# Patient Record
Sex: Female | Born: 1935 | Race: White | Hispanic: No | Marital: Married | Smoking: Former smoker
Health system: Southern US, Community
[De-identification: ages and names within clinical notes are randomized; demographics above are authoritative.]

## PROBLEM LIST (undated history)

## (undated) ENCOUNTER — Emergency Department (HOSPITAL_COMMUNITY): Admission: EM | Payer: Medicare Other | Source: Home / Self Care

## (undated) DIAGNOSIS — K219 Gastro-esophageal reflux disease without esophagitis: Secondary | ICD-10-CM

## (undated) DIAGNOSIS — I219 Acute myocardial infarction, unspecified: Secondary | ICD-10-CM

## (undated) DIAGNOSIS — N183 Chronic kidney disease, stage 3 unspecified: Secondary | ICD-10-CM

## (undated) DIAGNOSIS — D649 Anemia, unspecified: Secondary | ICD-10-CM

## (undated) DIAGNOSIS — I779 Disorder of arteries and arterioles, unspecified: Secondary | ICD-10-CM

## (undated) DIAGNOSIS — N186 End stage renal disease: Secondary | ICD-10-CM

## (undated) DIAGNOSIS — I150 Renovascular hypertension: Secondary | ICD-10-CM

## (undated) DIAGNOSIS — I2510 Atherosclerotic heart disease of native coronary artery without angina pectoris: Secondary | ICD-10-CM

## (undated) DIAGNOSIS — I739 Peripheral vascular disease, unspecified: Secondary | ICD-10-CM

## (undated) DIAGNOSIS — E785 Hyperlipidemia, unspecified: Secondary | ICD-10-CM

## (undated) DIAGNOSIS — Z951 Presence of aortocoronary bypass graft: Secondary | ICD-10-CM

## (undated) DIAGNOSIS — R942 Abnormal results of pulmonary function studies: Secondary | ICD-10-CM

## (undated) DIAGNOSIS — I420 Dilated cardiomyopathy: Secondary | ICD-10-CM

## (undated) DIAGNOSIS — J189 Pneumonia, unspecified organism: Secondary | ICD-10-CM

## (undated) DIAGNOSIS — I10 Essential (primary) hypertension: Secondary | ICD-10-CM

## (undated) DIAGNOSIS — I509 Heart failure, unspecified: Secondary | ICD-10-CM

## (undated) DIAGNOSIS — I4891 Unspecified atrial fibrillation: Secondary | ICD-10-CM

## (undated) HISTORY — DX: Atherosclerotic heart disease of native coronary artery without angina pectoris: I25.10

## (undated) HISTORY — PX: TONSILLECTOMY: SUR1361

## (undated) HISTORY — PX: ABDOMINAL HYSTERECTOMY: SHX81

## (undated) HISTORY — DX: Hyperlipidemia, unspecified: E78.5

## (undated) HISTORY — DX: Peripheral vascular disease, unspecified: I73.9

## (undated) HISTORY — PX: BACK SURGERY: SHX140

## (undated) HISTORY — DX: Dilated cardiomyopathy: I42.0

## (undated) HISTORY — DX: Presence of aortocoronary bypass graft: Z95.1

## (undated) HISTORY — DX: Essential (primary) hypertension: I10

## (undated) HISTORY — DX: End stage renal disease: N18.6

## (undated) HISTORY — PX: COLONOSCOPY: SHX174

## (undated) HISTORY — DX: Abnormal results of pulmonary function studies: R94.2

## (undated) HISTORY — PX: APPENDECTOMY: SHX54

## (undated) HISTORY — PX: OTHER SURGICAL HISTORY: SHX169

## (undated) HISTORY — DX: Renovascular hypertension: I15.0

---

## 1979-05-24 HISTORY — PX: OTHER SURGICAL HISTORY: SHX169

## 1995-05-24 DIAGNOSIS — I739 Peripheral vascular disease, unspecified: Secondary | ICD-10-CM

## 1995-05-24 HISTORY — PX: OTHER SURGICAL HISTORY: SHX169

## 1995-05-24 HISTORY — DX: Peripheral vascular disease, unspecified: I73.9

## 2002-06-04 ENCOUNTER — Other Ambulatory Visit: Admission: RE | Admit: 2002-06-04 | Discharge: 2002-06-04 | Payer: Self-pay | Admitting: Obstetrics and Gynecology

## 2003-07-30 ENCOUNTER — Other Ambulatory Visit: Admission: RE | Admit: 2003-07-30 | Discharge: 2003-07-30 | Payer: Self-pay | Admitting: Obstetrics and Gynecology

## 2006-02-27 ENCOUNTER — Encounter (INDEPENDENT_AMBULATORY_CARE_PROVIDER_SITE_OTHER): Payer: Self-pay | Admitting: Specialist

## 2006-02-27 ENCOUNTER — Ambulatory Visit (HOSPITAL_COMMUNITY): Admission: RE | Admit: 2006-02-27 | Discharge: 2006-02-27 | Payer: Self-pay | Admitting: Gastroenterology

## 2006-04-22 HISTORY — PX: CARDIAC CATHETERIZATION: SHX172

## 2006-04-22 HISTORY — PX: CORONARY ARTERY BYPASS GRAFT: SHX141

## 2006-04-27 ENCOUNTER — Inpatient Hospital Stay (HOSPITAL_COMMUNITY): Admission: RE | Admit: 2006-04-27 | Discharge: 2006-05-01 | Payer: Self-pay | Admitting: Surgery

## 2006-05-30 ENCOUNTER — Encounter: Admission: RE | Admit: 2006-05-30 | Discharge: 2006-05-30 | Payer: Self-pay | Admitting: Surgery

## 2006-06-01 ENCOUNTER — Encounter (HOSPITAL_COMMUNITY): Admission: RE | Admit: 2006-06-01 | Discharge: 2006-08-30 | Payer: Self-pay | Admitting: Cardiology

## 2006-08-31 ENCOUNTER — Encounter (HOSPITAL_COMMUNITY): Admission: RE | Admit: 2006-08-31 | Discharge: 2006-09-01 | Payer: Self-pay | Admitting: Cardiology

## 2007-05-15 ENCOUNTER — Ambulatory Visit: Payer: Self-pay | Admitting: Vascular Surgery

## 2008-05-09 ENCOUNTER — Ambulatory Visit: Payer: Self-pay | Admitting: Vascular Surgery

## 2009-07-09 ENCOUNTER — Ambulatory Visit: Payer: Self-pay | Admitting: Vascular Surgery

## 2009-09-11 ENCOUNTER — Emergency Department (HOSPITAL_COMMUNITY): Admission: EM | Admit: 2009-09-11 | Discharge: 2009-09-11 | Payer: Self-pay | Admitting: Emergency Medicine

## 2010-06-13 ENCOUNTER — Encounter: Payer: Self-pay | Admitting: Surgery

## 2010-07-02 ENCOUNTER — Ambulatory Visit: Payer: Self-pay

## 2010-07-02 ENCOUNTER — Other Ambulatory Visit: Payer: Self-pay

## 2010-07-08 ENCOUNTER — Other Ambulatory Visit: Payer: Self-pay

## 2010-07-08 ENCOUNTER — Ambulatory Visit: Payer: Self-pay

## 2010-08-19 ENCOUNTER — Encounter (INDEPENDENT_AMBULATORY_CARE_PROVIDER_SITE_OTHER): Payer: Medicare Other

## 2010-08-19 ENCOUNTER — Other Ambulatory Visit (INDEPENDENT_AMBULATORY_CARE_PROVIDER_SITE_OTHER): Payer: Medicare Other

## 2010-08-19 ENCOUNTER — Ambulatory Visit (INDEPENDENT_AMBULATORY_CARE_PROVIDER_SITE_OTHER): Payer: Medicare Other

## 2010-08-19 DIAGNOSIS — I6529 Occlusion and stenosis of unspecified carotid artery: Secondary | ICD-10-CM

## 2010-08-19 DIAGNOSIS — I739 Peripheral vascular disease, unspecified: Secondary | ICD-10-CM

## 2010-08-19 DIAGNOSIS — I70219 Atherosclerosis of native arteries of extremities with intermittent claudication, unspecified extremity: Secondary | ICD-10-CM

## 2010-08-19 DIAGNOSIS — Z48812 Encounter for surgical aftercare following surgery on the circulatory system: Secondary | ICD-10-CM

## 2010-08-19 NOTE — Assessment & Plan Note (Signed)
OFFICE VISIT  Patricia, Hebert DOB:  26-Sep-1935                                       08/19/2010 [id number]#:[id number]  Patient is here today for follow-up of her right fem-pop bypass, which was done in December 1997 by Dr. Hart Rochester.  She had bypass done with nonreversed translocated greater saphenous vein.  She denies any symptoms of limiting claudication.  She states she cannot walk for long distances without getting a little bit of calf pain.  Otherwise she is doing quite well with her normal everyday activities.  She denies any rest pain, night pain.  She has been having issues with cholesterol- lowering medications.  Some, she is having a more severe reaction than others.  She is still on Zetia, and her cholesterol seems to be well- controlled.  The patient also states that some of her limitations at long distances are because of her coronary artery disease and shortness of breath with exertion.  Otherwise she is very active and doing well. We also follow her right carotid stenosis.  She denies any signs or symptoms of stroke, TIA, or amaurosis.  VASCULAR LAB:  Her carotid ultrasound was stable with a 40% to 59% stenosis in the right carotid with an end-diastolic velocity of 40 and no significant hemodynamic changes on the left.  Her ABI's, the right is 1, and the left is 0.87.  PHYSICAL EXAMINATION:  This is a well-developed, well-nourished woman in no acute distress.  She is alert and oriented x3.  She ambulates with a normal gait.  Vital signs:  Her heart rate is 76.  Her sats are 100, and respiratory rate is 10.  Bilateral lower extremities are warm and pink. She has a palpable dorsalis pedis pulse on the right and a monophasic Doppler signal in the DP and PT on the left.  She has no skin ulcers or skin breakdown in either lower extremity.  In listening to her neck, she has no carotid bruits.  Neurologically, she is alert and oriented x3. She has  good and equal strength in bilateral upper and lower extremities but no facial droop.  No tongue deviation.  ASSESSMENT/PLAN:  Widely patent right femoropopliteal bypass done in 1997 with ABIs of 1 and no history of claudication.  She does have some occasionally claudication on the left with very long distances.  Her carotid stenosis on the right is stable at 40% to 59%, and she is asymptomatic.  Patricia Goo, PA-C  Patricia E. Fields, MD Electronically Signed  RR/MEDQ  D:  08/19/2010  T:  08/19/2010  Job:  [id number]

## 2010-08-19 NOTE — Assessment & Plan Note (Signed)
OFFICE VISIT  [name], [name] DOB:  1935-12-16                                       08/19/2010 [id number]#:[id number]  ADDENDUM:  ASSESSMENT:  Stable right femoropopliteal bypass and stable carotid disease on the right at 40% to 59% which is asymptomatic.  PLAN:  Have the patient come back in 1 year for ABIs and also carotid studies.  Della Goo, PA-C  Charles E. Fields, MD Electronically Signed  RR/MEDQ  D:  08/19/2010  T:  08/19/2010  Job:  [id number]

## 2010-08-26 NOTE — Procedures (Unsigned)
CAROTID DUPLEX EXAM  INDICATION:  Follow up carotid artery disease.  HISTORY: Diabetes:  No. Cardiac:  CAD, MI. Hypertension:  Yes. Smoking:  Previous. Previous Surgery:  No. CV History:  Currently asymptomatic. Amaurosis Fugax No, Paresthesias No, Hemiparesis No.                                      RIGHT             LEFT Brachial systolic pressure:         157               153 Brachial Doppler waveforms:         WNL               WNL Vertebral direction of flow:        Antegrade         Antegrade DUPLEX VELOCITIES (cm/sec) CCA peak systolic                   80                75 ECA peak systolic                   126               137 ICA peak systolic                   130               85 ICA end diastolic                   40                22 PLAQUE MORPHOLOGY:                  Heterogenous      Heterogenous PLAQUE AMOUNT:                      Moderate          Moderate PLAQUE LOCATION:                    CCA/ICA/ECA       CCA/ICA/ECA  IMPRESSION: 1. 40% to 59% stenosis, right internal carotid artery. 2. Left internal carotid artery shows no evidence of hemodynamically     significant stenosis. 3. Bilateral intimal thickening in the common carotid arteries. 4. Study is stable compared to previous.  ___________________________________________ Quita Skye Hart Rochester, M.D.  OD/MEDQ  D:  08/20/2010  T:  08/20/2010  Job:  [id number]

## 2010-10-05 NOTE — Procedures (Signed)
CAROTID DUPLEX EXAM   INDICATION:  Follow-up carotid artery disease.   HISTORY:  Diabetes:  No.  Cardiac:  CAD, MI.  Hypertension:  Yes.  Smoking:  Quit.  Previous Surgery:  Right femoral to popliteal artery bypass on  04/29/1996 by Dr. Hart Rochester.  CV History:  No.  Amaurosis Fugax No, Paresthesias No, Hemiparesis No.                                       RIGHT             LEFT  Brachial systolic pressure:         178               186  Brachial Doppler waveforms:         Within normal limits                Within normal limits  Vertebral direction of flow:        Antegrade         Antegrade  DUPLEX VELOCITIES (cm/sec)  CCA peak systolic                   109               121  ECA peak systolic                   146               187  ICA peak systolic                   172               121  ICA end diastolic                   35                32  PLAQUE MORPHOLOGY:                  Heterogeneous     Heterogeneous  PLAQUE AMOUNT:                      Moderate          Moderate  PLAQUE LOCATION:                    CCA, BIF, ECA and ICA               CCA, BIF, ECA and ICA   IMPRESSION:  1. Duplex shows evidence of 40-59% stenosis of the right internal      carotid artery.  2. Duplex shows evidence of 40-59% stenosis of the left internal      carotid artery.   ___________________________________________  Quita Skye Hart Rochester, M.D.   AC/MEDQ  D:  05/09/2008  T:  05/09/2008  Job:  [id number]

## 2010-10-05 NOTE — Procedures (Signed)
CAROTID DUPLEX EXAM   INDICATION:  Carotid disease.   HISTORY:  Diabetes:  No.  Cardiac:  CAD, MI.  Hypertension:  Yes.  Smoking:  Previous.  Previous Surgery:  No carotid surgeries.  CV History:  Currently asymptomatic.  Amaurosis Fugax No, Paresthesias No, Hemiparesis No                                       RIGHT             LEFT  Brachial systolic pressure:         147               148  Brachial Doppler waveforms:         Normal            Normal  Vertebral direction of flow:        Antegrade         Antegrade  DUPLEX VELOCITIES (cm/sec)  CCA peak systolic                   95                101  ECA peak systolic                   143               155  ICA peak systolic                   152               96  ICA end diastolic                   30                23  PLAQUE MORPHOLOGY:                  Mixed             Mixed  PLAQUE AMOUNT:                      Moderate          Mild  PLAQUE LOCATION:                    ICA / ECA / CCA   ICA / ECA / CCA   IMPRESSION:  1. 40%-59% stenosis of the right internal carotid artery.  2. 1%-39% stenosis of the left internal carotid artery.  3. Stable bilateral carotid artery Doppler velocities noted when      compared to the previous exam on 05/09/2008 and with other prior      studies.   ___________________________________________  Quita Skye Hart Rochester, M.D.   CH/MEDQ  D:  07/09/2009  T:  07/09/2009  Job:  [id number]

## 2010-10-05 NOTE — Procedures (Signed)
BYPASS GRAFT EVALUATION   INDICATION:  Follow-up right lower extremity bypass graft.   HISTORY:  Diabetes:  No.  Cardiac:  CAD, MI.  Hypertension:  Yes.  Smoking:  No.  Previous Surgery:  Right femoral-to-popliteal artery bypass graft on  04/29/1996 by Dr. Hart Rochester.   SINGLE LEVEL ARTERIAL EXAM                               RIGHT              LEFT  Brachial:                    178                186  Anterior tibial:             175                157  Posterior tibial:            173                154  Peroneal:  Ankle/brachial index:        0.94               0.84   PREVIOUS ABI:  Date: 05/15/2007  RIGHT:  1.0  LEFT:  0.77   LOWER EXTREMITY BYPASS GRAFT DUPLEX EXAM:   DUPLEX:  1. Patent right femoral-to-popliteal artery bypass with no evidence of      stenosis.  2. Triphasic duplex waveform noted throughout the graft in outflow      artery.   IMPRESSION:   ___________________________________________  Quita Skye. Hart Rochester, M.D.   AC/MEDQ  D:  05/09/2008  T:  05/09/2008  Job:  [id number]

## 2010-10-05 NOTE — Procedures (Signed)
BYPASS GRAFT EVALUATION   INDICATION:  Followup, right lower extremity bypass graft.   HISTORY:  Diabetes:  No.  Cardiac:  CAD, MI.  Hypertension:  Yes.  Smoking:  No.  Previous Surgery:  Right femoral-popliteal artery bypass graft on  04/29/1996 by Dr. Hart Rochester.   SINGLE LEVEL ARTERIAL EXAM                               RIGHT              LEFT  Brachial:                    159                166  Anterior tibial:             159                149  Posterior tibial:            157                148  Peroneal:                    166                152  Ankle/brachial index:        1.0                0.92   PREVIOUS ABI:  Date: 04/25/2006  RIGHT:  0.89  LEFT:  0.77   LOWER EXTREMITY BYPASS GRAFT DUPLEX EXAM:   DUPLEX:  Doppler arterial waveforms appear triphasic proximal to,  within, and distal to bypass graft.   IMPRESSION:  1. Bilateral ankle brachial indices appear stable, slightly improved      from previous study.  2. Right femoral-popliteal artery bypass graft appears patent.   ___________________________________________  Quita Skye. Hart Rochester, M.D.   AS/MEDQ  D:  05/15/2007  T:  05/16/2007  Job:  [id number]

## 2010-10-05 NOTE — Procedures (Signed)
CAROTID DUPLEX EXAM   INDICATION:  Followup, carotid artery disease.   HISTORY:  Diabetes:  No.  Cardiac:  CAD, MI.  Hypertension:  Yes.  Smoking:  Quit.  Previous Surgery:  CV History:  No.  Amaurosis Fugax No, Paresthesias No, Hemiparesis No                                       RIGHT             LEFT  Brachial systolic pressure:         159               166  Brachial Doppler waveforms:         Triphasic         Triphasic  Vertebral direction of flow:        Antegrade         Antegrade  DUPLEX VELOCITIES (cm/sec)  CCA peak systolic                   90                87  ECA peak systolic                   131               177  ICA peak systolic                   143               108  ICA end diastolic                   39                31  PLAQUE MORPHOLOGY:                  Calcified         Calcified  PLAQUE AMOUNT:                      Mild/moderate     Mild  PLAQUE LOCATION:                    ICA/ECA           ICA/ECA/CCA   IMPRESSION:  1. The right internal carotid artery shows evidence of 40-59% stenosis      (low end of range).  2. The left internal carotid artery shows evidence of 20-39% stenosis      (high end of range).  3. Left external carotid artery stenosis.  4. No significant changes from previous study on April 25, 2006.   ___________________________________________  Quita Skye. Hart Rochester, M.D.   AS/MEDQ  D:  05/15/2007  T:  05/16/2007  Job:  [id number]

## 2010-10-08 NOTE — Discharge Summary (Signed)
[name], [name]           ACCOUNT NO.:  [id number]   MEDICAL RECORD NO.:  [id number]          PATIENT TYPE:  INP   LOCATION:  2002                         FACILITY:  [facility]   PHYSICIAN:  Evelene Croon, M.D.     DATE OF BIRTH:  26-Sep-1935   DATE OF ADMISSION:  04/27/2006  DATE OF DISCHARGE:  04/30/2006                               DISCHARGE SUMMARY   PRIMARY ADMITTING DIAGNOSIS:  Severe vessel three-vessel coronary artery  disease.   ADDITIONAL/DISCHARGE DIAGNOSES:  1. Severe three-vessel coronary artery disease.  2. Hypertension.  3. Hyperlipidemia.  4. Peripheral vascular occlusive disease.  5. Remote history of tobacco abuse.  6. History of pericarditis in the past.   PROCEDURES:  1. Coronary artery bypass grafting x4 (left internal mammary artery to      the LAD, saphenous vein graft to the distal right coronary artery,      saphenous vein graft to the intermediate, saphenous vein graft to      diagonal).  2. Endoscopic vein harvest, left leg.   HISTORY:  The patient is a 76 year old white female who recently has  undergone several episodes of substernal chest pain and shortness of  breath occurring with exertion.  She underwent a Cardiolite scan which  showed inferolateral scar with mild peri-infarct ischemia.  She  subsequently underwent cardiac catheterization by Dr. Aleen Campi on  April 06, 2006, which showed severe three-vessel coronary artery  disease with a preserved left ventricular ejection fraction of 60%.  She  also was noted to have a 60-70% bilateral renal artery stenosis by  abdominal aortic arteriogram.  Because of these findings and her  symptoms, she was referred to Dr. Evelene Croon for consideration of  surgical revascularization.  Dr. Laneta Simmers exam the patient and reviewed  her films and felt that her best course of action would be to proceed  with surgical revascularization.  He explained the risks, benefits and  alternatives of the procedure to  the patient and she agreed to proceed  with surgery.   HOSPITAL COURSE:  [name] was admitted to [facility] on  April 27, 2006 and was taken to the operating room, where she  underwent CABG x4 as described in detail above, performed by Dr. Laneta Simmers.  She tolerated the procedure well and was transferred to the SICU in  stable condition.  Postoperatively, she has done well.  She was  extubated shortly after surgery.  Post-op day #1, her chest tubes and  hemodynamic monitoring devices were removed, and she was able to be  transferred to the floor.  She did have mild postoperative blood loss  anemia, which was treated conservatively and improved.  She has made  slow and steady progress, during her postoperative course.  She has been  started on a beta blocker, and the dose has been titrated upward.  She  also has been somewhat volume overloaded and has been aggressively  diuresed.  Her incisions are all healing well.  She has remained  afebrile, and all vital signs have been stable.  She has been ambulating  in the halls without difficulty and is tolerating  a regular diet.   LABORATORY:  Her most recent labs show a hemoglobin of 10, hematocrit  28.9, platelets 153, white count 13.8, sodium 133, potassium 3.9 which  has been supplemented, BUN 18, creatinine 1.2.  Her chest x-ray showed  bibasilar atelectasis and effusions.  A decision was made to hold off on  starting any ACE inhibitor  angiotensin receptor blocker, secondary to  her renal artery stenoses.  This will be followed up as an outpatient.  It is felt that if she continues to remain stable over the next 24  hours, she will hopefully be ready for discharge home by May 01, 2006.   DISCHARGE MEDICATIONS:  Are as follows:  1. Aspirin 325 mg daily.  2. Metoprolol 25 mg b.i.d.  3. Crestor 10 mg daily.  4. Lasix 40 mg daily x1 week.  5. Potassium 20 mEq daily x1 week.  6. She is to continue her home doses of  Prilosec, multivitamin, I-Vite      and Exforge.  7. Tylox 1 to 2, q.4 h. p.r.n. for pain.   DISCHARGE INSTRUCTIONS:  She is asked to refrain from driving, heavy  lifting or strenuous activity.  She may continue ambulating daily and  using her incentive spirometer.  She may shower daily and clean her  incisions with soap and water.  She will continue her same pre-operative  diet.   DISCHARGE FOLLOWUP:  She will need to make an appointment see Dr.  Aleen Campi in 2 weeks.  She will then follow up in the [facility] office in 3  weeks, and our office will contact her with an appointment.  In the  interim, if she experiences any problems or has questions, she is asked  to contact our office immediately.      Coral Ceo, P.A.      Evelene Croon, M.D.  Electronically Signed    GC/MEDQ  D:  04/30/2006  T:  04/30/2006  Job:  [id number]   cc:   Antionette Char, MD  Brooke Bonito, M.D.  [facility] Office

## 2010-10-08 NOTE — Op Note (Signed)
NAME:  [name], BRAU           ACCOUNT NO.:  [id number]   MEDICAL RECORD NO.:  [id number]          PATIENT TYPE:  AMB   LOCATION:  ENDO                         FACILITY:  [facility]   PHYSICIAN:  Anselmo Rod, M.D.  DATE OF BIRTH:  Sep 04, 1935   DATE OF PROCEDURE:  02/27/2006  DATE OF DISCHARGE:                                 OPERATIVE REPORT   PROCEDURE:  Colonoscopy with snare polypectomy x2 and cold biopsies x14.   ENDOSCOPIST:  Anselmo Rod, M.D.   INSTRUMENT USED:  Olympus video colonoscope.   INDICATIONS FOR PROCEDURE:  A 75 year old white female underwent a screening  colonoscopy to rule out colonic polyps, masses, etc.   PREPROCEDURE PREPARATION:  Informed consent was procured from the patient.  The patient fasted for 4 hours prior to the procedure and prepped with 20  OsmoPrep pills the night of and 12 OsmoPrep pills the morning of the  procedure. The risks and benefits of the procedure including a 10% missed  rate of cancer and polyp were discussed with the patient as well.   PREPROCEDURE PHYSICAL:  The patient had stable vital signs. Neck supple.  Chest clear to auscultation. S1, S2 regular. Abdomen soft with normal bowel  sounds.   DESCRIPTION OF PROCEDURE:  The patient was placed in the left lateral  decubitus position, sedated with 100 mcg of fentanyl and 7.5 mg of Versed in  slow incremental doses given intravenously. Once the patient was adequately  sedated and maintained on low flow oxygen and continuous cardiac monitoring,  the Olympus video colonoscope was advanced from the rectum to the cecum.  Multiple washings were done, a nodular lesion was noted in the cecal base  close to the appendiceal orifice that was removed by a snare polypectomy x2  and cold biopsies x4. Two polyps were biopsied from the rectum (cold  biopsies x4). A small sessile polyp was biopsied over the IC valve x4. There  were a few early scattered diverticula noted throughout the  colon,  retroflexion in the rectum revealed no abnormalities. The patient tolerated  the procedure well without complications.   IMPRESSION:  1. Rectal polyps biopsied.  2. Scattered diverticulosis.  3. Small polyp biopsied over the ileocecal valve.  4. Nodular lesion snared and biopsied from the cecal base.   RECOMMENDATIONS:  1. Await pathology results.  2. Avoid all nonsteroidals including aspirin for the next 2 days.  3. Brochures on diverticulosis have been given to the patient for      education.  4. Repeat colonoscopy depending on pathology results.  5. Outpatient followup as need arises in the future.      Anselmo Rod, M.D.  Electronically Signed     JNM/MEDQ  D:  02/27/2006  T:  02/28/2006  Job:  [id number]   cc:   Brooke Bonito, M.D.  Cynthia P. Romine, M.D.

## 2010-10-08 NOTE — H&P (Signed)
[name], [name]           ACCOUNT NO.:  [id number]   MEDICAL RECORD NO.:  [id number]          PATIENT TYPE:  INP   LOCATION:  NA                           FACILITY:  [facility]   PHYSICIAN:  [name], M.D.     DATE OF BIRTH:  18-Oct-1935   DATE OF ADMISSION:  04/26/2006  DATE OF DISCHARGE:                              HISTORY & PHYSICAL   REASON FOR ADMISSION:  Severe three-vessel coronary artery disease.   CLINICAL HISTORY:  This patient is a 75 year old white female referred  by Dr. Charolette Hebert for consideration of coronary bypass graft surgery.  She has a history of hypertension and hyperlipidemia as well as  peripheral vascular disease and presented with several episodes of  substernal chest pain and shortness of breath occurring with exertion.  She underwent a Cardiolite scan on 03/30/06 which showed inferolateral  scar with mild peri-infarct ischemia.  Review of her electrocardiogram  since 1997 showed loss of inferior forces from 12/2005 to 03/2006.  She  underwent cardiac catheterization by Dr. Aleen Hebert on 04/06/06 at the  [facility].  This showed severe three-vessel  disease.  The LAD has 90% focal mid-vessel stenosis.  The first diagonal  branch has a large vessel at 90% ostial stenosis.  The left circumflex  gave off a large intermediate or first marginal that had 90% proximal  stenosis and an 80% mid stenosis.  The remainder of the left circumflex  had no significant disease.  The right coronary artery had a 50% ostial  lesion and then diffuse 90% proximal stenosis.  It was occluded in the  mid segment just beyond the right ventricular branch with bridging  collaterals from the mid right coronary artery and from the left filling  the distal vessel faintly.  The left internal mammary artery appeared  normal.  Abdominal aortic __________ showed 60-70% bilateral renal  artery stenosis at the origins.  Left ventricular ejection fraction was  60% with no mitral regurgitation and no gradient across the aortic  valve.   HER REVIEW OF SYSTEMS IS AS FOLLOWS:  GENERAL:  She denies any fever or  chills.  She has had some recent weight gain due to inactivity.  She  denies fatigue.  EYES:  She has had cataract surgery.  ENT:  Negative.  ENDOCRINE:  She denies diabetes and hypothyroidism.  CARDIOVASCULAR:  She does report intermittent substernal chest pressure  and pain as well as shortness of breath with exertion.  She has had no  PND or orthopnea.  She denies palpitations.  She denies peripheral  edema.  RESPIRATORY:  She has had some productive cough several weeks ago and  was treated with 14 days of amoxicillin with resolution of that.  She  subsequently developed a cold which has resolved.  GI:  She has reflux symptoms.  She denies nausea or vomiting.  She has  had no dysphagia.  She denies melena and bright red blood per rectum.  GU:  She denies dysuria and hematuria.  VASCULAR:  She denies claudication and phlebitis.  She did have a right  femoral popliteal bypass in  the past by Dr. Hart Hebert.  NEUROLOGICAL:  She denies any focal weakness or numbness.  She has had  some dizziness.  She has never had a TIA or a stroke.  MUSCULOSKELETAL:  She does have arthralgias and some muscle pain.  PSYCHIATRIC:  Negative.  HEMATOLOGICAL:  Negative.   ALLERGIES:  SHE DOES HAVE POSSIBLE ALLERGY TO LATEX HAVING HAD RASH  DEVELOP ON HER HANDS AFTER USING LATEX GLOVES.  SHE HAS NEVER HAD  ANAPHYLAXIS WITH LATEX.   PAST MEDICAL HISTORY:  Significant for:  1. Cataract surgery in the past.  2. She is status post surgery for a rectovaginal fistula.  3. She is status post microdiskectomy on her lower back.  4. She is status post right SFA to below-knee popliteal bypass using      saphenous vein in December 1997 by Dr. Hart Hebert.  5. She is status post total hysterectomy in 1981.  6. She is status post tubal ligation.  7. She is status post  appendectomy.  8. Status post tonsillectomy.  9. She has a history of pericarditis in the past.  10.She also has a history of hypercholesterolemia and hypertension as      mentioned above.   SOCIAL HISTORY:  She is retired.  She lives with her husband.  She quit  smoking about 14 years ago.  She drinks about 2 glasses of red wine per  day.   FAMILY HISTORY:  Negative for coronary artery disease.  One brother was  born with a patent duct that was closed.   MEDICATIONS:  1. Exforge 5 mg daily.  2. HCTZ 25 mg daily.  3. Lopressor ER 25 mg b.i.d.  4. Crestor 10 mg daily.  5. Aspirin 325 mg daily.  6. Omega-3 fish oil, 1000 mg daily.  7. Centrum Silver daily.  8. Prilosec daily.  9. Eyerite protect, 2 daily.  10.Sublingual nitroglycerine p.r.n.  11.Betamethasone/DP 0.05% cream p.r.n.   ON PHYSICAL EXAMINATION:  VITAL SIGNS:  Blood pressure 162/70.  Pulse 88  and regular.  Respiratory rate 18 and unlabored.  GENERAL:  She is a well-developed white female in no distress.  HEENT EXAM:  Head is normocephalic and atraumatic.  Pupils are equal and  reactive to light and accommodation.  Extraocular muscles are intact.  Her throat is clear.  NECK EXAM:  Shows normal carotid pulses bilaterally.  There are no  bruits.  There is no adenopathy or thyromegaly.  CARDIAC EXAM:  Shows regular rate and rhythm with normal S1 and S2.  There is no murmur or gallop.  LUNGS:  Clear.  ABDOMINAL EXAM:  Shows active bowel sounds.  Abdomen is soft, mildly  obese, and nontender.  There are no palpable masses or organomegaly.  EXTREMITY EXAM:  Shows no peripheral edema.  Right leg has several scars  from her peripheral bypass surgery.  Pedal pulses are palpable  bilaterally.  SKIN:  Warm and dry.  NEUROLOGIC EXAM:  Shows her to be alert and oriented x3.  Motor and  sensory exams grossly normal. PERIPHERAL VASCULAR EXAM:  Shows an API of 0.97 on the right and 0.79 on  the left.  Her carotid Doppler exam  shows a 40-59% right internal  carotid artery stenosis and 1-39% left internal carotid artery stenosis.  Upper extremity arterial flow is normal.   IMPRESSION:  [name] has severe three-vessel coronary disease with  a large __________ myocardium at risk.  She has had recurrent anginal  symptoms and a positive Cardiolite scan.  I agree that coronary artery  bypass graft surgery is the best treatment to prevent further ischemia  and infarction.  She has had right lower extremity bypass with vein, so  we would only be able to use her left leg vein.  I discussed the  procedure with her and her husband including alternatives, benefits, and  risks including but not limited to bleeding, blood transfusion,  infection, stroke, myocardial infarction, graft failure, and death.  I  also told her that if her left leg vein was not suitable, we may need to  use her left radial artery and possibly bilateral internal mammary  grafts to provide adequate vascularization.  She understands all of this  and agrees to proceed.  We will schedule this for April 27, 2006.      [name], M.D.  Electronically Signed     BB/MEDQ  D:  04/26/2006  T:  04/26/2006  Job:  [id number]   cc:   Antionette Char, MD

## 2010-10-08 NOTE — Op Note (Signed)
NAMENEILA, TEEM           ACCOUNT NO.:  [id number]   MEDICAL RECORD NO.:  [id number]          PATIENT TYPE:  INP   LOCATION:  2002                         FACILITY:  [facility]   PHYSICIAN:  Evelene Croon, M.D.     DATE OF BIRTH:  Aug 18, 1935   DATE OF PROCEDURE:  04/27/2006  DATE OF DISCHARGE:                               OPERATIVE REPORT   PREOPERATIVE DIAGNOSIS:  Severe three-vessel coronary artery disease.   POSTOPERATIVE DIAGNOSIS:  Severe three-vessel coronary artery disease.   OPERATIVE PROCEDURES:  Median sternotomy, extracorporeal circulation,  coronary artery bypass graft surgery times four using a left internal  mammary artery graft to the left anterior descending coronary artery  with a saphenous vein graft to the diagonal branch of the left anterior  descending, a saphenous vein graft to the intermediate coronary artery  and a saphenous vein graft to the right coronary artery; endoscopic vein  harvesting from the left leg.   ATTENDING SURGEON:  Evelene Croon, M.D.   ASSISTANT:  Stephanie Acre Dominick, PA-C.   ANESTHESIA:  General endotracheal.   CLINICAL HISTORY:  This patient is a 75 year old woman, who has been  followed by Dr. Charolette Child with a history of hypertension and  hyperlipidemia, as well as peripheral vascular disease.  She presented  with several episodes of substernal chest pain and shortness of breath  with exertion.  She had a Cardiolite scan on 03/30/2006 that showed  inferolateral scar with mild peri-infarct ischemia.  She underwent  cardiac catheterization on 03/27/2006, which showed severe 3-vessel  disease.  The LAD had 90% focal midvessel stenosis.  A large first  diagonal branch had a 90% ostial stenosis.  The left circumflex gave off  a large intermediate or first marginal that had a 90% proximal stenosis  and an 80% midstenosis.  The remainder of the left circumflex had no  significant disease.  The right coronary artery had 50% ostial  stenosis,  and then diffuse 90% proximal stenosis, and was occluded in the  midsegment just beyond the right ventricular branch, with bridging  collaterals showing the distal vessel on the right, we are anticipating  some collaterals on the left.  The left internal mammary artery appeared  to be normal.  Abdominal aortogram showed 60 the 70% bilateral renal  artery stenosis at the origins.  Ejection fraction was 60% with no  mitral regurgitation and no gradient across the aortic valve.  After  review of the angiogram and examination of the patient, it was felt that  coronary artery bypass grafting was the best treatment to prevent  further ischemia and infarction.  I discussed the operative procedure  with the patient and her husband, including alternatives, benefits and  risks, including bleeding, blood transfusion, infection, stroke,  myocardial infarction, graft failure and death.  They understood and  agreed to proceed.   DESCRIPTION OF OPERATIVE PROCEDURES:  The patient was taken to the  operating room and placed on the table in supine position.  After the  induction of general endotracheal anesthesia, a Foley catheter was  placed in the bladder using sterile technique.  Then, the chest,  abdomen  and both the lower extremities were prepped and draped in the usual  sterile manner.  The patient did give a history of a Latex allergy with  a rash on her hands after wearing Latex gloves; and therefore, a Latex-  free protocol was used.   Then, the chest was opened through a median sternotomy incision.  The  pericardium was opened in the midline.  Examination of the heart showed  good ventricular contractility.  The ascending aorta had no palpable  plaques in it.   Then, the left internal mammary artery was harvested from the chest wall  as a pedicle graft.  This was a medium-caliber vessel with excellent  blood flow through it.  At the same time, a segment of greater saphenous  vein  was harvested from the left leg using endoscopic vein harvest  technique.  This vein was of medium caliber and good quality.   Then, the patient was heparinized, and when an adequate activated  clotting time was achieved, the distal ascending aorta was cannulated  using a 20-French aortic cannula for arterial inflow.  Venous outflow  was achieved using a 2-stage venous cannula through the right atrial  appendage.  An antegrade cardioplegia and vent cannula was inserted in  the aortic root.   The patient was then placed on cardiopulmonary bypass and the distal  coronaries identified.  The LAD was a large graftable vessel.  The  diagonal was a large graftable vessel.  The intermediate was a large  vessel that was intramyocardial along its entire extent.  It was visible  running on the surface of the muscle.  It had no distal disease in it.  The right coronary artery was heavily diseased proximally, but distally,  beyond the acute margin, the vessel was fairly soft, without significant  disease.   Then, the aorta was crossclamped and 1000 cc of cold-blood antegrade  cardioplegia was administered into the aortic root with quick arrest of  the heart.  Systemic hypothermia to 28 degrees Celsius and topical  hypothermia with ice saline were used.  A temperature probe was placed  in the septum and an insulating pad on the pericardium.   Then, the first distal anastomosis was performed at the intermediate  coronary artery.  The internal diameter was about 2.5 mm.  The conduit  used was a segment of greater saphenous vein.  The anastomosis was  performed in end-to-side using continuous 7-0 Prolene suture.  Flow was  measured through the graft and was excellent.   The second distal anastomosis was performed to the distal right coronary  artery.  The internal diameter was greater than 2.5 mm.  The probe passed distally without any problem.  The conduit used was a second  segment of greater  saphenous vein, and the anastomosis performed in an  end-to-side manner using continuous 7-0 Prolene suture.  Flow was  measured through the graft and was excellent.  Then, another dose of  cardioplegia was given down the vein grafts and the aortic root.   A third distal anastomosis was performed to the diagonal branch.  The  internal diameter of this vessel was about 1.75 mm.  The conduit used  was a third segment of greater saphenous vein, and the anastomosis  performed in an end-to-side manner using continuous 7-0 Prolene suture.  Flow was measured through the graft and was excellent.   The fourth distal anastomosis was performed to the midportion of the  left anterior descending coronary  artery.  The internal diameter of this  vessel was about 2.5 mm.  The conduit used was a left internal mammary  graft, and this was brought out through an opening in the left  pericardium anterior to the phrenic nerve.  It was anastomosed to the  LAD in an end-to-side manner using continuous 8-0 Prolene suture.  The  pedicle was tacked to the epicardium with 6-0 Prolene sutures.  The  patient was rewarmed to 37 degrees Centigrade.  With the crossclamps in  place, the 3 proximal vein graft anastomoses were performed to the  aortic root in end-to-side manner using continuous 6-0 Prolene suture.  Then, the clamp was removed from the mammary pedicle.  There was rapid  warming of the ventricular septum and return of spontaneous ventricular  fibrillation.  The crossclamp was removed with a time of 64 minutes, and  the patient defibrillated and in a sinus rhythm.  The proximal and  distal anastomoses appeared hemostatic and the lie of the grafts  satisfactory.  Graft markers were placed around the proximal  anastomoses.  Two temporary right ventricular and right atrial pacing  wires were placed and brought out through the skin.   The patient was rewarmed to 37 degrees Centigrade.  She was weaned from   cardiopulmonary bypass on low-dose dopamine.  The total bypass time was  84 minutes.  Cardiac function appeared excellent with a cardiac output  of 5 L per minute.  Protamine was given, and the venous and aortic  cannulae were removed without difficulty.  Hemostasis was achieved.  The  patient was given 2 units of blood on pump.  Then, 3 chest tubes were  placed, a tube in the posterior pericardium and 1 in the anterior  mediastinum and 1 in the left pleural space.  The pericardium was  loosely reapproximated over the heart.  The sternum was closed with #6  stainless steel wires.  The fascia was closed with continuous #1 Vicryl  suture.  The subcutaneous tissue was closed with continuous 2-0 Vicryl,  and the skin with a 3-0 Vicryl subcuticular closure.  The lower  extremity vein harvest site was closed in layers in a similar manner.  The sponge, needle and instrument counts were correct according to the scrub nurse.  Dry sterile dressings were then applied over the incisions  and around the chest tubes, which were hooked to Pleur-evac suction.  The patient remained hemodynamically stable and was transferred to the  SICU in guarded, but stable condition.      Evelene Croon, M.D.  Electronically Signed     BB/MEDQ  D:  04/27/2006  T:  04/28/2006  Job:  [id number]   cc:   Antionette Char, MD  Cardiac Cath Lab

## 2011-06-13 HISTORY — PX: OTHER SURGICAL HISTORY: SHX169

## 2011-08-18 ENCOUNTER — Other Ambulatory Visit: Payer: No Typology Code available for payment source

## 2012-05-07 ENCOUNTER — Encounter: Payer: Self-pay | Admitting: Vascular Surgery

## 2012-06-27 ENCOUNTER — Other Ambulatory Visit (HOSPITAL_COMMUNITY): Payer: Self-pay | Admitting: Cardiology

## 2012-06-27 DIAGNOSIS — I2510 Atherosclerotic heart disease of native coronary artery without angina pectoris: Secondary | ICD-10-CM

## 2012-06-27 DIAGNOSIS — Z951 Presence of aortocoronary bypass graft: Secondary | ICD-10-CM

## 2012-07-05 ENCOUNTER — Encounter (HOSPITAL_COMMUNITY): Payer: No Typology Code available for payment source

## 2012-07-24 ENCOUNTER — Encounter (HOSPITAL_COMMUNITY): Payer: No Typology Code available for payment source

## 2012-08-14 ENCOUNTER — Encounter (HOSPITAL_COMMUNITY): Payer: No Typology Code available for payment source

## 2012-08-21 HISTORY — PX: NM MYOVIEW LTD: HXRAD82

## 2012-08-28 ENCOUNTER — Ambulatory Visit (HOSPITAL_COMMUNITY)
Admission: RE | Admit: 2012-08-28 | Discharge: 2012-08-28 | Disposition: A | Discharge status: Home / Self Care | Payer: Medicare Other | Source: Ambulatory Visit | Attending: Cardiology | Admitting: Cardiology

## 2012-08-28 DIAGNOSIS — R0989 Other specified symptoms and signs involving the circulatory and respiratory systems: Secondary | ICD-10-CM | POA: Insufficient documentation

## 2012-08-28 DIAGNOSIS — J449 Chronic obstructive pulmonary disease, unspecified: Secondary | ICD-10-CM | POA: Insufficient documentation

## 2012-08-28 DIAGNOSIS — I10 Essential (primary) hypertension: Secondary | ICD-10-CM | POA: Insufficient documentation

## 2012-08-28 DIAGNOSIS — E669 Obesity, unspecified: Secondary | ICD-10-CM | POA: Insufficient documentation

## 2012-08-28 DIAGNOSIS — I739 Peripheral vascular disease, unspecified: Secondary | ICD-10-CM | POA: Insufficient documentation

## 2012-08-28 DIAGNOSIS — I2510 Atherosclerotic heart disease of native coronary artery without angina pectoris: Secondary | ICD-10-CM | POA: Insufficient documentation

## 2012-08-28 DIAGNOSIS — Z87891 Personal history of nicotine dependence: Secondary | ICD-10-CM | POA: Insufficient documentation

## 2012-08-28 DIAGNOSIS — Z8249 Family history of ischemic heart disease and other diseases of the circulatory system: Secondary | ICD-10-CM | POA: Insufficient documentation

## 2012-08-28 DIAGNOSIS — Z951 Presence of aortocoronary bypass graft: Secondary | ICD-10-CM

## 2012-08-28 DIAGNOSIS — J4489 Other specified chronic obstructive pulmonary disease: Secondary | ICD-10-CM | POA: Insufficient documentation

## 2012-08-28 DIAGNOSIS — R0609 Other forms of dyspnea: Secondary | ICD-10-CM | POA: Insufficient documentation

## 2012-08-28 DIAGNOSIS — I252 Old myocardial infarction: Secondary | ICD-10-CM | POA: Insufficient documentation

## 2012-08-28 HISTORY — PX: CARDIOVASCULAR STRESS TEST: SHX262

## 2012-08-28 MED ORDER — TECHNETIUM TC 99M SESTAMIBI GENERIC - CARDIOLITE
30.9000 | Freq: Once | INTRAVENOUS | Status: AC | PRN
  Administered 2012-08-28: 30.9 via INTRAVENOUS

## 2012-08-28 MED ORDER — REGADENOSON 0.4 MG/5ML IV SOLN
0.4000 mg | Freq: Once | INTRAVENOUS | Status: AC
  Administered 2012-08-28: 0.4 mg via INTRAVENOUS

## 2012-08-28 MED ORDER — TECHNETIUM TC 99M SESTAMIBI GENERIC - CARDIOLITE
10.4000 | Freq: Once | INTRAVENOUS | Status: AC | PRN
  Administered 2012-08-28: 10 via INTRAVENOUS

## 2012-08-28 NOTE — Procedures (Addendum)
[place] [facility] [address] [place] [address] [phone]  Cardiology Nuclear Med Study  [name] is a 77 y.o. female     MRN : [id number]     DOB: [birth year]  Procedure Date: 08/28/2012  Nuclear Med Background Indication for Stress Test:  Graft Patency History:  COPD and CAD; MI/CABG-04/2006 Cardiac Risk Factors: Family History - CAD, History of Smoking, Hypertension, Lipids, Obesity and PVD  Symptoms:  DOE   Nuclear Pre-Procedure Caffeine/Decaff Intake:  10:00pm NPO After: 8:00am   IV Site: R Antecubital  IV 0.9% NS with Angio Cath:  22g  Chest Size (in):  N/A IV Started by: Emmit Pomfret, RN  Height: 5\' 3"  (1.6 m)  Cup Size: DD  BMI:  Body mass index is 33.49 kg/(m^2). Weight:  189 lb (85.73 kg)   Tech Comments:  N/A    Nuclear Med Study 1 or 2 day study: 1 day  Stress Test Type:  Lexiscan  Order Authorizing Provider:  Bryan Lemma, MD   Resting Radionuclide: Technetium 78m Sestamibi  Resting Radionuclide Dose: 10.4 mCi   Stress Radionuclide:  Technetium 77m Sestamibi  Stress Radionuclide Dose: 30.9 mCi           Stress Protocol Rest HR: 74 Stress HR: 93  Rest BP: 192/86 Stress BP: 165/80  Exercise Time (min): n/a METS: n/a   Predicted Max HR: 146 bpm % Max HR: 63.7 bpm Rate Pressure Product: 25366  Dose of Adenosine (mg):  n/a Dose of Lexiscan: 0.4 mg  Dose of Atropine (mg): n/a Dose of Dobutamine: n/a mcg/kg/min (at max HR)  Stress Test Technologist: Esperanza Sheets, CCT Nuclear Technologist: Gonzella Lex, CNMT   Rest Procedure:  Myocardial perfusion imaging was performed at rest 45 minutes following the intravenous administration of Technetium 31m Sestamibi. Stress Procedure:  The patient received IV Lexiscan 0.4 mg over 15-seconds.  Technetium 77m Sestamibi injected at 30-seconds.  There were no significant changes with Lexiscan.  Quantitative spect images were obtained after a 45 minute  delay.  Transient Ischemic Dilatation (Normal <1.22):  0.99 Lung/Heart Ratio (Normal <0.45):  0.30 QGS EDV:  72 ml QGS ESV:  27 ml LV Ejection Fraction: 63%  Signed by Gonzella Lex, CNMT  PHYSICIAN INTERPRETATION:  Rest ECG: NSR - Normal EKG and low voltage  Stress ECG: No significant change from baseline ECG  QPS Raw Data Images:  Mild breast attenuation.  Normal left ventricular size. Stress Images:  There is decreased uptake in the anteroapex. Rest Images:  There is decreased uptake in the anteroapex. Subtraction (SDS):  There is a fixed anteriour defect that is most consistent with breast attenuation.  There is normal wall motion in this region, suggesting artifact over a fixed defect from prior infarction.There is no evidence to suggest ischemia.    Impression Exercise Capacity:  Lexiscan with no exercise. BP Response:  Normal blood pressure response. Clinical Symptoms:  There is dyspnea. ECG Impression:  No significant ECG changes with Lexiscan.  LV Wall Motion:  NL LV Function; NL Wall Motion Comparison with Prior Nuclear Study: No significant change from previous study  Overall Impression:  Normal stress nuclear study with likely breast attenuation.;  Low risk stress nuclear study.    Marykay Lex, MD  08/28/2012 1:32 PM

## 2012-12-21 ENCOUNTER — Telehealth: Payer: Self-pay | Admitting: Vascular Surgery
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
## 2013-05-31 ENCOUNTER — Encounter: Payer: Self-pay | Admitting: Cardiology

## 2013-05-31 ENCOUNTER — Ambulatory Visit (INDEPENDENT_AMBULATORY_CARE_PROVIDER_SITE_OTHER): Payer: Medicare Other | Admitting: Cardiology

## 2013-05-31 VITALS — BP 158/76 | HR 71 | Ht 63.0 in | Wt 178.2 lb

## 2013-05-31 DIAGNOSIS — I10 Essential (primary) hypertension: Secondary | ICD-10-CM

## 2013-05-31 DIAGNOSIS — E785 Hyperlipidemia, unspecified: Secondary | ICD-10-CM

## 2013-05-31 DIAGNOSIS — I739 Peripheral vascular disease, unspecified: Secondary | ICD-10-CM

## 2013-05-31 DIAGNOSIS — I2510 Atherosclerotic heart disease of native coronary artery without angina pectoris: Secondary | ICD-10-CM

## 2013-05-31 NOTE — Patient Instructions (Signed)
INCREASE METOPROLOL 50 MG 1 TABLET BY MOUTH TWICE A DAY  DISCUSS WITH NEPHROLOGIST ABOUT CHANGING TO ANOTHER ARB VS TRYING AN ACE INHIBITOR   Stable from a cardiac standpoint   Your physician wants you to follow-up in 12 months Dr Herbie Baltimore.  You will receive a reminder letter in the mail two months in advance. If you don't receive a letter, please call our office to schedule the follow-up appointment.

## 2013-06-02 ENCOUNTER — Encounter: Payer: Self-pay | Admitting: Cardiology

## 2013-06-02 DIAGNOSIS — E785 Hyperlipidemia, unspecified: Secondary | ICD-10-CM | POA: Insufficient documentation

## 2013-06-02 DIAGNOSIS — I10 Essential (primary) hypertension: Secondary | ICD-10-CM | POA: Insufficient documentation

## 2013-06-02 DIAGNOSIS — I739 Peripheral vascular disease, unspecified: Secondary | ICD-10-CM | POA: Insufficient documentation

## 2013-06-02 MED ORDER — METOPROLOL TARTRATE 50 MG PO TABS: 50.0000 mg | ORAL_TABLET | Freq: Two times a day (BID) | ORAL | Status: DC

## 2013-06-02 NOTE — Progress Notes (Signed)
PATIENT: [name] MRN: [id number]  DOB: December 21, 1935   DOV:06/02/2013 PCP: Patricia Sites, MD  Clinic Note: Chief Complaint  Patient presents with  . Annual Exam    C/o rt leg pain-possibly gout?. Started taking Irbesartan ~3 weeks ago, she now has an itchy, red rash on both arms, around both ankles and legs will start itching.   HPI: [name] is a 78 y.o.  female with a PMH below who presents today for annual followup of her coronary disease. I last saw her in December 2013 nature doing relatively well. She is a former patient of Dr. Aleen Hebert. She a followup Myoview done in April of 2014 no negative for ischemia or infarction. Preserved EF. She has not had an echocardiogram done but has no abnormal exam findings to suggest that she would be one currently.  The major thing happened since her last visit was that she is not most of November in bed with what amounted to be an inner ear infection. She has several rounds of antibiotics, and is finally starting to feel back to normal.  As part of her being so sick, she lost almost 14 pounds. She has gained some of it back, but intends to keep most of it off.  Interval History: She presents today again doing fine overall for cardiac standpoint. She's had some difficulties however she change her antihypertensive regimen around because her insurance covers it covering the Diovan she was on. Continue his Diovan ACT has been covered as opposed to just simple Diovan. Regardless she was initially put on losartan, but stopped it due to rash, she's now been switched irbesartan and again 3 weeks after starting it has noting recurrence of her rash. From a cardiac standpoint she is doing fine without major complaints. No chest tightness or pressure with exertion.  No resting or exertional dyspnea.  The remainder of Cardiovascular ROS: negative for - edema, irregular heartbeat, loss of consciousness, murmur, orthopnea, palpitations, paroxysmal  nocturnal dyspnea, rapid heart rate or shortness of breath, lightheadedness, dizziness, wooziness, syncope/near syncope.  Negative for TIA/amaurosis fugax symptoms. Negative for melena, hematochezia or hematuria. No significant nosebleeds or claudication. Edema is well-controlled on low-dose Lasix. Additional cardiac review of systems:  Past Medical History  Diagnosis Date  . CAD in native artery  2007    Referred for CABG; ALT Myoview May 2008: No ischemia or infarction. Resolution of inferolateral scar. Normal EF.  . S/P CABG x 4 2007    LIMA-LAD, SVG-Distal RCA, SVG-Ramus, SVG-Diagonal; no echocardiogram done  . PAD (peripheral artery disease) 1997    Mild-moderate carotid disease; status post right SFA occlusion with Fem-Below Knee Pop bypass ([name])  . Renal artery stenosis, non-flow-limiting     Documented by Abdominal Aortic Angiogram  . Hypertension, essential   . Hyperlipidemia LDL goal < 70   . Chronic renal insufficiency, stage III (moderate)     Prior Cardiac Evaluation and Past Surgical History: Past Surgical History  Procedure Laterality Date  . Cardiac catheterization  December 2007    LAD-90% mid, D19 percent ostial. Circumflex-OM1 90% ostial, 80% mid. RCA 9% proximal, 100% mid  . Femoropopliteal bypass Right 1997    [name]: SFA-below knee Pop  . Coronary artery bypass graft  December 2007    LIMA-LAD, SVG-distal RCA, SVG-D1, SVG-OM1/Ramus  . Nm myoview ltd  April 2014    EF 63%, mild fixed anteroapical defect thought to be breast attenuation  . Appendectomy    . Tonsillectomy    .  Cataract surgery    . Rectovaginal fistula repair    . [place] bhl  1981    Allergies  Allergen Reactions  . Latex Swelling  . Statins Other (See Comments)    Diffuse cramping; has tried Lipitor, Crestor, Pravachol and simvastatin  . Benicar [Olmesartan] Itching and Rash    Current Outpatient Prescriptions  Medication Sig Dispense Refill  . allopurinol (ZYLOPRIM) 300 MG  tablet Take 300 mg by mouth daily.      Marland Kitchen. aspirin 325 MG tablet Take 325 mg by mouth daily.      . betamethasone dipropionate (DIPROLENE) 0.05 % cream Apply topically as needed.      . Choline Fenofibrate (FENOFIBRIC ACID) 45 MG CPDR Take 45 mg by mouth daily.      . Coenzyme Q10 (CO Q 10 PO) Take 400 mg by mouth every other day.      . colchicine 0.6 MG tablet Take 0.6 mg by mouth daily as needed.      . ezetimibe (ZETIA) 10 MG tablet Take 5 mg by mouth daily.      . furosemide (LASIX) 40 MG tablet Take 40 mg by mouth 2 (two) times daily.      . irbesartan (AVAPRO) 300 MG tablet Take 300 mg by mouth daily.      Marland Kitchen. LORazepam (ATIVAN) 0.5 MG tablet Take 0.5 mg by mouth 4 (four) times daily as needed for anxiety.      Marland Kitchen. losartan (COZAAR) 50 MG tablet Take 50 mg by mouth daily.      . Multiple Vitamin (MULTIVITAMIN) tablet Take 1 tablet by mouth daily.      . Multiple Vitamins-Minerals (ICAPS) CAPS Take 1 capsule by mouth 2 (two) times daily.      . Omega-3 Fatty Acids (OMEGA-3 FISH OIL PO) Take 1,500 mg by mouth daily.      Marland Kitchen. omeprazole (PRILOSEC) 20 MG capsule Take 20 mg by mouth daily.      . vitamin E 400 UNIT capsule Take 400 Units by mouth every other day.      . metoprolol (LOPRESSOR) 50 MG tablet Take 1 tablet (50 mg total) by mouth 2 (two) times daily.  180 tablet  3   No current facility-administered medications for this visit.   History   Social History Narrative   She is a married mother of 4 with one child that died at a young age. She has 11 grandchildren and 11 great grandchildren. She is not excessively active. She does get around the house. No routine exercise   She quit smoking in 1993, and has a social alcohol beverage on occasion.   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    ROS: A comprehensive Review of Systems - Negative except Some swelling and warmth of the right ankle, that she thinks is due to gout. Musculoskeletal ROS: positive  for - joint pain, joint swelling, pain in ankle - right and swelling in ankle - right  PHYSICAL EXAM BP 158/76  Pulse 71  Ht 5\' 3"  (1.6 m)  Wt 178 lb 3.2 oz (80.831 kg)  BMI 31.57 kg/m2 General appearance: alert and oriented x3, cooperative, appears younger than stated age and no distress; well-nourished, well-groomed. HEENT: Nelson/AT, EOMI, MMM, anicteric sclera Neck: no adenopathy, no carotid bruit, no JVD and supple, symmetrical, trachea midline Lungs: clear to auscultation bilaterally, normal percussion bilaterally and nonlabored, good air movement Heart: regular rate and rhythm, S1, S2 normal, no murmur,  click, rub or gallop and normal apical impulse Abdomen: soft, non-tender; bowel sounds normal; no masses,  no organomegaly Extremities: extremities normal, atraumatic, no cyanosis or edema and varicose veins noted; there is some mild tenderness and swelling right ankle. Pulses: 2+ and symmetric Neurologic: Grossly normal  [id number] today: Yes Rate: 71 , Rhythm: NSR with first-degree AV block;  PAC  with aberrancy PVC;   Recent Labs: None available  ASSESSMENT / PLAN: CAD in native artery - CABG x4 (LIMA-LAD, SVG-distal RCA, SVG-diagonal, SVG-Ramus Relatively asymptomatic from cardiac standpoint. She had a Myoview done last April that was relatively normal EF. She is not having any angina or heart failure symptoms. Besides this time that she is unable to do to intolerance, she is on aspirin, beta blocker and attempting to be on an ARB.  Hypertension, essential Her blood pressures that high today, thinks maybe a little out of sorts, probably she took her ARB today. She is quite concerned about this rash, to she also has almost olmesartan listed as an allergy.  Unfortunately she is doing very well on Diovan, that is not covered by insurance. Her primary physician wanted her to check with Dr. Briant Cedar from nephrology to determine if it was okay to restart an ARB. My question would be  to be either get her back on Diovan HCTZ, or simply switch her over to an ACE inhibitor?   As I will only be seen on an annual basis, would defer this to her primary physician and nephrologist. She can call her nephrologist's office today or Monday to ask about recommendations.    As I'm not sure what plans will be with ARB, I will at least increase her metoprolol to 50 mg twice a day from 25 twice a day.  PAD (peripheral artery disease) No complaints of claudication. My understanding is that she does follow up with [name].  Hyperlipidemia LDL goal < 70 She is on fenofibrate, at 45 mg per day. This is being followed by her primary physician. She still does have room to increase that to 135 mg if need be.    Orders Placed This Encounter  Procedures  . EKG 12-Lead    Followup: One year  DAVID W. Herbie Baltimore, M.D., M.S. [facility] [address] [place], [place]  [address]  [phone] Pager # [phone]

## 2013-06-02 NOTE — Assessment & Plan Note (Signed)
Relatively asymptomatic from cardiac standpoint. She had a Myoview done last April that was relatively normal EF. She is not having any angina or heart failure symptoms. Besides this time that she is unable to do to intolerance, she is on aspirin, beta blocker and attempting to be on an ARB.

## 2013-06-02 NOTE — Assessment & Plan Note (Signed)
She is on fenofibrate, at 45 mg per day. This is being followed by her primary physician. She still does have room to increase that to 135 mg if need be.

## 2013-06-02 NOTE — Assessment & Plan Note (Signed)
No complaints of claudication. My understanding is that she does follow up with Dr. Hart Rochester.

## 2013-06-02 NOTE — Assessment & Plan Note (Signed)
Her blood pressures that high today, thinks maybe a little out of sorts, probably she took her ARB today. She is quite concerned about this rash, to she also has almost olmesartan listed as an allergy.  Unfortunately she is doing very well on Diovan, that is not covered by insurance. Her primary physician wanted her to check with Dr. Briant Cedar from nephrology to determine if it was okay to restart an ARB. My question would be to be either get her back on Diovan HCTZ, or simply switch her over to an ACE inhibitor?   As I will only be seen on an annual basis, would defer this to her primary physician and nephrologist. She can call her nephrologist's office today or Monday to ask about recommendations.    As I'm not sure what plans will be with ARB, I will at least increase her metoprolol to 50 mg twice a day from 25 twice a day.

## 2013-06-11 ENCOUNTER — Telehealth: Payer: Self-pay | Admitting: Cardiology

## 2013-06-11 NOTE — Telephone Encounter (Signed)
Patient called stating Dr. Briant Cedar had not received her office note from Dr. Herbie Baltimore from 05-31-13.  Also her prescription for metoprolol was called in to a pharmacy in [place] and she says this is the wrong pharmacy and had to wait 24 hours for the prescription.  I have faxed Dr. Elissa Hefty office note and also ekg to Dr. Briant Cedar today 06-11-13. ST

## 2013-06-14 ENCOUNTER — Telehealth: Payer: Self-pay | Admitting: *Deleted

## 2013-06-14 NOTE — Telephone Encounter (Signed)
Left message to call back  Needed to ask a question - what is pharmacy

## 2013-06-14 NOTE — Telephone Encounter (Signed)
Patient called back.  She states that the pharmacy she uses --[phone]  Change pharmacy in  [facility]-chart

## 2013-10-21 ENCOUNTER — Other Ambulatory Visit (HOSPITAL_COMMUNITY): Payer: Self-pay | Admitting: Cardiology

## 2013-10-21 DIAGNOSIS — I771 Stricture of artery: Secondary | ICD-10-CM

## 2013-10-21 DIAGNOSIS — I701 Atherosclerosis of renal artery: Secondary | ICD-10-CM

## 2013-10-28 ENCOUNTER — Other Ambulatory Visit (HOSPITAL_COMMUNITY): Payer: Self-pay | Admitting: Cardiology

## 2013-10-28 DIAGNOSIS — N189 Chronic kidney disease, unspecified: Secondary | ICD-10-CM

## 2013-10-29 ENCOUNTER — Ambulatory Visit (HOSPITAL_COMMUNITY): Payer: Medicare Other | Attending: Cardiovascular Disease | Admitting: Cardiology

## 2013-10-29 DIAGNOSIS — N183 Chronic kidney disease, stage 3 unspecified: Secondary | ICD-10-CM

## 2013-10-29 DIAGNOSIS — I129 Hypertensive chronic kidney disease with stage 1 through stage 4 chronic kidney disease, or unspecified chronic kidney disease: Secondary | ICD-10-CM | POA: Insufficient documentation

## 2013-10-29 DIAGNOSIS — I2510 Atherosclerotic heart disease of native coronary artery without angina pectoris: Secondary | ICD-10-CM | POA: Insufficient documentation

## 2013-10-29 DIAGNOSIS — Z87891 Personal history of nicotine dependence: Secondary | ICD-10-CM | POA: Insufficient documentation

## 2013-10-29 DIAGNOSIS — I701 Atherosclerosis of renal artery: Secondary | ICD-10-CM | POA: Insufficient documentation

## 2013-10-29 DIAGNOSIS — N189 Chronic kidney disease, unspecified: Secondary | ICD-10-CM

## 2013-10-29 DIAGNOSIS — E785 Hyperlipidemia, unspecified: Secondary | ICD-10-CM | POA: Insufficient documentation

## 2013-10-29 NOTE — Progress Notes (Signed)
Renal artery duplex completed 

## 2013-11-05 ENCOUNTER — Telehealth: Payer: Self-pay | Admitting: Cardiology

## 2013-11-05 NOTE — Telephone Encounter (Signed)
Dr. Herbie Baltimore has spoken to Dr. Briant Cedar

## 2013-11-05 NOTE — Telephone Encounter (Signed)
Dr.Mattingly wants to spek to Dr.Harding about [name]

## 2013-11-08 ENCOUNTER — Telehealth: Payer: Self-pay | Admitting: Cardiovascular Disease

## 2013-11-08 NOTE — Telephone Encounter (Signed)
Closed encounter °

## 2013-11-20 ENCOUNTER — Ambulatory Visit (INDEPENDENT_AMBULATORY_CARE_PROVIDER_SITE_OTHER): Payer: Medicare Other | Admitting: Cardiovascular Disease

## 2013-11-20 ENCOUNTER — Encounter: Payer: Self-pay | Admitting: Cardiovascular Disease

## 2013-11-20 VITALS — BP 169/71 | HR 66 | Ht 63.0 in | Wt 175.5 lb

## 2013-11-20 DIAGNOSIS — I701 Atherosclerosis of renal artery: Secondary | ICD-10-CM | POA: Insufficient documentation

## 2013-11-20 DIAGNOSIS — I10 Essential (primary) hypertension: Secondary | ICD-10-CM

## 2013-11-20 NOTE — Patient Instructions (Signed)
Your physician recommends that you schedule a follow-up appointment in: ONE year with [facility]  Your physician has requested that you have a renal artery duplex. During this test, an ultrasound is used to evaluate blood flow to the kidneys. Allow one hour for this exam. Do not eat after midnight the day before and avoid carbonated beverages. Take your medications as you usually do. July 2016  Follow up with Dr.Harding as directed

## 2013-11-20 NOTE — Progress Notes (Signed)
11/20/2013 Patricia HaverVirginia W Hebert   1936/04/26  [id number]  Primary Physician Michiel SitesKOHUT,WALTER DENNIS, MD Primary Cardiologist: Runell GessJonathan J. Berry MD Roseanne RenoFACP,FACC,FAHA, FSCAI   HPI:  [name] is a delightful 78 year old moderately overweight married Caucasian female mother of 3 living children, grandmother to [phone] grandchildren and great grandmother of [phone] great-grandchildren. She was referred by Dr. Primitivo GauzeMichael Mattingly, her nephrologist, for evaluation of potential renal vascular hypertension. Her primary care physician is Dr. Juleen ChinaKohut  and her cardiologist Dr. Herbie BaltimoreHarding.she has a history of remote coronary artery bypass grafting as well as right femoropopliteal bypass grafting. Protons include hypertension, and hyperlipidemia. She's had a stress test last year which was nonischemic. She denies chest pain, shortness of breath or claudication. Her serum creatinines run in the mid 2 range. Recent renal Doppler study suggested moderate bilateral renal artery stenosis. At this point, I do not think that her renal arteries are necessarily contributing to her renal insufficiency her hypertension.   Current Outpatient Prescriptions  Medication Sig Dispense Refill  . aspirin 325 MG tablet Take 325 mg by mouth daily.      . betamethasone dipropionate (DIPROLENE) 0.05 % cream Apply topically as needed.      . Choline Fenofibrate (FENOFIBRIC ACID) 45 MG CPDR Take 45 mg by mouth daily.      . Coenzyme Q10 (CO Q 10 PO) Take 400 mg by mouth every other day.      . colchicine 0.6 MG tablet Take 0.6 mg by mouth daily as needed.      . ezetimibe (ZETIA) 10 MG tablet Take 5 mg by mouth daily.      . furosemide (LASIX) 40 MG tablet Take 40 mg by mouth 2 (two) times daily.      . hydrALAZINE (APRESOLINE) 25 MG tablet Take 25 mg by mouth 2 (two) times daily.      Marland Kitchen. LORazepam (ATIVAN) 0.5 MG tablet Take 0.5 mg by mouth 4 (four) times daily as needed for anxiety.      . metoprolol (LOPRESSOR) 50 MG tablet Take 1 tablet (50 mg  total) by mouth 2 (two) times daily.  180 tablet  3  . Multiple Vitamin (MULTIVITAMIN) tablet Take 1 tablet by mouth daily.      . Multiple Vitamins-Minerals (ICAPS) CAPS Take 1 capsule by mouth 2 (two) times daily.      . Omega-3 Fatty Acids (OMEGA-3 FISH OIL PO) Take 1,500 mg by mouth daily.      Marland Kitchen. omeprazole (PRILOSEC) 20 MG capsule Take 20 mg by mouth daily.      . vitamin E 400 UNIT capsule Take 400 Units by mouth every other day.       No current facility-administered medications for this visit.    Allergies  Allergen Reactions  . Latex Swelling  . Statins Other (See Comments)    Diffuse cramping; has tried Lipitor, Crestor, Pravachol and simvastatin  . Benicar [Olmesartan] Itching and Rash  . Other Rash    ARB's    History   Social History  . Marital Status: Married    Spouse Name: N/A    Number of Children: N/A  . Years of Education: N/A   Occupational History  . Not on file.   Social History Main Topics  . Smoking status: Former Smoker    Types: Cigarettes    Quit date: 05/31/1993  . Smokeless tobacco: Never Used  . Alcohol Use: 4.2 oz/week    7 Glasses of wine per week  . Drug  Use: No  . Sexual Activity: Not on file   Other Topics Concern  . Not on file   Social History Narrative   She is a married mother of 4 with one child that died at a young age. She has 11 grandchildren and 11 great grandchildren. She is not excessively active. She does get around the house. No routine exercise   She quit smoking in 1993, and has a social alcohol beverage on occasion.   She is a former Hotel manager working as a Lawyer.     Review of Systems: General: negative for chills, fever, night sweats or weight changes.  Cardiovascular: negative for chest pain, dyspnea on exertion, edema, orthopnea, palpitations, paroxysmal nocturnal dyspnea or shortness of breath Dermatological: negative for rash Respiratory: negative for cough or  wheezing Urologic: negative for hematuria Abdominal: negative for nausea, vomiting, diarrhea, bright red blood per rectum, melena, or hematemesis Neurologic: negative for visual changes, syncope, or dizziness All other systems reviewed and are otherwise negative except as noted above.    Blood pressure 169/71, pulse 66, height 5\' 3"  (1.6 m), weight 175 lb 8 oz (79.606 kg).  General appearance: alert and no distress Neck: no adenopathy, no carotid bruit, no JVD, supple, symmetrical, trachea midline and thyroid not enlarged, symmetric, no tenderness/mass/nodules Lungs: clear to auscultation bilaterally Heart: regular rate and rhythm, S1, S2 normal, no murmur, click, rub or gallop Extremities: 1-2+ right pedal pulse, absent left pedal pulse  EKG not performed today  ASSESSMENT AND PLAN:   Renal artery stenosis, native, bilateral Patient was referred to me by Dr. Primitivo Gauze from [facility] for evaluation of renal artery stenosis. She is a 78 year old female history of coronary artery disease status post bypass grafting, peripheral vascular disease status post remote femoropopliteal bypass grafting, hypertension and chronic renal insufficiency. Her creatinines were in the mid-2 range. She has been on ARB for the past. Recent renal Doppler studies performed 10/29/13 revealed normal renal dimensions bilaterally with renal aortic wishes in the low to mid-4 range. I do not think that her renal artery stenosis severity is tight enough to be causing her high blood pressure renal insufficiency. She does check her blood pressures at home which run in the 150/70 range. If she had angiographic imaging she would need CO2 abdominal aortography and limited dye for intervention. At this point I prefer conservative treatment with serial Dopplers her annual basis.      Runell Gess MD FACP,FACC,FAHA, [facility] 11/20/2013 2:12 PM   After discussion with Dr. Briant Cedar, referring nephrologist, he  desires a CO2 of aortogram/renal angiogram to further define her renal anatomy. Contact her and make her aware of this and arrange the procedure.

## 2013-11-20 NOTE — Assessment & Plan Note (Signed)
Patient was referred to me by Dr. Primitivo Gauze from [facility] for evaluation of renal artery stenosis. She is a 78 year old female history of coronary artery disease status post bypass grafting, peripheral vascular disease status post remote femoropopliteal bypass grafting, hypertension and chronic renal insufficiency. Her creatinines were in the mid-2 range. She has been on ARB for the past. Recent renal Doppler studies performed 10/29/13 revealed normal renal dimensions bilaterally with renal aortic wishes in the low to mid-4 range. I do not think that her renal artery stenosis severity is tight enough to be causing her high blood pressure renal insufficiency. She does check her blood pressures at home which run in the 150/70 range. If she had angiographic imaging she would need CO2 abdominal aortography and limited dye for intervention. At this point I prefer conservative treatment with serial Dopplers her annual basis.

## 2013-12-02 ENCOUNTER — Encounter: Payer: Self-pay | Admitting: Cardiovascular Disease

## 2013-12-02 ENCOUNTER — Other Ambulatory Visit: Payer: Self-pay | Admitting: *Deleted

## 2013-12-02 ENCOUNTER — Telehealth: Payer: Self-pay | Admitting: *Deleted

## 2013-12-02 DIAGNOSIS — I739 Peripheral vascular disease, unspecified: Secondary | ICD-10-CM

## 2013-12-02 NOTE — Telephone Encounter (Signed)
Dr Allyson Sabal spoke with [name] and advised that Dr Briant Cedar wanted patient to proceed with renal angiogram.  Patient was agreeable with proceeding. She will be admitted a day early for hydration.

## 2013-12-10 ENCOUNTER — Encounter (HOSPITAL_COMMUNITY): Payer: Self-pay | Admitting: Pharmacy Technician

## 2013-12-15 ENCOUNTER — Ambulatory Visit (HOSPITAL_COMMUNITY)
Admission: RE | Admit: 2013-12-15 | Discharge: 2013-12-17 | Disposition: A | Discharge status: Home / Self Care | Payer: Medicare Other | Source: Ambulatory Visit | Attending: Cardiovascular Disease | Admitting: Cardiovascular Disease

## 2013-12-15 ENCOUNTER — Encounter (HOSPITAL_COMMUNITY): Payer: Self-pay | Admitting: *Deleted

## 2013-12-15 DIAGNOSIS — Z959 Presence of cardiac and vascular implant and graft, unspecified: Secondary | ICD-10-CM

## 2013-12-15 DIAGNOSIS — I129 Hypertensive chronic kidney disease with stage 1 through stage 4 chronic kidney disease, or unspecified chronic kidney disease: Secondary | ICD-10-CM | POA: Insufficient documentation

## 2013-12-15 DIAGNOSIS — I701 Atherosclerosis of renal artery: Principal | ICD-10-CM | POA: Insufficient documentation

## 2013-12-15 DIAGNOSIS — I150 Renovascular hypertension: Secondary | ICD-10-CM | POA: Diagnosis present

## 2013-12-15 DIAGNOSIS — Z6831 Body mass index (BMI) 31.0-31.9, adult: Secondary | ICD-10-CM | POA: Diagnosis not present

## 2013-12-15 DIAGNOSIS — D649 Anemia, unspecified: Secondary | ICD-10-CM | POA: Diagnosis not present

## 2013-12-15 DIAGNOSIS — E663 Overweight: Secondary | ICD-10-CM | POA: Diagnosis not present

## 2013-12-15 DIAGNOSIS — E785 Hyperlipidemia, unspecified: Secondary | ICD-10-CM | POA: Diagnosis not present

## 2013-12-15 DIAGNOSIS — Z951 Presence of aortocoronary bypass graft: Secondary | ICD-10-CM | POA: Insufficient documentation

## 2013-12-15 DIAGNOSIS — N183 Chronic kidney disease, stage 3 unspecified: Secondary | ICD-10-CM | POA: Diagnosis not present

## 2013-12-15 DIAGNOSIS — N2889 Other specified disorders of kidney and ureter: Secondary | ICD-10-CM | POA: Diagnosis present

## 2013-12-15 DIAGNOSIS — N185 Chronic kidney disease, stage 5: Secondary | ICD-10-CM | POA: Diagnosis present

## 2013-12-15 DIAGNOSIS — I10 Essential (primary) hypertension: Secondary | ICD-10-CM | POA: Diagnosis present

## 2013-12-15 LAB — CBC
HEMATOCRIT: 31 % — AB (ref 36.0–46.0)
HEMOGLOBIN: 9.9 g/dL — AB (ref 12.0–15.0)
MCH: 30.6 pg (ref 26.0–34.0)
MCHC: 31.9 g/dL (ref 30.0–36.0)
MCV: 95.7 fL (ref 78.0–100.0)
Platelets: 291 10*3/uL (ref 150–400)
RBC: 3.24 MIL/uL — ABNORMAL LOW (ref 3.87–5.11)
RDW: 12.3 % (ref 11.5–15.5)
WBC: 7.2 10*3/uL (ref 4.0–10.5)

## 2013-12-15 LAB — BASIC METABOLIC PANEL
Anion gap: 18 — ABNORMAL HIGH (ref 5–15)
BUN: 58 mg/dL — ABNORMAL HIGH (ref 6–23)
CALCIUM: 9 mg/dL (ref 8.4–10.5)
CHLORIDE: 98 meq/L (ref 96–112)
CO2: 26 meq/L (ref 19–32)
Creatinine, Ser: 2.25 mg/dL — ABNORMAL HIGH (ref 0.50–1.10)
GFR calc Af Amer: 23 mL/min — ABNORMAL LOW (ref >90)
GFR calc non Af Amer: 20 mL/min — ABNORMAL LOW (ref >90)
GLUCOSE: 127 mg/dL — AB (ref 70–99)
Potassium: 3.4 mEq/L — ABNORMAL LOW (ref 3.7–5.3)
SODIUM: 142 meq/L (ref 137–147)

## 2013-12-15 MED ORDER — HYDRALAZINE HCL 25 MG PO TABS
25.0000 mg | ORAL_TABLET | Freq: Two times a day (BID) | ORAL | Status: DC
  Administered 2013-12-16 – 2013-12-17 (×3): 25 mg via ORAL
  Filled 2013-12-15 (×6): qty 1

## 2013-12-15 MED ORDER — ASPIRIN 325 MG PO TABS: 325.0000 mg | ORAL_TABLET | Freq: Every day | ORAL | Status: DC

## 2013-12-15 MED ORDER — ASPIRIN 81 MG PO CHEW
81.0000 mg | CHEWABLE_TABLET | ORAL | Status: AC
  Administered 2013-12-16: 81 mg via ORAL
  Filled 2013-12-15: qty 1

## 2013-12-15 MED ORDER — EZETIMIBE 10 MG PO TABS
5.0000 mg | ORAL_TABLET | Freq: Every day | ORAL | Status: DC
  Administered 2013-12-16 – 2013-12-17 (×2): 5 mg via ORAL
  Filled 2013-12-15 (×4): qty 0.5

## 2013-12-15 MED ORDER — SODIUM CHLORIDE 0.9 % IV SOLN
INTRAVENOUS | Status: DC
  Administered 2013-12-15 – 2013-12-16 (×2): via INTRAVENOUS

## 2013-12-15 MED ORDER — SODIUM CHLORIDE 0.9 % IV SOLN: 250.0000 mL | INTRAVENOUS | Status: DC | PRN

## 2013-12-15 MED ORDER — FEBUXOSTAT 40 MG PO TABS
40.0000 mg | ORAL_TABLET | Freq: Every day | ORAL | Status: DC
  Administered 2013-12-16 – 2013-12-17 (×2): 40 mg via ORAL
  Filled 2013-12-15 (×4): qty 1

## 2013-12-15 MED ORDER — SODIUM CHLORIDE 0.9 % IJ SOLN: 3.0000 mL | INTRAMUSCULAR | Status: DC | PRN

## 2013-12-15 MED ORDER — METOPROLOL TARTRATE 50 MG PO TABS
50.0000 mg | ORAL_TABLET | Freq: Two times a day (BID) | ORAL | Status: DC
  Administered 2013-12-16 – 2013-12-17 (×3): 50 mg via ORAL
  Filled 2013-12-15 (×2): qty 1
  Filled 2013-12-15: qty 2
  Filled 2013-12-15 (×4): qty 1

## 2013-12-15 MED ORDER — PANTOPRAZOLE SODIUM 40 MG PO TBEC
40.0000 mg | DELAYED_RELEASE_TABLET | Freq: Every day | ORAL | Status: DC
  Administered 2013-12-16 – 2013-12-17 (×2): 40 mg via ORAL
  Filled 2013-12-15 (×3): qty 1

## 2013-12-15 MED ORDER — ASPIRIN 325 MG PO TABS
325.0000 mg | ORAL_TABLET | Freq: Every day | ORAL | Status: AC
  Filled 2013-12-15: qty 1

## 2013-12-15 MED ORDER — SODIUM CHLORIDE 0.9 % IJ SOLN: 3.0000 mL | Freq: Two times a day (BID) | INTRAMUSCULAR | Status: DC

## 2013-12-15 NOTE — Progress Notes (Signed)
  Patient arrived for pre-cath hydration for renal angiogram with Dr. Allyson Sabal in the am. Patient is roomed and stable. Orders have been placed. Will start IVFs. NPO at midnight.   Robbie Lis, PA-C

## 2013-12-16 ENCOUNTER — Encounter (HOSPITAL_COMMUNITY)
Admission: RE | Disposition: A | Discharge status: Home / Self Care | Payer: Self-pay | Source: Ambulatory Visit | Attending: Cardiovascular Disease

## 2013-12-16 ENCOUNTER — Other Ambulatory Visit: Payer: Self-pay

## 2013-12-16 DIAGNOSIS — N183 Chronic kidney disease, stage 3 unspecified: Secondary | ICD-10-CM | POA: Diagnosis not present

## 2013-12-16 DIAGNOSIS — I129 Hypertensive chronic kidney disease with stage 1 through stage 4 chronic kidney disease, or unspecified chronic kidney disease: Secondary | ICD-10-CM | POA: Diagnosis not present

## 2013-12-16 DIAGNOSIS — I10 Essential (primary) hypertension: Secondary | ICD-10-CM

## 2013-12-16 DIAGNOSIS — N2889 Other specified disorders of kidney and ureter: Secondary | ICD-10-CM | POA: Diagnosis present

## 2013-12-16 DIAGNOSIS — E785 Hyperlipidemia, unspecified: Secondary | ICD-10-CM | POA: Diagnosis not present

## 2013-12-16 DIAGNOSIS — I701 Atherosclerosis of renal artery: Secondary | ICD-10-CM | POA: Diagnosis not present

## 2013-12-16 DIAGNOSIS — N185 Chronic kidney disease, stage 5: Secondary | ICD-10-CM | POA: Diagnosis present

## 2013-12-16 HISTORY — PX: PERCUTANEOUS STENT INTERVENTION: SHX5500

## 2013-12-16 HISTORY — PX: RENAL ARTERY STENT: SHX2321

## 2013-12-16 HISTORY — PX: RENAL ANGIOGRAM: SHX5509

## 2013-12-16 LAB — CBC
HCT: 29.2 % — ABNORMAL LOW (ref 36.0–46.0)
Hemoglobin: 9.4 g/dL — ABNORMAL LOW (ref 12.0–15.0)
MCH: 31 pg (ref 26.0–34.0)
MCHC: 32.2 g/dL (ref 30.0–36.0)
MCV: 96.4 fL (ref 78.0–100.0)
PLATELETS: 224 10*3/uL (ref 150–400)
RBC: 3.03 MIL/uL — AB (ref 3.87–5.11)
RDW: 12.2 % (ref 11.5–15.5)
WBC: 5.5 10*3/uL (ref 4.0–10.5)

## 2013-12-16 LAB — POCT ACTIVATED CLOTTING TIME
Activated Clotting Time: 191 seconds
Activated Clotting Time: 202 seconds
Activated Clotting Time: 163 seconds

## 2013-12-16 LAB — BASIC METABOLIC PANEL
ANION GAP: 14 (ref 5–15)
BUN: 54 mg/dL — ABNORMAL HIGH (ref 6–23)
CALCIUM: 8.4 mg/dL (ref 8.4–10.5)
CO2: 25 mEq/L (ref 19–32)
Chloride: 101 mEq/L (ref 96–112)
Creatinine, Ser: 2.04 mg/dL — ABNORMAL HIGH (ref 0.50–1.10)
GFR, EST AFRICAN AMERICAN: 26 mL/min — AB (ref >90)
GFR, EST NON AFRICAN AMERICAN: 23 mL/min — AB (ref >90)
Glucose, Bld: 103 mg/dL — ABNORMAL HIGH (ref 70–99)
Potassium: 3.6 mEq/L — ABNORMAL LOW (ref 3.7–5.3)
Sodium: 140 mEq/L (ref 137–147)

## 2013-12-16 LAB — PROTIME-INR
INR: 1.15 (ref 0.00–1.49)
Prothrombin Time: 14.7 seconds (ref 11.6–15.2)

## 2013-12-16 SURGERY — RENAL ANGIOGRAM
Anesthesia: LOCAL

## 2013-12-16 MED ORDER — CLOPIDOGREL BISULFATE 75 MG PO TABS
75.0000 mg | ORAL_TABLET | Freq: Every day | ORAL | Status: DC
  Administered 2013-12-17: 75 mg via ORAL
  Filled 2013-12-16: qty 1

## 2013-12-16 MED ORDER — ACETAMINOPHEN 325 MG PO TABS: 650.0000 mg | ORAL_TABLET | ORAL | Status: DC | PRN

## 2013-12-16 MED ORDER — CLOPIDOGREL BISULFATE 300 MG PO TABS
ORAL_TABLET | ORAL | Status: AC
  Filled 2013-12-16: qty 1

## 2013-12-16 MED ORDER — HYDRALAZINE HCL 20 MG/ML IJ SOLN
INTRAMUSCULAR | Status: AC
  Filled 2013-12-16: qty 1

## 2013-12-16 MED ORDER — FENTANYL CITRATE 0.05 MG/ML IJ SOLN
INTRAMUSCULAR | Status: AC
  Filled 2013-12-16: qty 2

## 2013-12-16 MED ORDER — LIDOCAINE HCL (PF) 1 % IJ SOLN
INTRAMUSCULAR | Status: AC
  Filled 2013-12-16: qty 30

## 2013-12-16 MED ORDER — HYDRALAZINE HCL 20 MG/ML IJ SOLN
10.0000 mg | INTRAMUSCULAR | Status: DC | PRN
  Administered 2013-12-16: 11:00:00 10 mg via INTRAVENOUS
  Filled 2013-12-16: qty 1

## 2013-12-16 MED ORDER — ONDANSETRON HCL 4 MG/2ML IJ SOLN: 4.0000 mg | Freq: Four times a day (QID) | INTRAMUSCULAR | Status: DC | PRN

## 2013-12-16 MED ORDER — MIDAZOLAM HCL 2 MG/2ML IJ SOLN
INTRAMUSCULAR | Status: AC
  Filled 2013-12-16: qty 2

## 2013-12-16 MED ORDER — SODIUM CHLORIDE 0.9 % IV SOLN
INTRAVENOUS | Status: AC
  Administered 2013-12-16 (×2): via INTRAVENOUS

## 2013-12-16 MED ORDER — ASPIRIN EC 325 MG PO TBEC
325.0000 mg | DELAYED_RELEASE_TABLET | Freq: Every day | ORAL | Status: DC
  Administered 2013-12-17: 325 mg via ORAL
  Filled 2013-12-16: qty 1

## 2013-12-16 MED ORDER — HEPARIN (PORCINE) IN NACL 2-0.9 UNIT/ML-% IJ SOLN
INTRAMUSCULAR | Status: AC
  Filled 2013-12-16: qty 1000

## 2013-12-16 NOTE — Interval H&P Note (Signed)
History and Physical Interval Note:  12/16/2013 8:57 AM  [name]  has presented today for surgery, with the diagnosis of pvd  The various methods of treatment have been discussed with the patient and family. After consideration of risks, benefits and other options for treatment, the patient has consented to  Procedure(s): RENAL ANGIOGRAM (N/A) as a surgical intervention .  The patient's history has been reviewed, patient examined, no change in status, stable for surgery.  I have reviewed the patient's chart and labs.  Questions were answered to the patient's satisfaction.     Runell Gess

## 2013-12-16 NOTE — Progress Notes (Signed)
Site area: left groin  Site Prior to Removal:  Level 0  Pressure Applied For 20 MINUTES    Minutes Beginning at 1200  Manual:   Yes.    Patient Status During Pull:  stable  Post Pull Groin Site:  Level 0  Post Pull Instructions Given:  Yes.    Post Pull Pulses Present:  Yes.    Dressing Applied:  Yes.    Comments:

## 2013-12-16 NOTE — CV Procedure (Signed)
[name] is a 78 y.o. female    [id number] LOCATION:  FACILITY: [facility]  PHYSICIAN: Nanetta Batty, M.D. May 02, 1936   DATE OF PROCEDURE:  12/16/2013  DATE OF DISCHARGE:     PV Angiogram/Intervention    History obtained from chart review.[name] is a delightful 78 year old moderately overweight married Caucasian female mother of 3 living children, grandmother to 50 grandchildren and great grandmother of 36 great-grandchildren. She was referred by Dr. Primitivo Gauze, her nephrologist, for evaluation of potential renal vascular hypertension. Her primary care physician is Dr. Juleen China and her cardiologist Dr. Herbie Baltimore. She has a history of remote coronary artery bypass grafting as well as right femoropopliteal bypass grafting. Her CV risk factors include hypertension, and hyperlipidemia. She's had a stress test last year which was nonischemic. She denies chest pain, shortness of breath or claudication. Her serum creatinines runs in the mid 2 range. Recent renal Doppler study suggested moderate bilateral renal artery stenosis. After discussion with her nephrologist, it was decided to proceed with angiography using CO2 and potential intervention. The patient will be admitted to the [facility] for hydration.   PROCEDURE DESCRIPTION:   The patient was brought to the second floor [place] [facility]iac cath lab in the postabsorptive state. She was premedicated with Valium 5 mg by mouth, IV Versed and fentanyl. Her left groinwas prepped and shaved in usual sterile fashion. Xylocaine 1% was used for local anesthesia. A 6 French sheath was inserted into the left common femoral artery using standard Seldinger technique.a 5 French pigtail catheter was placed at the level of the renal arteries. CO2 abdominal aortography was performed to conserve contrast given her renal insufficiency. Selective renal angiography was performed with a 6 French short right Judkins guide. A total of 40 cc of contrast was  used for the diagnostic and intervention. Retrograde aortic pressure was monitored during the case.   HEMODYNAMICS:    AO SYSTOLIC/AO DIASTOLIC: 203/46   Angiographic Data:   Abdominal aortogram-there appeared to be bilateral renal artery stenosis using CO2 aortography. Selective angiography revealed 75-80% bilateral renal artery stenosis. There was no unusual focal eccentric plaque in the proximal right renal artery.  IMPRESSION:high-grade bilateral renal artery stenosis in the setting of hypertension and renal insufficiency. I will plan on fixing the left renal artery given the unusual appearance of the right renal artery stenosis.  Procedure Description:the patient received 8000 units of heparin intravenously with an ACT of 201. Total contrast administered the patient was 40 cc. I crossed the left renal artery stenosis with a 0.14 stabilizer wire. I then predilated with a 3 mm x 3 cm rapid exchange balloon and stented with a 6 mm x 15 mm long Herculink balloon stent pre-female at 13 atmospheres (6 mm) resulting reduction of a 75-80% proximal left renal artery stenosis to 0% residual. The guidewire and catheter were then removed and the sheath was sewn securely in place.  Final Impression: successful left renal artery PTA and stenting for presumed renal vascular hypertension and renal insufficiency using CO2 angiography of the abdominal aorta to conserve contrast. Patient will be hydrated overnight, treated with aspirin and Plavix. Her renal function will be assessed in the morning after which she'll be discharged home. I will obtain renal Doppler's in our [facility] office after which she will see me back in followup. I will review the clinical plan with Dr. Briant Cedar, her nephrologist. At this point we will reserve staging her right renal artery depending on her clinical response.    Drisana Schweickert J.  MD, [facility] 12/16/2013 10:10 AM

## 2013-12-16 NOTE — H&P (View-Only) (Signed)
   SUBJECTIVE:  Feels well.  Urinating a lot.  OBJECTIVE:   Vitals:   Filed Vitals:   12/15/13 1427 12/15/13 1610 12/15/13 2109 12/16/13 0555  BP: 137/55  148/49 169/57  Pulse: 72  65 66  Temp: 98.6 F (37 C)  98.5 F (36.9 C) 98 F (36.7 C)  TempSrc: Oral  Oral Oral  Resp: 17  18 18  Height:  5' 3" (1.6 m)    Weight: 175 lb 12.8 oz (79.742 kg)     SpO2: 100%  100% 100%   I&O's:   Intake/Output Summary (Last 24 hours) at 12/16/13 0835 Last data filed at 12/16/13 0600  Gross per 24 hour  Intake 831.25 ml  Output    600 ml  Net 231.25 ml   TELEMETRY: Reviewed telemetry pt in NSR:     PHYSICAL EXAM General: Well developed, well nourished, in no acute distress Head:   Normal cephalic and atramatic  Lungs: Clear bilaterally to auscultation. Heart:  HRRR S1 S2  No JVD.   Abdomen: abdomen soft and non-tender Msk:  Back normal,  Normal strength and tone for age. Extremities:   No edema.   Neuro: Alert and oriented. Psych:  Normal affect, responds appropriately   LABS: Basic Metabolic Panel:  Recent Labs  12/15/13 1830 12/16/13 0540  NA 142 140  K 3.4* 3.6*  CL 98 101  CO2 26 25  GLUCOSE 127* 103*  BUN 58* 54*  CREATININE 2.25* 2.04*  CALCIUM 9.0 8.4   Liver Function Tests: No results found for this basename: AST, ALT, ALKPHOS, BILITOT, PROT, ALBUMIN,  in the last 72 hours No results found for this basename: LIPASE, AMYLASE,  in the last 72 hours CBC:  Recent Labs  12/15/13 1830 12/16/13 0540  WBC 7.2 5.5  HGB 9.9* 9.4*  HCT 31.0* 29.2*  MCV 95.7 96.4  PLT 291 224   Cardiac Enzymes: No results found for this basename: CKTOTAL, CKMB, CKMBINDEX, TROPONINI,  in the last 72 hours BNP: No components found with this basename: POCBNP,  D-Dimer: No results found for this basename: DDIMER,  in the last 72 hours Hemoglobin A1C: No results found for this basename: HGBA1C,  in the last 72 hours Fasting Lipid Panel: No results found for this basename:  CHOL, HDL, LDLCALC, TRIG, CHOLHDL, LDLDIRECT,  in the last 72 hours Thyroid Function Tests: No results found for this basename: TSH, T4TOTAL, FREET3, T3FREE, THYROIDAB,  in the last 72 hours Anemia Panel: No results found for this basename: VITAMINB12, FOLATE, FERRITIN, TIBC, IRON, RETICCTPCT,  in the last 72 hours Coag Panel:   Lab Results  Component Value Date   INR 1.15 12/16/2013    RADIOLOGY: No results found.    ASSESSMENT: Renovascular HTN  PLAN:  Plan for angiogram today.  She was hydrated.  Questions answered.  She asked about a Foley, but I explained to her that we would try to avoid a catheter to decrease infection risk.  CRI: Improved with hydration.  Daimian Sudberry S., MD  12/16/2013  8:35 AM    

## 2013-12-16 NOTE — Progress Notes (Addendum)
   SUBJECTIVE:  Feels well.  Urinating a lot.  OBJECTIVE:   Vitals:   Filed Vitals:   12/15/13 1427 12/15/13 1610 12/15/13 2109 12/16/13 0555  BP: 137/55  148/49 169/57  Pulse: 72  65 66  Temp: 98.6 F (37 C)  98.5 F (36.9 C) 98 F (36.7 C)  TempSrc: Oral  Oral Oral  Resp: 17  18 18   Height:  5\' 3"  (1.6 m)    Weight: 175 lb 12.8 oz (79.742 kg)     SpO2: 100%  100% 100%   I&O's:   Intake/Output Summary (Last 24 hours) at 12/16/13 0835 Last data filed at 12/16/13 0600  Gross per 24 hour  Intake 831.25 ml  Output    600 ml  Net 231.25 ml   TELEMETRY: Reviewed telemetry pt in NSR:     PHYSICAL EXAM General: Well developed, well nourished, in no acute distress Head:   Normal cephalic and atramatic  Lungs: Clear bilaterally to auscultation. Heart:  HRRR S1 S2  No JVD.   Abdomen: abdomen soft and non-tender Msk:  Back normal,  Normal strength and tone for age. Extremities:   No edema.   Neuro: Alert and oriented. Psych:  Normal affect, responds appropriately   LABS: Basic Metabolic Panel:  Recent Labs  76/73/41 1830 12/16/13 0540  NA 142 140  K 3.4* 3.6*  CL 98 101  CO2 26 25  GLUCOSE 127* 103*  BUN 58* 54*  CREATININE 2.25* 2.04*  CALCIUM 9.0 8.4   Liver Function Tests: No results found for this basename: AST, ALT, ALKPHOS, BILITOT, PROT, ALBUMIN,  in the last 72 hours No results found for this basename: LIPASE, AMYLASE,  in the last 72 hours CBC:  Recent Labs  12/15/13 1830 12/16/13 0540  WBC 7.2 5.5  HGB 9.9* 9.4*  HCT 31.0* 29.2*  MCV 95.7 96.4  PLT 291 224   Cardiac Enzymes: No results found for this basename: CKTOTAL, CKMB, CKMBINDEX, TROPONINI,  in the last 72 hours BNP: No components found with this basename: POCBNP,  D-Dimer: No results found for this basename: DDIMER,  in the last 72 hours Hemoglobin A1C: No results found for this basename: HGBA1C,  in the last 72 hours Fasting Lipid Panel: No results found for this basename:  CHOL, HDL, LDLCALC, TRIG, CHOLHDL, LDLDIRECT,  in the last 72 hours Thyroid Function Tests: No results found for this basename: TSH, T4TOTAL, FREET3, T3FREE, THYROIDAB,  in the last 72 hours Anemia Panel: No results found for this basename: VITAMINB12, FOLATE, FERRITIN, TIBC, IRON, RETICCTPCT,  in the last 72 hours Coag Panel:   Lab Results  Component Value Date   INR 1.15 12/16/2013    RADIOLOGY: No results found.    ASSESSMENT: Renovascular HTN  PLAN:  Plan for angiogram today.  She was hydrated.  Questions answered.  She asked about a Foley, but I explained to her that we would try to avoid a catheter to decrease infection risk.  CRI: Improved with hydration.  Corky Crafts., MD  12/16/2013  8:35 AM

## 2013-12-16 NOTE — Progress Notes (Signed)
Utilization review completed.  

## 2013-12-17 ENCOUNTER — Other Ambulatory Visit: Payer: Self-pay | Admitting: Cardiology

## 2013-12-17 ENCOUNTER — Encounter (HOSPITAL_COMMUNITY): Payer: Self-pay | Admitting: Cardiology

## 2013-12-17 DIAGNOSIS — Z959 Presence of cardiac and vascular implant and graft, unspecified: Secondary | ICD-10-CM

## 2013-12-17 DIAGNOSIS — I701 Atherosclerosis of renal artery: Secondary | ICD-10-CM

## 2013-12-17 DIAGNOSIS — I150 Renovascular hypertension: Secondary | ICD-10-CM

## 2013-12-17 HISTORY — DX: Renovascular hypertension: I15.0

## 2013-12-17 LAB — CBC
HEMATOCRIT: 26.5 % — AB (ref 36.0–46.0)
HEMOGLOBIN: 8.6 g/dL — AB (ref 12.0–15.0)
MCH: 31 pg (ref 26.0–34.0)
MCHC: 32.5 g/dL (ref 30.0–36.0)
MCV: 95.7 fL (ref 78.0–100.0)
Platelets: 236 10*3/uL (ref 150–400)
RBC: 2.77 MIL/uL — ABNORMAL LOW (ref 3.87–5.11)
RDW: 12.6 % (ref 11.5–15.5)
WBC: 6.4 10*3/uL (ref 4.0–10.5)

## 2013-12-17 LAB — BASIC METABOLIC PANEL
ANION GAP: 14 (ref 5–15)
BUN: 48 mg/dL — AB (ref 6–23)
CALCIUM: 8.3 mg/dL — AB (ref 8.4–10.5)
CO2: 22 meq/L (ref 19–32)
CREATININE: 2.02 mg/dL — AB (ref 0.50–1.10)
Chloride: 104 mEq/L (ref 96–112)
GFR calc Af Amer: 26 mL/min — ABNORMAL LOW (ref >90)
GFR calc non Af Amer: 23 mL/min — ABNORMAL LOW (ref >90)
Glucose, Bld: 91 mg/dL (ref 70–99)
Potassium: 3.9 mEq/L (ref 3.7–5.3)
Sodium: 140 mEq/L (ref 137–147)

## 2013-12-17 MED ORDER — PANTOPRAZOLE SODIUM 40 MG PO TBEC: 40.0000 mg | DELAYED_RELEASE_TABLET | Freq: Every day | ORAL | Status: DC

## 2013-12-17 MED ORDER — ASPIRIN 81 MG PO TABS: 81.0000 mg | ORAL_TABLET | Freq: Every day | ORAL | Status: DC

## 2013-12-17 MED ORDER — CLOPIDOGREL BISULFATE 75 MG PO TABS: 75.0000 mg | ORAL_TABLET | Freq: Every day | ORAL | Status: DC

## 2013-12-17 NOTE — Discharge Summary (Signed)
Physician Discharge Summary       Patient ID: Patricia SavoyVirginia W Hebert MRN: [id number] DOB/AGE: [birth year] 76 y.o.  Admit date: 12/15/2013 Discharge date: 12/17/2013  Discharge Diagnoses:  Principal Problem:   Renal vascular disease Active Problems:   Renal artery stenosis, native, bilateral   S/P arterial stent, Lt renal artery 12/16/13    Chronic renal insufficiency, stage III (moderate)   Hypertension, essential   Hyperlipidemia with target LDL less than 70   Discharged Condition: good Primary PV:  Dr. Allyson SabalBerry Primary Cardiologist:  Dr. Herbie BaltimoreHarding  Procedures: 12/16/13 PV angio and stent to Lt renal artery by Dr. Crista CurbBerry  Hospital Course: [name] is a delightful 78 year old moderately overweight married Caucasian female mother of 3 living children, grandmother to 4211 grandchildren and great grandmother of 4711 great-grandchildren. She was referred by Dr. Primitivo GauzeMichael Mattingly, her nephrologist, for evaluation of potential renal vascular hypertension. Her primary care physician is Dr. Juleen ChinaKohut and her cardiologist Dr. Herbie BaltimoreHarding. She has a history of remote coronary artery bypass grafting as well as right femoropopliteal bypass grafting. Her CV risk factors include hypertension, and hyperlipidemia. She's had a stress test last year which was nonischemic. She denied chest pain, shortness of breath or claudication. Her serum creatinines runs in the mid 2 range. Recent renal Doppler study suggested moderate bilateral renal artery stenosis. After discussion with her nephrologist, it was decided to proceed with angiography using CO2 and potential intervention.   Pt tolerated procedure without complications.  She had high-grade bilateral renal artery stenosis in the setting of hypertension and renal insufficiency. Dr. Allyson SabalBerry intervened on the left renal artery given the unusual appearance of the right renal artery stenosis.  Successful left renal artery PTA and stenting for presumed renal vascular hypertension and  renal insufficiency using CO2 angiography of the abdominal aorta to conserve contrast.  Today pt without complaints,  BP elevated but has not yet had BP meds.  Ambulating without complications. Will be seen and evaluated by MD and discharged.   Consults: None  Significant Diagnostic Studies:  BMET    Component Value Date/Time   NA 140 12/17/2013 0258   K 3.9 12/17/2013 0258   CL 104 12/17/2013 0258   CO2 22 12/17/2013 0258   GLUCOSE 91 12/17/2013 0258   BUN 48* 12/17/2013 0258   CREATININE 2.02* 12/17/2013 0258   CALCIUM 8.3* 12/17/2013 0258   GFRNONAA 23* 12/17/2013 0258   GFRAA 26* 12/17/2013 0258    CBC    Component Value Date/Time   WBC 6.4 12/17/2013 0258   RBC 2.77* 12/17/2013 0258   HGB 8.6* 12/17/2013 0258   HCT 26.5* 12/17/2013 0258   PLT 236 12/17/2013 0258   MCV 95.7 12/17/2013 0258   MCH 31.0 12/17/2013 0258   MCHC 32.5 12/17/2013 0258   RDW 12.6 12/17/2013 0258       Discharge Exam: Blood pressure 146/45, pulse 69, temperature 98.3 F (36.8 C), temperature source Oral, resp. rate 18, height 5\' 3"  (1.6 m), weight 177 lb 0.5 oz (80.3 kg), SpO2 98.00%.    Disposition: Home     Medication List    STOP taking these medications       omeprazole 20 MG capsule  Commonly known as:  PRILOSEC  Replaced by:  pantoprazole 40 MG tablet     vitamin E 400 UNIT capsule      TAKE these medications       acetaminophen 325 MG tablet  Commonly known as:  TYLENOL  Take 650 mg by mouth every  6 (six) hours as needed for moderate pain.     aspirin 81 MG tablet  Take 1 tablet (81 mg total) by mouth daily.     betamethasone dipropionate 0.05 % cream  Commonly known as:  DIPROLENE  Apply 1 application topically 2 (two) times daily as needed. ECZEMA     clopidogrel 75 MG tablet  Commonly known as:  PLAVIX  Take 1 tablet (75 mg total) by mouth daily with breakfast.     CO Q 10 PO  Take 200 mg by mouth daily.     colchicine 0.6 MG tablet  Take 0.6 mg by mouth 2 (two) times  daily as needed. GOUT FLARE UP.     ezetimibe 10 MG tablet  Commonly known as:  ZETIA  Take 5 mg by mouth daily.     febuxostat 40 MG tablet  Commonly known as:  ULORIC  Take 40 mg by mouth daily.     Fenofibric Acid 45 MG Cpdr  Take 45 mg by mouth daily.     furosemide 40 MG tablet  Commonly known as:  LASIX  Take 40 mg by mouth 2 (two) times daily.     hydrALAZINE 25 MG tablet  Commonly known as:  APRESOLINE  Take 25 mg by mouth 2 (two) times daily.     ICAPS Caps  Take 1 capsule by mouth 2 (two) times daily.     LORazepam 0.5 MG tablet  Commonly known as:  ATIVAN  Take 0.5 mg by mouth 4 (four) times daily as needed for anxiety.     metoprolol 50 MG tablet  Commonly known as:  LOPRESSOR  Take 1 tablet (50 mg total) by mouth 2 (two) times daily.     multivitamin tablet  Take 1 tablet by mouth daily.     OMEGA-3 FISH OIL PO  Take 1,500 mg by mouth daily.     pantoprazole 40 MG tablet  Commonly known as:  PROTONIX  Take 1 tablet (40 mg total) by mouth daily.       Follow-up Information   Follow up with Runell Gess, MD. (our office will arrange renal doppler follow up on the Lt and then follow up with Dr. Allyson Sabal to decide how to treat the Rt renal artery)    Specialty:  Cardiology   Contact information:   [address] [place] [address] [phone]        Discharge Instructions: Call [facility] at [phone] if any bleeding, swelling or drainage at cath site.  May shower, no tub baths for 48 hours for groin sticks.   No lifting over 5 pounds for 3 days, no driving for 3 days.    Our office will call you for the follow up appts.  Heart Healthy diet.  Signed: Leone Brand Nurse Practitioner-Certified [facility]: [facility] 12/17/2013, 10:08 AM  Time spent on discharge :> 30 minutes with NP and MD time.     Pt seen & examined this AM - agree with summary.  Pt of mine referred to Dr. Dorma Russell for RAS  stenting via Dr. Briant Cedar.  She is doing well & ready for d/c.  Marykay Lex, M.D., M.S. Interventional Cardiologist   Pager # [phone] 12/17/2013

## 2013-12-17 NOTE — Discharge Instructions (Signed)
Call [facility] at [phone] if any bleeding, swelling or drainage at cath site.  May shower, no tub baths for 48 hours for groin sticks.   No lifting over 5 pounds for 3 days, no driving for 3 days.    Our office will call you for the follow up appts.  Heart Healthy diet.

## 2013-12-17 NOTE — Progress Notes (Signed)
Doing well s/p RA stenting. Groin stable & BP looks good.  OK for d/c with ROV as scheduled.  Marykay Lex, M.D., M.S. Interventional Cardiologist   Pager # [phone] 12/17/2013

## 2013-12-17 NOTE — Progress Notes (Signed)
         Subjective:  no pain, no complaints  Objective: Vital signs in last 24 hours: Temp:  [98 F (36.7 C)-99 F (37.2 C)] 98.3 F (36.8 C) (07/28 0748) Pulse Rate:  [59-82] 69 (07/28 0748) Resp:  [16-20] 18 (07/28 0748) BP: (96-176)/(32-72) 146/45 mmHg (07/28 0748) SpO2:  [98 %-100 %] 98 % (07/28 0748) Weight:  [177 lb 0.5 oz (80.3 kg)] 177 lb 0.5 oz (80.3 kg) (07/28 0546) Weight change: 1 lb 3.7 oz (0.558 kg) Last BM Date: 12/16/13 Intake/Output from previous day: -400 07/27 0701 - 07/28 0700 In: 1000 [P.O.:700; I.V.:300] Out: 1400 [Urine:1400] Intake/Output this shift:    PE: General:Pleasant affect, NAD Skin:Warm and dry, brisk capillary refill HEENT:normocephalic, sclera clear, mucus membranes moist Neck:supple, no JVD, no bruits  Heart:S1S2 RRR without murmur, gallup, rub or click Lungs:clear without rales, rhonchi, or wheezes QTM:AUQJ, non tender, + BS, do not palpate liver spleen or masses Ext:no lower ext edema, 2+ pedal pulses, 2+ radial pulses, Lt groin cath site with no hematoma, no bruising Neuro:alert and oriented x 3, MAE, follows commands, + facial symmetry   Lab Results:  Recent Labs  12/16/13 0540 12/17/13 0258  WBC 5.5 6.4  HGB 9.4* 8.6*  HCT 29.2* 26.5*  PLT 224 236   BMET  Recent Labs  12/16/13 0540 12/17/13 0258  NA 140 140  K 3.6* 3.9  CL 101 104  CO2 25 22  GLUCOSE 103* 91  BUN 54* 48*  CREATININE 2.04* 2.02*  CALCIUM 8.4 8.3*    Studies/Results: PV angio: high-grade bilateral renal artery stenosis in the setting of hypertension and renal insufficiency. I will plan on fixing the left renal artery given the unusual appearance of the right renal artery stenosis. successful left renal artery PTA and stenting for presumed renal vascular hypertension and renal insufficiency using CO2 angiography of the abdominal aorta to conserve contrast   Medications: I have reviewed the patient's current medications. Scheduled Meds: .  aspirin EC  325 mg Oral Daily  . clopidogrel  75 mg Oral Q breakfast  . ezetimibe  5 mg Oral Daily  . febuxostat  40 mg Oral Daily  . hydrALAZINE  25 mg Oral BID  . metoprolol  50 mg Oral BID  . pantoprazole  40 mg Oral Daily   Continuous Infusions:  PRN Meds:.acetaminophen, hydrALAZINE, ondansetron (ZOFRAN) IV  Assessment/Plan: Principal Problem:   Renal vascular disease Active Problems:   Renal artery stenosis, native, bilateral   S/P arterial stent, Lt renal artery 12/16/13 - on plavix and ASA -follow up renal artery dopplers on Lt and follow up with Dr. Allyson Sabal to decide on Rt renal artery.   Chronic renal insufficiency, stage III (moderate) Cr 2.02 stable today   Hypertension, essential   Hyperlipidemia with target LDL less than 70-intolerance to statins, on zetia     Anemia H/H on the 26th 9.9/31   LOS: 2 days   Time spent with pt. :15 minutes. [facility] R  Nurse Practitioner Certified Pager [phone] or after 5pm and on weekends call [phone] 12/17/2013, 9:15 AM

## 2013-12-19 ENCOUNTER — Telehealth (HOSPITAL_COMMUNITY): Payer: Self-pay | Admitting: *Deleted

## 2014-01-02 ENCOUNTER — Ambulatory Visit (HOSPITAL_COMMUNITY)
Admission: RE | Admit: 2014-01-02 | Discharge: 2014-01-02 | Disposition: A | Discharge status: Home / Self Care | Payer: Medicare Other | Source: Ambulatory Visit | Attending: Cardiovascular Disease | Admitting: Cardiovascular Disease

## 2014-01-02 DIAGNOSIS — Z9889 Other specified postprocedural states: Secondary | ICD-10-CM | POA: Diagnosis not present

## 2014-01-02 DIAGNOSIS — I701 Atherosclerosis of renal artery: Secondary | ICD-10-CM | POA: Diagnosis not present

## 2014-01-02 DIAGNOSIS — Z959 Presence of cardiac and vascular implant and graft, unspecified: Secondary | ICD-10-CM

## 2014-01-02 NOTE — Progress Notes (Signed)
Left Renal Duplex Completed. Left renal stent looks widely patent with no evidence for a significant diameter reduction. Brianna L Mazza,RVT

## 2014-01-09 ENCOUNTER — Encounter: Payer: Self-pay | Admitting: *Deleted

## 2014-01-10 ENCOUNTER — Telehealth: Payer: Self-pay | Admitting: Cardiovascular Disease

## 2014-01-13 NOTE — Telephone Encounter (Signed)
Closed encounter °

## 2014-01-14 ENCOUNTER — Ambulatory Visit: Payer: Medicare Other | Admitting: Cardiovascular Disease

## 2014-03-11 ENCOUNTER — Ambulatory Visit: Payer: Medicare Other | Admitting: Cardiovascular Disease

## 2014-03-14 ENCOUNTER — Telehealth: Payer: Self-pay | Admitting: Cardiology

## 2014-03-14 ENCOUNTER — Ambulatory Visit (INDEPENDENT_AMBULATORY_CARE_PROVIDER_SITE_OTHER): Payer: Medicare Other | Admitting: *Deleted

## 2014-03-14 VITALS — BP 150/70 | HR 77 | Ht 63.0 in | Wt 176.0 lb

## 2014-03-14 DIAGNOSIS — I2510 Atherosclerotic heart disease of native coronary artery without angina pectoris: Secondary | ICD-10-CM

## 2014-03-14 NOTE — Telephone Encounter (Signed)
Reviewed EKG - Normal EKG. No arrythmia.  Marykay Lex, MD'

## 2014-03-14 NOTE — Progress Notes (Signed)
Dr Briant Cedar called Dr Herbie Baltimore and wanted [name] to come over for an EKG.  He thought that she was in Afib.  She came into the office.  An EKG was obtained and reviewed by Dr Herbie Baltimore.  The patient was in sinus rhythm.  No changes in treatment or medication was made per Dr Herbie Baltimore.

## 2014-03-27 ENCOUNTER — Ambulatory Visit: Payer: Medicare Other | Admitting: Cardiology

## 2014-04-21 ENCOUNTER — Ambulatory Visit: Payer: Self-pay | Admitting: Podiatry

## 2014-05-01 ENCOUNTER — Encounter (HOSPITAL_COMMUNITY): Payer: Self-pay | Admitting: Cardiovascular Disease

## 2014-05-17 ENCOUNTER — Encounter: Payer: Self-pay | Admitting: Cardiology

## 2014-05-26 ENCOUNTER — Ambulatory Visit: Payer: Medicare Other | Admitting: Cardiology

## 2014-06-23 ENCOUNTER — Telehealth (HOSPITAL_COMMUNITY): Payer: Self-pay | Admitting: *Deleted

## 2014-06-24 ENCOUNTER — Telehealth (HOSPITAL_COMMUNITY): Payer: Self-pay | Admitting: *Deleted

## 2014-06-24 DIAGNOSIS — I739 Peripheral vascular disease, unspecified: Secondary | ICD-10-CM

## 2014-06-24 NOTE — Telephone Encounter (Signed)
Pt states that since seeing Dr. Allyson Sabal and then being turned back over to Dr. Herbie Baltimore the issues she had with her legs have fallen through the cracks. She would like to know if she needs to have a doppler on her legs prior to her appt with Dr. Herbie Baltimore in March.

## 2014-07-01 NOTE — Telephone Encounter (Signed)
RN reviewed patient  CHL  CHART- Patient had a  SFA -below knee pop.in Dec 2007. Unable locate in recent lower arterial  Doppler's or a follow up appointment from Dr Candie Chroman office regarding doppler. Patient  need's appointment for renal doppler as well Will defer to Dr Herbie Baltimore.

## 2014-07-01 NOTE — Telephone Encounter (Signed)
YES, MAY SCHEDULE LEA DOPPLER AND RENAL PRIOR TO PATIENT'S OFFICE APPOINTMENT WITH DR Herbie Baltimore

## 2014-07-01 NOTE — Telephone Encounter (Signed)
Yes - we should check LEA dopplers  [facility]

## 2014-07-22 ENCOUNTER — Ambulatory Visit (HOSPITAL_COMMUNITY)
Admission: RE | Admit: 2014-07-22 | Discharge: 2014-07-22 | Disposition: A | Discharge status: Home / Self Care | Payer: Medicare Other | Source: Ambulatory Visit | Attending: Cardiology | Admitting: Cardiology

## 2014-07-22 DIAGNOSIS — I739 Peripheral vascular disease, unspecified: Secondary | ICD-10-CM | POA: Diagnosis present

## 2014-07-22 NOTE — Progress Notes (Signed)
Lower Extremity Arterial Duplex Completed. °Brianna L Mazza,RVT °

## 2014-07-29 ENCOUNTER — Ambulatory Visit (INDEPENDENT_AMBULATORY_CARE_PROVIDER_SITE_OTHER): Payer: Medicare Other | Admitting: Cardiology

## 2014-07-29 ENCOUNTER — Encounter: Payer: Self-pay | Admitting: Cardiology

## 2014-07-29 VITALS — BP 134/60 | HR 76 | Ht 62.0 in | Wt 176.7 lb

## 2014-07-29 DIAGNOSIS — I701 Atherosclerosis of renal artery: Secondary | ICD-10-CM | POA: Diagnosis not present

## 2014-07-29 DIAGNOSIS — I2510 Atherosclerotic heart disease of native coronary artery without angina pectoris: Secondary | ICD-10-CM

## 2014-07-29 DIAGNOSIS — I739 Peripheral vascular disease, unspecified: Secondary | ICD-10-CM

## 2014-07-29 DIAGNOSIS — N183 Chronic kidney disease, stage 3 unspecified: Secondary | ICD-10-CM

## 2014-07-29 DIAGNOSIS — I10 Essential (primary) hypertension: Secondary | ICD-10-CM

## 2014-07-29 DIAGNOSIS — E785 Hyperlipidemia, unspecified: Secondary | ICD-10-CM | POA: Diagnosis not present

## 2014-07-29 DIAGNOSIS — N189 Chronic kidney disease, unspecified: Secondary | ICD-10-CM

## 2014-07-29 NOTE — Progress Notes (Signed)
PCP: Dwan Bolt, MD  Clinic Note: Chief Complaint  Patient presents with  . Follow-up    1 year. no complaints  . Coronary Artery Disease  . PAD   HPI: [name] is a 79 y.o. female with a PMH below who presents today for cardiology followup of her native CAD status post CABG. She also has PAD and renovascular hypertension. I last saw her, she was being referred to Dr. Gwenlyn Found for her renal artery stent placement. Otherwise I saw her a year ago in January. As you recall she is a former patient of Dr. Glade Lloyd who I first met in December 2013. She has a calculated past medical history reviewed below. When she was contacted up this appointment, she asked about potentially needing to have lower extremity artery Doppler study to assess her history of PAD with femoropopliteal bypass. I was an impression that this is then followed by her vascular surgeon, but it appears that he was no longer following it..  She is emotionally arterial Dopplers performed which showed concerning roughly 90% stenosis at the end ecchymosis of the right temporal graft.  I reviewed the result findings and have summarized below in the  [facility] section for PAD.  Past Medical History  Diagnosis Date  . CAD in native artery  2007    Referred for CABG; ALT Myoview May 2008: No ischemia or infarction. Resolution of inferolateral scar. Normal EF.  . S/P CABG x 4 2007    LIMA-LAD, SVG-Distal RCA, SVG-Ramus, SVG-Diagonal; no echocardiogram done  . PAD (peripheral artery disease) 1997    Mild-moderate carotid disease; status post right SFA occlusion with Fem-Below Knee Pop bypass (Dr. Kellie Simmering); LEA Dopplers 07/2014: RIGHT - ABI 0.84, CIA/EIA 50-69%, CFA/PFA patent Fem-Pop bypass patent w/ 70-99% anastomotic stenosis, patent Pop A with 3 V runoff; LABI 0.85- L CIA/EIA patent, LPFA 70-99%, dLSFA 50-69%, patent LPopA & 3 V runoff.  . Renovascular hypertension 12/17/2013    - s/p L RA Stent; Slight improvement of L RA  flwo  . Chronic renal insufficiency, stage III (moderate)   . S/P arterial stent, Lt renal artery 12/16/13  12/17/2013  . Essential hypertension   . Hyperlipidemia LDL goal <70     Prior Cardiac Evaluation and Past Surgical History: Past Surgical History  Procedure Laterality Date  . Cardiac catheterization  December 2007    LAD-90% mid, D19 percent ostial. Circumflex-OM1 90% ostial, 80% mid. RCA 9% proximal, 100% mid  . Femoropopliteal bypass Right 1997    Dr. Kellie Simmering: SFA-below knee Pop  . Coronary artery bypass graft  December 2007    LIMA-LAD, SVG-distal RCA, SVG-D1, SVG-OM1/Ramus  . Nm myoview ltd  April 2014    EF 63%, mild fixed anteroapical defect thought to be breast attenuation  . Appendectomy    . Tonsillectomy    . Cataract surgery    . Rectovaginal fistula repair    . [facility]  1981  . Renal artery stent Left 12/16/13    using CO2  . Lower extremity venous doppler  06/13/2011    No evidence of thrombus or thrombophlebitis, no venous insuffiency noted.  . Cardiovascular stress test  08/28/2012    Normal stress nuclear study with likely breast attenuation, low risk stress test.  . Renal angiogram N/A 12/16/2013    Procedure: RENAL ANGIOGRAM;  Surgeon: Lorretta Harp, MD;  Location: [facility] CATH LAB;  Service: Cardiovascular;  Laterality: N/A;  . Percutaneous stent intervention Left 12/16/2013    Procedure: PERCUTANEOUS  STENT INTERVENTION - LEFT RENAL ARTERY;  Surgeon: Lorretta Harp, MD;  Location: [facility] CATH LAB;  Service: Cardiovascular;  Laterality: Left;  renal   Interval History: She comes in today with no active cardiac or relieving cardiovascular complaints. She has not had any major issues for quite a while. She is walking almost 12 miles at a time. She can go up and down stairs without problems. She denies any resting or exertional chest tightness/pressure or dyspnea. No real active heart failure symptoms of PND, orthopnea or edema. Despite having abnormal motion or Doppler as  it does not appear that she has symptoms that would be consistent with claudication. She denies any palpitations, lightheadedness, dizziness, weakness or syncope/near syncope. No TIA/amaurosis fugax symptoms.  ROS: A comprehensive was performed. Review of Systems  Constitutional: Negative for weight loss and malaise/fatigue.  HENT: Negative for nosebleeds.   Respiratory: Negative for cough, shortness of breath and wheezing.   Cardiovascular: Negative for claudication and leg swelling.  Gastrointestinal: Negative for constipation and blood in stool.  Genitourinary: Negative for hematuria.  Neurological: Negative for dizziness and loss of consciousness.  Endo/Heme/Allergies: Bruises/bleeds easily.  Psychiatric/Behavioral: Negative.   All other systems reviewed and are negative.   Current Outpatient Prescriptions on File Prior to Visit  Medication Sig Dispense Refill  . acetaminophen (TYLENOL) 325 MG tablet Take 650 mg by mouth every 6 (six) hours as needed for moderate pain.    Marland Kitchen aspirin 81 MG tablet Take 1 tablet (81 mg total) by mouth daily.    . betamethasone dipropionate (DIPROLENE) 0.05 % cream Apply 1 application topically 2 (two) times daily as needed. ECZEMA    . Choline Fenofibrate (FENOFIBRIC ACID) 45 MG CPDR Take 45 mg by mouth daily.    . clopidogrel (PLAVIX) 75 MG tablet Take 1 tablet (75 mg total) by mouth daily with breakfast. 30 tablet 11  . Coenzyme Q10 (CO Q 10 PO) Take 200 mg by mouth daily.     . colchicine 0.6 MG tablet Take 0.6 mg by mouth 2 (two) times daily as needed. GOUT FLARE UP.    . ezetimibe (ZETIA) 10 MG tablet Take 5 mg by mouth daily.    . febuxostat (ULORIC) 40 MG tablet Take 40 mg by mouth daily.    . furosemide (LASIX) 40 MG tablet Take 40 mg by mouth 2 (two) times daily.    . hydrALAZINE (APRESOLINE) 25 MG tablet Take 25 mg by mouth 2 (two) times daily.    Marland Kitchen LORazepam (ATIVAN) 0.5 MG tablet Take 0.5 mg by mouth 4 (four) times daily as needed for  anxiety.    . metoprolol (LOPRESSOR) 50 MG tablet Take 1 tablet (50 mg total) by mouth 2 (two) times daily. 180 tablet 3  . Multiple Vitamin (MULTIVITAMIN) tablet Take 1 tablet by mouth daily.    . Multiple Vitamins-Minerals (ICAPS) CAPS Take 1 capsule by mouth 2 (two) times daily.    . Omega-3 Fatty Acids (OMEGA-3 FISH OIL PO) Take 1,500 mg by mouth daily.    . pantoprazole (PROTONIX) 40 MG tablet Take 1 tablet (40 mg total) by mouth daily. 30 tablet 6   No current facility-administered medications on file prior to visit.   Allergies  Allergen Reactions  . Angiotensin Receptor Blockers   . Latex Swelling  . Statins Other (See Comments)    Diffuse cramping; has tried Lipitor, Crestor, Pravachol and simvastatin  . Benicar [Olmesartan] Itching and Rash   History  Substance Use Topics  .  Smoking status: Former Smoker    Types: Cigarettes    Quit date: 05/31/1993  . Smokeless tobacco: Never Used  . Alcohol Use: 4.2 oz/week    7 Glasses of wine per week   Family History  Problem Relation Age of Onset  . Breast cancer Mother   . Heart attack Father   . Cancer - Cervical Brother     Wt Readings from Last 3 Encounters:  07/29/14 176 lb 11.2 oz (80.151 kg)  03/14/14 176 lb (79.833 kg)  12/17/13 177 lb 0.5 oz (80.3 kg)    PHYSICAL EXAM BP 134/60 mmHg  Pulse 76  Ht _0  (1.575 m)  Wt 176 lb 11.2 oz (80.151 kg)  BMI 32.31 kg/m2 General appearance: alert and oriented x3, cooperative, appears younger than stated age and no distress; well-nourished, well-groomed.  Mildly obese HEENT: [place]/AT, EOMI, MMM, anicteric sclera Neck: no adenopathy, no carotid bruit, no JVD and supple, symmetrical, trachea midline Lungs: clear to auscultation bilaterally, normal percussion bilaterally and nonlabored, good air movement Heart: regular rate and rhythm, S1, S2 normal, no murmur, click, rub or gallop and normal apical impulse Abdomen: soft, non-tender; bowel sounds normal; no masses, no  organomegaly Extremities: extremities normal, atraumatic, no cyanosis or edema and varicose veins noted; there is some mild tenderness and swelling right ankle. Pulses:mildly diminished,but difficult to palpate.  Soft B femoral bruits. Neurologic: Grossly normal   Adult ECG Report - performed & read.  Rate: 76 ;  Rhythm: normal sinus rhythm, sinus arrhythmia and 1 AVB -marked sinus arrhythmia. LVH. Computer read a possible anterior infarct is not sensitive Simply related to low R wave progression  Narrative Interpretation: stable EKG  Recent Labs:  PCP is due to check.   ASSESSMENT / PLAN: PAD (peripheral artery disease) Despite abnormal finding on extremity Dopplers, she is not noticing any claudication symptoms. I am however concerned that there may be a possible 90% anastomotic lesion from her femoropopliteal bypass. I reviewed the findings on the Dopplers with Dr. Quay Burow (vascular medicine specialist/interventionalists) who has been treating the patient for renal artery stent as well.  She is due to see him this summer. It was post in July, however I will move up the appointment to the mid June timeframe which they can discuss both the renal artery disease as well as her PAD. In the absence of symptoms, I think we have time of and do not wash to consider peripheral angiography. Would consider possible CO2 angiography given her baseline renal dysfunction.   RAS (renal artery stenosis) Status post left renal stent last summer. There was no major problem with this procedure. She hasn't noted well-controlled blood pressures. The intervention was performed based on her nephrologists recommendation. She is due to have renal artery Dopplers prior to her followup with Dr. Gwenlyn Found. We will move those studies up as well in order to have been completed prior to seeing him in return.   Hypertension, essential Well-controlled she is now on hydralazine and metoprolol.   Hyperlipidemia with  target LDL less than 70 She is on fenofibrate. Unfortunately have not seen recent lab check. We talked about potentially increasing to full dose fibroid based on her labs. Under defer this to her PCP. Otherwise we will need to check her labs.   Chronic renal insufficiency, stage III (moderate) She is followed by nephrology. Baseline creatinine is roughly 2. If she does need angiography, may consider CO2 angiography.   CAD in native artery - CABG x4 (LIMA-LAD, SVG-distal  RCA, SVG-diagonal, SVG-Ramus Relatively asymptomatic from a cardiac standpoint. Myoview in 2014 was negative for his anemia. There is a fixed anteroapical defect. On aspirin, beta blocker and hydralazine. Asymptomatic.     Orders Placed This Encounter  Procedures  . EKG 12-Lead   No orders of the defined types were placed in this encounter.     Followup: January 2017   Leonie Man, M.D., M.S. Interventional Cardiologist   Pager # [phone]

## 2014-07-29 NOTE — Patient Instructions (Signed)
Your physician wants you to follow-up in: January 2017. You will receive a reminder letter in the mail two months in advance. If you don't receive a letter, please call our office to schedule the follow-up appointment.  Dr. Herbie Baltimore would like you to see Dr. Allyson Sabal sooner than your tentative appointment date in July - possibly May or June.  He would like to move the tentative date of your renal dopplers to an earlier date as well, prior to your office visit with Dr. Allyson Sabal in May or June.

## 2014-07-30 ENCOUNTER — Encounter: Payer: Self-pay | Admitting: Cardiology

## 2014-07-30 ENCOUNTER — Telehealth: Payer: Self-pay | Admitting: Cardiology

## 2014-07-30 NOTE — Assessment & Plan Note (Signed)
She is followed by nephrology. Baseline creatinine is roughly 2. If she does need angiography, may consider CO2 angiography.

## 2014-07-30 NOTE — Assessment & Plan Note (Signed)
Well-controlled she is now on hydralazine and metoprolol.

## 2014-07-30 NOTE — Assessment & Plan Note (Addendum)
Relatively asymptomatic from a cardiac standpoint. Myoview in 2014 was negative for his anemia. There is a fixed anteroapical defect. On aspirin, beta blocker and hydralazine. Asymptomatic.

## 2014-07-30 NOTE — Telephone Encounter (Signed)
Spoke with patient.  She wants to move up renal dopplers so that Dr Allyson Sabal can go over the results in April. Message sent to [facility].

## 2014-07-30 NOTE — Assessment & Plan Note (Signed)
Status post left renal stent last summer. There was no major problem with this procedure. She hasn't noted well-controlled blood pressures. The intervention was performed based on her nephrologists recommendation. She is due to have renal artery Dopplers prior to her followup with Dr. Allyson Sabal. We will move those studies up as well in order to have been completed prior to seeing him in return.

## 2014-07-30 NOTE — Telephone Encounter (Signed)
Returning your call. °

## 2014-07-30 NOTE — Assessment & Plan Note (Signed)
She is on fenofibrate. Unfortunately have not seen recent lab check. We talked about potentially increasing to full dose fibroid based on her labs. Under defer this to her PCP. Otherwise we will need to check her labs.

## 2014-07-30 NOTE — Assessment & Plan Note (Signed)
Despite abnormal finding on extremity Dopplers, she is not noticing any claudication symptoms. I am however concerned that there may be a possible 90% anastomotic lesion from her femoropopliteal bypass. I reviewed the findings on the Dopplers with Dr. Nanetta Batty (vascular medicine specialist/interventionalists) who has been treating the patient for renal artery stent as well.  She is due to see him this summer. It was post in July, however I will move up the appointment to the mid June timeframe which they can discuss both the renal artery disease as well as her PAD. In the absence of symptoms, I think we have time of and do not wash to consider peripheral angiography. Would consider possible CO2 angiography given her baseline renal dysfunction.

## 2014-09-08 ENCOUNTER — Ambulatory Visit (HOSPITAL_COMMUNITY)
Admission: RE | Admit: 2014-09-08 | Discharge: 2014-09-08 | Disposition: A | Discharge status: Home / Self Care | Payer: Medicare Other | Source: Ambulatory Visit | Attending: Cardiovascular Disease | Admitting: Cardiovascular Disease

## 2014-09-08 DIAGNOSIS — I701 Atherosclerosis of renal artery: Secondary | ICD-10-CM | POA: Insufficient documentation

## 2014-09-08 DIAGNOSIS — Z48812 Encounter for surgical aftercare following surgery on the circulatory system: Secondary | ICD-10-CM | POA: Diagnosis not present

## 2014-09-08 DIAGNOSIS — I10 Essential (primary) hypertension: Secondary | ICD-10-CM | POA: Diagnosis not present

## 2014-09-08 NOTE — Progress Notes (Signed)
Renal Duplex Completed. Blessing Zaucha, BS, RDMS, RVT  

## 2014-09-09 ENCOUNTER — Ambulatory Visit (INDEPENDENT_AMBULATORY_CARE_PROVIDER_SITE_OTHER): Payer: Medicare Other | Admitting: Cardiovascular Disease

## 2014-09-09 ENCOUNTER — Encounter: Payer: Self-pay | Admitting: Cardiovascular Disease

## 2014-09-09 VITALS — BP 148/62 | HR 66 | Ht 63.0 in | Wt 177.0 lb

## 2014-09-09 DIAGNOSIS — I739 Peripheral vascular disease, unspecified: Secondary | ICD-10-CM

## 2014-09-09 DIAGNOSIS — I701 Atherosclerosis of renal artery: Secondary | ICD-10-CM | POA: Diagnosis not present

## 2014-09-09 NOTE — Assessment & Plan Note (Signed)
History of peripheral arterial disease status post right femoropopliteal bypass grafting by Dr. Hart Rochester in 1997. We've been following her Doppler studies in our office which most recently performed 07/22/14 revealed a right ABI 0.84 with a high-frequency signal at the distal anastomosis of her femoropopliteal bypass graft. She denies claudication. Given the fact that she is asymptomatic and has moderate renal insufficiency we discussed a conservative/watchful waiting approach. I'm going to repeat her lower extremity until Dopplers in 6 months and we'll see her back after that. Should she develop recurrent symptoms before that I would entertain doing angiography with limited contrast.

## 2014-09-09 NOTE — Patient Instructions (Signed)
  We will see you back in follow up in 6 months with Dr Allyson Sabal.   Dr Allyson Sabal has ordered: 1. Lower extremity arterial doppler (to be done in 6 months)- During this test, ultrasound is used to evaluate arterial blood flow in the legs. Allow approximately one hour for this exam.

## 2014-09-09 NOTE — Assessment & Plan Note (Signed)
Status post left renal artery stenting by myself 12/16/13 with known residual right renal artery stenosis. She sees Dr. Briant Cedar, her nephrologist, who is monitoring her renal function has remained stable.

## 2014-09-09 NOTE — Progress Notes (Signed)
09/09/2014 [name]   09/20/1935  [id number]  Primary Physician Patricia Sites, MD Primary Cardiologist: Patricia Gess MD [name]   HPI:  [name] is a delightful 79 year old moderately overweight married Caucasian female mother of 3 living children, grandmother to 47 grandchildren and great grandmother of 40 great-grandchildren. She was referred by Dr. Primitivo Hebert, her nephrologist, for evaluation of potential renal vascular hypertension. I last saw her in the office in July of last year.Her primary care physician is Dr. Juleen Hebert and her cardiologist Dr. Herbie Hebert.she has a history of remote coronary artery bypass grafting as well as right femoropopliteal bypass grafting. Protons include hypertension, and hyperlipidemia. She's had a stress test last year which was nonischemic. She denies chest pain, shortness of breath or claudication. Her serum creatinines run in the mid 2 range.I performed abdominal angiography, selective renal angiography as well as left renal artery stenting 12/16/13 using CO2 angiography. I obtained an excellent angiography result on her left renal artery. Follow-up renal Dopplers revealed this to be widely patent with residual right renal artery stenosis. She is followed by Dr. Briant Hebert and apparently her renal function has remained stable as well. She had recent lower extremity arterial Doppler studies performed 07/22/14 revealing a right ABI 0.84 with a high fever to signal in her distal anastomosis of her right femoropopliteal bypass graft. She is asymptomatic from this.  Current Outpatient Prescriptions  Medication Sig Dispense Refill  . acetaminophen (TYLENOL) 325 MG tablet Take 650 mg by mouth every 6 (six) hours as needed for moderate pain.    . Alirocumab 75 MG/ML SOPN Inject 75 mg into the skin every 14 (fourteen) days. Will start 09/11/14    . aspirin 81 MG tablet Take 1 tablet (81 mg total) by mouth daily.    . betamethasone  dipropionate (DIPROLENE) 0.05 % cream Apply 1 application topically 2 (two) times daily as needed. ECZEMA    . Choline Fenofibrate (FENOFIBRIC ACID) 45 MG CPDR Take 45 mg by mouth daily.    . clopidogrel (PLAVIX) 75 MG tablet Take 1 tablet (75 mg total) by mouth daily with breakfast. 30 tablet 11  . Coenzyme Q10 (CO Q 10 PO) Take 200 mg by mouth daily.     . colchicine 0.6 MG tablet Take 0.6 mg by mouth 2 (two) times daily as needed. GOUT FLARE UP.    . ezetimibe (ZETIA) 10 MG tablet Take 5 mg by mouth daily.    . febuxostat (ULORIC) 40 MG tablet Take 40 mg by mouth daily.    . furosemide (LASIX) 40 MG tablet Take 40 mg by mouth 2 (two) times daily.    . hydrALAZINE (APRESOLINE) 25 MG tablet Take by mouth 2 (two) times daily. 1 tablet in the am and 2 tablets in the pm    . LORazepam (ATIVAN) 0.5 MG tablet Take 0.5 mg by mouth 4 (four) times daily as needed for anxiety.    . metoprolol (LOPRESSOR) 50 MG tablet Take 1 tablet (50 mg total) by mouth 2 (two) times daily. 180 tablet 3  . Multiple Vitamin (MULTIVITAMIN) tablet Take 1 tablet by mouth daily.    . Multiple Vitamins-Minerals (ICAPS) CAPS Take 1 capsule by mouth 2 (two) times daily.    . Omega-3 Fatty Acids (OMEGA-3 FISH OIL PO) Take 1,500 mg by mouth daily.    . pantoprazole (PROTONIX) 40 MG tablet Take 1 tablet (40 mg total) by mouth daily. 30 tablet 6   No current facility-administered medications  for this visit.    Allergies  Allergen Reactions  . Angiotensin Receptor Blockers   . Latex Swelling  . Statins Other (See Comments)    Diffuse cramping; has tried Lipitor, Crestor, Pravachol and simvastatin  . Benicar [Olmesartan] Itching and Rash    History   Social History  . Marital Status: Married    Spouse Name: N/A  . Number of Children: N/A  . Years of Education: N/A   Occupational History  . Not on file.   Social History Main Topics  . Smoking status: Former Smoker    Types: Cigarettes    Quit date: 05/31/1993  .  Smokeless tobacco: Never Used  . Alcohol Use: 4.2 oz/week    7 Glasses of wine per week  . Drug Use: No  . Sexual Activity: Not on file   Other Topics Concern  . Not on file   Social History Narrative   She is a married mother of 4 with one child that died at a young age. She has 11 grandchildren and 11 great grandchildren. She is not excessively active. She does get around the house. No routine exercise   She quit smoking in 1993, and has a social alcohol beverage on occasion.   She is a former Hotel manager working as a Lawyer.     Review of Systems: General: negative for chills, fever, night sweats or weight changes.  Cardiovascular: negative for chest pain, dyspnea on exertion, edema, orthopnea, palpitations, paroxysmal nocturnal dyspnea or shortness of breath Dermatological: negative for rash Respiratory: negative for cough or wheezing Urologic: negative for hematuria Abdominal: negative for nausea, vomiting, diarrhea, bright red blood per rectum, melena, or hematemesis Neurologic: negative for visual changes, syncope, or dizziness All other systems reviewed and are otherwise negative except as noted above.    Blood pressure 148/62, pulse 66, height  (1.6 m), weight 177 lb (80.287 kg).  General appearance: alert and no distress Neck: no adenopathy, no carotid bruit, no JVD, supple, symmetrical, trachea midline and thyroid not enlarged, symmetric, no tenderness/mass/nodules Lungs: clear to auscultation bilaterally Heart: regular rate and rhythm, S1, S2 normal, no murmur, click, rub or gallop Extremities: extremities normal, atraumatic, no cyanosis or edema and 2+ right pedal pulse  EKG nnot performed today  ASSESSMENT AND PLAN:   S/P arterial stent, Lt renal artery 12/16/13  Status post left renal artery stenting by myself 12/16/13 with known residual right renal artery stenosis. She sees Dr. Briant Hebert, her nephrologist, who is monitoring her  renal function has remained stable.   PAD (peripheral artery disease) History of peripheral arterial disease status post right femoropopliteal bypass grafting by [name] in 1997. We've been following her Doppler studies in our office which most recently performed 07/22/14 revealed a right ABI 0.84 with a high-frequency signal at the distal anastomosis of her femoropopliteal bypass graft. She denies claudication. Given the fact that she is asymptomatic and has moderate renal insufficiency we discussed a conservative/watchful waiting approach. I'm going to repeat her lower extremity until Dopplers in 6 months and we'll see her back after that. Should she develop recurrent symptoms before that I would entertain doing angiography with limited contrast.       Patricia Gess MD [facility], [facility] 09/09/2014 11:43 AM

## 2014-10-13 ENCOUNTER — Encounter (HOSPITAL_COMMUNITY): Payer: Medicare Other

## 2014-10-15 ENCOUNTER — Ambulatory Visit: Payer: Medicare Other | Admitting: Cardiovascular Disease

## 2014-10-26 ENCOUNTER — Other Ambulatory Visit: Payer: Self-pay | Admitting: Cardiology

## 2014-10-27 NOTE — Telephone Encounter (Signed)
Rx has been sent to the pharmacy electronically. ° °

## 2014-11-10 ENCOUNTER — Other Ambulatory Visit: Payer: Self-pay | Admitting: Cardiology

## 2014-11-10 NOTE — Telephone Encounter (Signed)
E-sent  To pharmacy

## 2015-02-12 ENCOUNTER — Telehealth: Payer: Self-pay | Admitting: Cardiovascular Disease

## 2015-02-12 NOTE — Telephone Encounter (Signed)
Received records from [facility] for appointment on 03/10/15 with Dr Allyson Sabal.  Records given to [facility] (medical records) for Dr Hazle Coca schedule on 03/10/15. lp

## 2015-02-18 ENCOUNTER — Other Ambulatory Visit: Payer: Self-pay | Admitting: Cardiovascular Disease

## 2015-02-18 DIAGNOSIS — I701 Atherosclerosis of renal artery: Secondary | ICD-10-CM

## 2015-02-23 ENCOUNTER — Ambulatory Visit (HOSPITAL_COMMUNITY)
Admission: RE | Admit: 2015-02-23 | Discharge: 2015-02-23 | Disposition: A | Discharge status: Home / Self Care | Payer: Medicare Other | Source: Ambulatory Visit | Attending: Cardiology | Admitting: Cardiology

## 2015-02-23 DIAGNOSIS — I129 Hypertensive chronic kidney disease with stage 1 through stage 4 chronic kidney disease, or unspecified chronic kidney disease: Secondary | ICD-10-CM | POA: Insufficient documentation

## 2015-02-23 DIAGNOSIS — I701 Atherosclerosis of renal artery: Secondary | ICD-10-CM

## 2015-02-23 DIAGNOSIS — E785 Hyperlipidemia, unspecified: Secondary | ICD-10-CM | POA: Insufficient documentation

## 2015-02-23 DIAGNOSIS — N183 Chronic kidney disease, stage 3 (moderate): Secondary | ICD-10-CM | POA: Insufficient documentation

## 2015-02-23 DIAGNOSIS — Z87891 Personal history of nicotine dependence: Secondary | ICD-10-CM | POA: Diagnosis not present

## 2015-02-24 ENCOUNTER — Telehealth: Payer: Self-pay | Admitting: Cardiovascular Disease

## 2015-02-24 NOTE — Telephone Encounter (Signed)
[name] is calling because she had a renal ultrasound done on yesterday and both sides are so sore and they are bruise . There is a knot on one side . Please call   Thanks

## 2015-02-24 NOTE — Telephone Encounter (Signed)
Patient reports she did not have a good experience during her renal U/S. Said the technician left her black and blue. She has had 6 renal U/S in the past and has never been left in this shape. She does not want anyone else to go thru what she did.   She did not need any further assistance.   Message routed to [facility] to address.

## 2015-02-25 NOTE — Telephone Encounter (Signed)
I reviewed the images, and found that total scan time was 50 minutes.  I followed up with [name] Forensic scientist), and [name] (performing tech) about the circumstances.  Then I called the patient back to discuss her experience.  She felt the performing tech was brusk and unprofessional, and very rough, and did not seem to know what she was doing.  She felt the proctoring tech was extremely professional, and took over when she sensed the patient was uncomfortable.  I reassured the patient that I will follow up with the performing tech, and apologized for her discomfort, and poor experience, as that is not something we ever want for a patient.  I left my name and number with her, and assured her this would not happen again. I thanked her for letting us be aware so we can take corrective action, and prevent this type of experience for others.

## 2015-03-10 ENCOUNTER — Ambulatory Visit (INDEPENDENT_AMBULATORY_CARE_PROVIDER_SITE_OTHER): Payer: Medicare Other | Admitting: Cardiovascular Disease

## 2015-03-10 ENCOUNTER — Encounter: Payer: Self-pay | Admitting: Cardiovascular Disease

## 2015-03-10 DIAGNOSIS — I701 Atherosclerosis of renal artery: Secondary | ICD-10-CM | POA: Diagnosis not present

## 2015-03-10 DIAGNOSIS — I739 Peripheral vascular disease, unspecified: Secondary | ICD-10-CM

## 2015-03-10 DIAGNOSIS — I10 Essential (primary) hypertension: Secondary | ICD-10-CM

## 2015-03-10 NOTE — Progress Notes (Signed)
03/10/2015 [name]   [birth year]  [id number]  Primary Physician Michiel Sites, MD Primary Cardiologist: Runell Gess MD [name]   HPI:  [name] is a delightful 79 year old moderately overweight married Caucasian female mother of 3 living children, grandmother to 56 grandchildren and great grandmother of 78 great-grandchildren. She was referred by Dr. Primitivo Gauze, her nephrologist, for evaluation of potential renal vascular hypertension. I last saw her in the office /19/16. Her primary care physician is Dr. Juleen China and her cardiologist Dr. Herbie Baltimore.she has a history of remote coronary artery bypass grafting as well as right femoropopliteal bypass grafting. Protons include hypertension, and hyperlipidemia. She's had a stress test last year which was nonischemic. She denies chest pain, shortness of breath or claudication. Her serum creatinines run in the mid 2 range.I performed abdominal angiography, selective renal angiography as well as left renal artery stenting 12/16/13 using CO2 angiography. I obtained an excellent angiography result on her left renal artery. Follow-up renal Dopplers revealed this to be widely patent with residual right renal artery stenosis. She is followed by Dr. Briant Cedar and apparently her renal function has remained stable as well. She had recent lower extremity arterial Doppler studies performed 07/22/14 revealing a right ABI 0.84 with a high fever to signal in her distal anastomosis of her right femoropopliteal bypass graft. She is asymptomatic from this. Since I saw her 6 months ago she's remained currently stable denying chest pain, shortness of breath or claudication. She was put on a PCSK9  monoclonal injectable by her primary care physician with impressive decline in her total cholesterol down to 114.   Current Outpatient Prescriptions  Medication Sig Dispense Refill  . acetaminophen (TYLENOL) 325 MG tablet Take 650 mg by  mouth every 6 (six) hours as needed for moderate pain.    . Alirocumab 75 MG/ML SOPN Inject 75 mg into the skin every 14 (fourteen) days. Will start 09/11/14    . aspirin 81 MG tablet Take 1 tablet (81 mg total) by mouth daily.    . betamethasone dipropionate (DIPROLENE) 0.05 % cream Apply 1 application topically 2 (two) times daily as needed. ECZEMA    . clopidogrel (PLAVIX) 75 MG tablet TAKE 1 TABLET (75 MG TOTAL) BY MOUTH DAILY WITH BREAKFAST. 30 tablet 11  . Coenzyme Q10 (CO Q 10 PO) Take 200 mg by mouth daily.     . colchicine 0.6 MG tablet Take 0.6 mg by mouth 2 (two) times daily as needed. GOUT FLARE UP.    . febuxostat (ULORIC) 40 MG tablet Take 40 mg by mouth daily.    . furosemide (LASIX) 40 MG tablet Take 40 mg by mouth 2 (two) times daily.    . hydrALAZINE (APRESOLINE) 25 MG tablet Take by mouth 2 (two) times daily. 1 tablet in the am and 2 tablets in the pm    . LORazepam (ATIVAN) 0.5 MG tablet Take 0.5 mg by mouth 4 (four) times daily as needed for anxiety.    . metoprolol (LOPRESSOR) 50 MG tablet Take 1 tablet (50 mg total) by mouth 2 (two) times daily. 180 tablet 3  . Multiple Vitamin (MULTIVITAMIN) tablet Take 1 tablet by mouth daily.    . Multiple Vitamins-Minerals (ICAPS) CAPS Take 1 capsule by mouth 2 (two) times daily.    . Omega-3 Fatty Acids (OMEGA-3 FISH OIL PO) Take 1,500 mg by mouth daily.    . pantoprazole (PROTONIX) 40 MG tablet Take 1 tablet (40 mg total) by mouth daily.  30 tablet 6   No current facility-administered medications for this visit.    Allergies  Allergen Reactions  . Angiotensin Receptor Blockers   . Latex Swelling  . Statins Other (See Comments)    Diffuse cramping; has tried Lipitor, Crestor, Pravachol and simvastatin  . Benicar [Olmesartan] Itching and Rash    Social History   Social History  . Marital Status: Married    Spouse Name: N/A  . Number of Children: N/A  . Years of Education: N/A   Occupational History  . Not on file.    Social History Main Topics  . Smoking status: Former Smoker    Types: Cigarettes    Quit date: 05/31/1993  . Smokeless tobacco: Never Used  . Alcohol Use: 4.2 oz/week    7 Glasses of wine per week  . Drug Use: No  . Sexual Activity: Not on file   Other Topics Concern  . Not on file   Social History Narrative   She is a married mother of 4 with one child that died at a young age. She has 11 grandchildren and 11 great grandchildren. She is not excessively active. She does get around the house. No routine exercise   She quit smoking in 1993, and has a social alcohol beverage on occasion.   She is a former Hotel manager working as a Lawyer.     Review of Systems: General: negative for chills, fever, night sweats or weight changes.  Cardiovascular: negative for chest pain, dyspnea on exertion, edema, orthopnea, palpitations, paroxysmal nocturnal dyspnea or shortness of breath Dermatological: negative for rash Respiratory: negative for cough or wheezing Urologic: negative for hematuria Abdominal: negative for nausea, vomiting, diarrhea, bright red blood per rectum, melena, or hematemesis Neurologic: negative for visual changes, syncope, or dizziness All other systems reviewed and are otherwise negative except as noted above.    Blood pressure 162/72, pulse 81, height  (1.6 m), weight 173 lb 14.4 oz (78.881 kg).  General appearance: alert and no distress Neck: no adenopathy, no carotid bruit, no JVD, supple, symmetrical, trachea midline and thyroid not enlarged, symmetric, no tenderness/mass/nodules Lungs: clear to auscultation bilaterally Heart: regular rate and rhythm, S1, S2 normal, no murmur, click, rub or gallop Extremities: extremities normal, atraumatic, no cyanosis or edema  EKG normal sinus rhythm at 81 with occasional PACs and reverse R-wave progression. I personally reviewed this EKG  ASSESSMENT AND PLAN:   S/P arterial stent, Lt renal  artery 12/16/13  History of renal artery stenosis status post left renal artery stent which I performed 12/16/13 using CO2 angiography. Her renal function has stabilized. Her blood pressure when measured at home is in the 120/60 range. Recent renal Dopplers revealed her left renal artery stent to be widely patent.  PAD (peripheral artery disease) History of mild to moderate carotid disease as well as right femoropopliteal bypass grafting by Dr. Hart Rochester with Dopplers performed 07/22/14 revealing a right ABI 0.84 with a-2 signal in both her right external iliac and the distal anastomosis however she is asymptomatic. She does have a left ABI 0.85 with distal common femoral and SFA disease. We will continue to monitor her noninvasively.      Runell Gess MD FACP,FACC,FAHA, [facility] 03/10/2015 2:34 PM

## 2015-03-10 NOTE — Assessment & Plan Note (Signed)
History of mild to moderate carotid disease as well as right femoropopliteal bypass grafting by Dr. Hart Rochester with Dopplers performed 07/22/14 revealing a right ABI 0.84 with a-2 signal in both her right external iliac and the distal anastomosis however she is asymptomatic. She does have a left ABI 0.85 with distal common femoral and SFA disease. We will continue to monitor her noninvasively.

## 2015-03-10 NOTE — Assessment & Plan Note (Signed)
History of renal artery stenosis status post left renal artery stent which I performed 12/16/13 using CO2 angiography. Her renal function has stabilized. Her blood pressure when measured at home is in the 120/60 range. Recent renal Dopplers revealed her left renal artery stent to be widely patent.

## 2015-03-10 NOTE — Patient Instructions (Signed)
Medication Instructions:  Your physician recommends that you continue on your current medications as directed. Please refer to the Current Medication list given to you today.   Labwork: none  Testing/Procedures: Your physician has requested that you have a lower extremity arterial doppler- During this test, ultrasound is used to evaluate arterial blood flow in the legs. Allow approximately one hour for this exam. MARCH 2017  Your physician has requested that you have a renal artery duplex. During this test, an ultrasound is used to evaluate blood flow to the kidneys. Allow one hour for this exam. Do not eat after midnight the day before and avoid carbonated beverages. Take your medications as you usually do. OCT. 2017    Follow-Up: Your physician wants you to follow-up in: 12 months with Dr. Allyson Sabal. You will receive a reminder letter in the mail two months in advance. If you don't receive a letter, please call our office to schedule the follow-up appointment.   Any Other Special Instructions Will Be Listed Below (If Applicable).

## 2015-03-26 ENCOUNTER — Encounter: Payer: Self-pay | Admitting: Cardiovascular Disease

## 2015-07-03 ENCOUNTER — Emergency Department (HOSPITAL_COMMUNITY): Payer: Medicare Other

## 2015-07-03 ENCOUNTER — Inpatient Hospital Stay (HOSPITAL_COMMUNITY)
Admission: EM | Admit: 2015-07-03 | Discharge: 2015-07-19 | DRG: 871 | Disposition: A | Discharge status: Home Health | Payer: Medicare Other | Attending: Internal Medicine | Admitting: Internal Medicine

## 2015-07-03 ENCOUNTER — Inpatient Hospital Stay (HOSPITAL_COMMUNITY): Payer: Medicare Other

## 2015-07-03 ENCOUNTER — Encounter (HOSPITAL_COMMUNITY): Payer: Self-pay | Admitting: *Deleted

## 2015-07-03 DIAGNOSIS — N19 Unspecified kidney failure: Secondary | ICD-10-CM

## 2015-07-03 DIAGNOSIS — I10 Essential (primary) hypertension: Secondary | ICD-10-CM | POA: Diagnosis present

## 2015-07-03 DIAGNOSIS — Z91040 Latex allergy status: Secondary | ICD-10-CM | POA: Diagnosis not present

## 2015-07-03 DIAGNOSIS — Z992 Dependence on renal dialysis: Secondary | ICD-10-CM | POA: Diagnosis not present

## 2015-07-03 DIAGNOSIS — R740 Nonspecific elevation of levels of transaminase and lactic acid dehydrogenase [LDH]: Secondary | ICD-10-CM | POA: Diagnosis present

## 2015-07-03 DIAGNOSIS — I214 Non-ST elevation (NSTEMI) myocardial infarction: Secondary | ICD-10-CM | POA: Diagnosis not present

## 2015-07-03 DIAGNOSIS — I4892 Unspecified atrial flutter: Secondary | ICD-10-CM | POA: Diagnosis present

## 2015-07-03 DIAGNOSIS — I1310 Hypertensive heart and chronic kidney disease without heart failure, with stage 1 through stage 4 chronic kidney disease, or unspecified chronic kidney disease: Secondary | ICD-10-CM | POA: Diagnosis present

## 2015-07-03 DIAGNOSIS — E7800 Pure hypercholesterolemia, unspecified: Secondary | ICD-10-CM | POA: Diagnosis present

## 2015-07-03 DIAGNOSIS — I700 Atherosclerosis of aorta: Secondary | ICD-10-CM | POA: Diagnosis present

## 2015-07-03 DIAGNOSIS — R778 Other specified abnormalities of plasma proteins: Secondary | ICD-10-CM | POA: Diagnosis present

## 2015-07-03 DIAGNOSIS — Z79899 Other long term (current) drug therapy: Secondary | ICD-10-CM | POA: Diagnosis not present

## 2015-07-03 DIAGNOSIS — I5023 Acute on chronic systolic (congestive) heart failure: Secondary | ICD-10-CM | POA: Diagnosis present

## 2015-07-03 DIAGNOSIS — Z888 Allergy status to other drugs, medicaments and biological substances status: Secondary | ICD-10-CM

## 2015-07-03 DIAGNOSIS — F419 Anxiety disorder, unspecified: Secondary | ICD-10-CM | POA: Diagnosis present

## 2015-07-03 DIAGNOSIS — I429 Cardiomyopathy, unspecified: Secondary | ICD-10-CM | POA: Diagnosis not present

## 2015-07-03 DIAGNOSIS — I420 Dilated cardiomyopathy: Secondary | ICD-10-CM | POA: Diagnosis present

## 2015-07-03 DIAGNOSIS — R0602 Shortness of breath: Secondary | ICD-10-CM | POA: Diagnosis present

## 2015-07-03 DIAGNOSIS — I2510 Atherosclerotic heart disease of native coronary artery without angina pectoris: Secondary | ICD-10-CM | POA: Diagnosis present

## 2015-07-03 DIAGNOSIS — D649 Anemia, unspecified: Secondary | ICD-10-CM | POA: Diagnosis present

## 2015-07-03 DIAGNOSIS — I272 Other secondary pulmonary hypertension: Secondary | ICD-10-CM | POA: Diagnosis present

## 2015-07-03 DIAGNOSIS — F1020 Alcohol dependence, uncomplicated: Secondary | ICD-10-CM | POA: Diagnosis present

## 2015-07-03 DIAGNOSIS — I4729 Other ventricular tachycardia: Secondary | ICD-10-CM

## 2015-07-03 DIAGNOSIS — T68XXXA Hypothermia, initial encounter: Secondary | ICD-10-CM | POA: Diagnosis not present

## 2015-07-03 DIAGNOSIS — I5022 Chronic systolic (congestive) heart failure: Secondary | ICD-10-CM | POA: Diagnosis present

## 2015-07-03 DIAGNOSIS — N186 End stage renal disease: Secondary | ICD-10-CM | POA: Diagnosis present

## 2015-07-03 DIAGNOSIS — N185 Chronic kidney disease, stage 5: Secondary | ICD-10-CM | POA: Diagnosis present

## 2015-07-03 DIAGNOSIS — Z7982 Long term (current) use of aspirin: Secondary | ICD-10-CM | POA: Diagnosis not present

## 2015-07-03 DIAGNOSIS — K760 Fatty (change of) liver, not elsewhere classified: Secondary | ICD-10-CM | POA: Diagnosis present

## 2015-07-03 DIAGNOSIS — R7401 Elevation of levels of liver transaminase levels: Secondary | ICD-10-CM | POA: Diagnosis present

## 2015-07-03 DIAGNOSIS — J9601 Acute respiratory failure with hypoxia: Secondary | ICD-10-CM | POA: Diagnosis present

## 2015-07-03 DIAGNOSIS — I739 Peripheral vascular disease, unspecified: Secondary | ICD-10-CM | POA: Diagnosis present

## 2015-07-03 DIAGNOSIS — Z87891 Personal history of nicotine dependence: Secondary | ICD-10-CM | POA: Diagnosis not present

## 2015-07-03 DIAGNOSIS — E876 Hypokalemia: Secondary | ICD-10-CM | POA: Diagnosis present

## 2015-07-03 DIAGNOSIS — Z7902 Long term (current) use of antithrombotics/antiplatelets: Secondary | ICD-10-CM | POA: Diagnosis not present

## 2015-07-03 DIAGNOSIS — R509 Fever, unspecified: Secondary | ICD-10-CM | POA: Diagnosis present

## 2015-07-03 DIAGNOSIS — I701 Atherosclerosis of renal artery: Secondary | ICD-10-CM | POA: Diagnosis present

## 2015-07-03 DIAGNOSIS — Z951 Presence of aortocoronary bypass graft: Secondary | ICD-10-CM | POA: Diagnosis not present

## 2015-07-03 DIAGNOSIS — I5042 Chronic combined systolic (congestive) and diastolic (congestive) heart failure: Secondary | ICD-10-CM | POA: Diagnosis not present

## 2015-07-03 DIAGNOSIS — I472 Ventricular tachycardia: Secondary | ICD-10-CM | POA: Diagnosis present

## 2015-07-03 DIAGNOSIS — J189 Pneumonia, unspecified organism: Secondary | ICD-10-CM | POA: Diagnosis not present

## 2015-07-03 DIAGNOSIS — J9602 Acute respiratory failure with hypercapnia: Secondary | ICD-10-CM | POA: Diagnosis present

## 2015-07-03 DIAGNOSIS — N179 Acute kidney failure, unspecified: Secondary | ICD-10-CM | POA: Diagnosis present

## 2015-07-03 DIAGNOSIS — I132 Hypertensive heart and chronic kidney disease with heart failure and with stage 5 chronic kidney disease, or end stage renal disease: Secondary | ICD-10-CM | POA: Diagnosis present

## 2015-07-03 DIAGNOSIS — I4819 Other persistent atrial fibrillation: Secondary | ICD-10-CM | POA: Diagnosis present

## 2015-07-03 DIAGNOSIS — E663 Overweight: Secondary | ICD-10-CM | POA: Diagnosis present

## 2015-07-03 DIAGNOSIS — Z6828 Body mass index (BMI) 28.0-28.9, adult: Secondary | ICD-10-CM | POA: Diagnosis not present

## 2015-07-03 DIAGNOSIS — I428 Other cardiomyopathies: Secondary | ICD-10-CM | POA: Diagnosis present

## 2015-07-03 DIAGNOSIS — R680 Hypothermia, not associated with low environmental temperature: Secondary | ICD-10-CM | POA: Diagnosis present

## 2015-07-03 DIAGNOSIS — A419 Sepsis, unspecified organism: Secondary | ICD-10-CM | POA: Diagnosis present

## 2015-07-03 DIAGNOSIS — E872 Acidosis, unspecified: Secondary | ICD-10-CM | POA: Diagnosis present

## 2015-07-03 DIAGNOSIS — I480 Paroxysmal atrial fibrillation: Secondary | ICD-10-CM | POA: Diagnosis not present

## 2015-07-03 DIAGNOSIS — I481 Persistent atrial fibrillation: Secondary | ICD-10-CM | POA: Diagnosis present

## 2015-07-03 DIAGNOSIS — I4891 Unspecified atrial fibrillation: Secondary | ICD-10-CM | POA: Diagnosis not present

## 2015-07-03 DIAGNOSIS — J180 Bronchopneumonia, unspecified organism: Secondary | ICD-10-CM | POA: Diagnosis present

## 2015-07-03 DIAGNOSIS — N189 Chronic kidney disease, unspecified: Secondary | ICD-10-CM

## 2015-07-03 DIAGNOSIS — R0600 Dyspnea, unspecified: Secondary | ICD-10-CM | POA: Diagnosis present

## 2015-07-03 DIAGNOSIS — E785 Hyperlipidemia, unspecified: Secondary | ICD-10-CM | POA: Diagnosis present

## 2015-07-03 DIAGNOSIS — N2889 Other specified disorders of kidney and ureter: Secondary | ICD-10-CM | POA: Diagnosis present

## 2015-07-03 DIAGNOSIS — R198 Other specified symptoms and signs involving the digestive system and abdomen: Secondary | ICD-10-CM

## 2015-07-03 DIAGNOSIS — R739 Hyperglycemia, unspecified: Secondary | ICD-10-CM | POA: Diagnosis present

## 2015-07-03 DIAGNOSIS — R7989 Other specified abnormal findings of blood chemistry: Secondary | ICD-10-CM | POA: Diagnosis not present

## 2015-07-03 DIAGNOSIS — R634 Abnormal weight loss: Secondary | ICD-10-CM | POA: Diagnosis present

## 2015-07-03 DIAGNOSIS — K56609 Unspecified intestinal obstruction, unspecified as to partial versus complete obstruction: Secondary | ICD-10-CM

## 2015-07-03 HISTORY — DX: Disorder of arteries and arterioles, unspecified: I77.9

## 2015-07-03 HISTORY — DX: Chronic kidney disease, stage 3 unspecified: N18.30

## 2015-07-03 HISTORY — DX: Peripheral vascular disease, unspecified: I73.9

## 2015-07-03 HISTORY — DX: Anemia, unspecified: D64.9

## 2015-07-03 HISTORY — DX: Chronic kidney disease, stage 3 (moderate): N18.3

## 2015-07-03 LAB — COMPREHENSIVE METABOLIC PANEL
ALT: 679 U/L — ABNORMAL HIGH (ref 14–54)
AST: 1408 U/L — ABNORMAL HIGH (ref 15–41)
Albumin: 3.9 g/dL (ref 3.5–5.0)
Alkaline Phosphatase: 132 U/L — ABNORMAL HIGH (ref 38–126)
Anion gap: 21 — ABNORMAL HIGH (ref 5–15)
BUN: 93 mg/dL — AB (ref 6–20)
CO2: 18 mmol/L — ABNORMAL LOW (ref 22–32)
CREATININE: 3.64 mg/dL — AB (ref 0.44–1.00)
Calcium: 8 mg/dL — ABNORMAL LOW (ref 8.9–10.3)
Chloride: 96 mmol/L — ABNORMAL LOW (ref 101–111)
GFR calc Af Amer: 13 mL/min — ABNORMAL LOW (ref >60)
GFR, EST NON AFRICAN AMERICAN: 11 mL/min — AB (ref >60)
Glucose, Bld: 93 mg/dL (ref 65–99)
Potassium: 4.6 mmol/L (ref 3.5–5.1)
SODIUM: 135 mmol/L (ref 135–145)
TOTAL PROTEIN: 6.6 g/dL (ref 6.5–8.1)
Total Bilirubin: 1.2 mg/dL (ref 0.3–1.2)

## 2015-07-03 LAB — CBC WITH DIFFERENTIAL/PLATELET
BASOS PCT: 0 %
Basophils Absolute: 0 10*3/uL (ref 0.0–0.1)
Eosinophils Absolute: 0 10*3/uL (ref 0.0–0.7)
Eosinophils Relative: 0 %
HEMATOCRIT: 30.4 % — AB (ref 36.0–46.0)
HEMOGLOBIN: 10.3 g/dL — AB (ref 12.0–15.0)
LYMPHS PCT: 11 %
Lymphs Abs: 0.9 10*3/uL (ref 0.7–4.0)
MCH: 32.6 pg (ref 26.0–34.0)
MCHC: 33.9 g/dL (ref 30.0–36.0)
MCV: 96.2 fL (ref 78.0–100.0)
MONOS PCT: 13 %
Monocytes Absolute: 1 10*3/uL (ref 0.1–1.0)
NEUTROS ABS: 5.8 10*3/uL (ref 1.7–7.7)
NEUTROS PCT: 76 %
Platelets: 186 10*3/uL (ref 150–400)
RBC: 3.16 MIL/uL — ABNORMAL LOW (ref 3.87–5.11)
RDW: 12.8 % (ref 11.5–15.5)
WBC: 7.7 10*3/uL (ref 4.0–10.5)

## 2015-07-03 LAB — TROPONIN I
TROPONIN I: 1.09 ng/mL — AB (ref <0.031)
TROPONIN I: 0.85 ng/mL — AB (ref <0.031)
Troponin I: 0.54 ng/mL (ref <0.031)

## 2015-07-03 LAB — I-STAT CG4 LACTIC ACID, ED: Lactic Acid, Venous: 3.13 mmol/L (ref 0.5–2.0)

## 2015-07-03 LAB — PROTIME-INR
INR: 1.41 (ref 0.00–1.49)
PROTHROMBIN TIME: 16.8 s — AB (ref 11.6–15.2)

## 2015-07-03 LAB — BRAIN NATRIURETIC PEPTIDE: B NATRIURETIC PEPTIDE 5: 4223.3 pg/mL — AB (ref 0.0–100.0)

## 2015-07-03 MED ORDER — CLOPIDOGREL BISULFATE 75 MG PO TABS
75.0000 mg | ORAL_TABLET | Freq: Every day | ORAL | Status: DC
  Filled 2015-07-03: qty 1

## 2015-07-03 MED ORDER — DEXTROSE 5 % IV SOLN
2.0000 g | Freq: Once | INTRAVENOUS | Status: AC
  Administered 2015-07-03: 2 g via INTRAVENOUS
  Filled 2015-07-03: qty 2

## 2015-07-03 MED ORDER — VANCOMYCIN HCL IN DEXTROSE 1-5 GM/200ML-% IV SOLN
1000.0000 mg | INTRAVENOUS | Status: DC
  Administered 2015-07-05: 1000 mg via INTRAVENOUS
  Filled 2015-07-03: qty 200

## 2015-07-03 MED ORDER — SODIUM CHLORIDE 0.9% FLUSH
3.0000 mL | Freq: Two times a day (BID) | INTRAVENOUS | Status: DC
  Administered 2015-07-04 – 2015-07-18 (×16): 3 mL via INTRAVENOUS

## 2015-07-03 MED ORDER — IPRATROPIUM-ALBUTEROL 0.5-2.5 (3) MG/3ML IN SOLN: 3.0000 mL | Freq: Three times a day (TID) | RESPIRATORY_TRACT | Status: DC

## 2015-07-03 MED ORDER — LEVALBUTEROL HCL 0.63 MG/3ML IN NEBU
0.6300 mg | INHALATION_SOLUTION | Freq: Three times a day (TID) | RESPIRATORY_TRACT | Status: DC
  Administered 2015-07-04 – 2015-07-05 (×4): 0.63 mg via RESPIRATORY_TRACT
  Filled 2015-07-03 (×4): qty 3

## 2015-07-03 MED ORDER — HEPARIN BOLUS VIA INFUSION
3000.0000 [IU] | Freq: Once | INTRAVENOUS | Status: AC
  Administered 2015-07-03: 3000 [IU] via INTRAVENOUS

## 2015-07-03 MED ORDER — ASPIRIN 81 MG PO CHEW
324.0000 mg | CHEWABLE_TABLET | Freq: Once | ORAL | Status: AC
  Administered 2015-07-03: 324 mg via ORAL
  Filled 2015-07-03: qty 4

## 2015-07-03 MED ORDER — LEVALBUTEROL HCL 0.63 MG/3ML IN NEBU: 0.6300 mg | INHALATION_SOLUTION | Freq: Four times a day (QID) | RESPIRATORY_TRACT | Status: DC | PRN

## 2015-07-03 MED ORDER — METHYLPREDNISOLONE SODIUM SUCC 125 MG IJ SOLR
60.0000 mg | Freq: Four times a day (QID) | INTRAMUSCULAR | Status: DC
  Administered 2015-07-03 – 2015-07-05 (×8): 60 mg via INTRAVENOUS
  Filled 2015-07-03: qty 0.96
  Filled 2015-07-03: qty 2
  Filled 2015-07-03 (×8): qty 0.96
  Filled 2015-07-03: qty 2

## 2015-07-03 MED ORDER — LEVALBUTEROL HCL 0.63 MG/3ML IN NEBU: 0.6300 mg | INHALATION_SOLUTION | Freq: Four times a day (QID) | RESPIRATORY_TRACT | Status: DC

## 2015-07-03 MED ORDER — ONDANSETRON HCL 4 MG PO TABS
4.0000 mg | ORAL_TABLET | Freq: Four times a day (QID) | ORAL | Status: DC | PRN
  Administered 2015-07-12: 4 mg via ORAL
  Filled 2015-07-03: qty 1

## 2015-07-03 MED ORDER — ACETAMINOPHEN 325 MG PO TABS
650.0000 mg | ORAL_TABLET | Freq: Four times a day (QID) | ORAL | Status: DC | PRN
  Administered 2015-07-03 – 2015-07-10 (×3): 650 mg via ORAL
  Filled 2015-07-03 (×3): qty 2

## 2015-07-03 MED ORDER — VANCOMYCIN HCL IN DEXTROSE 1-5 GM/200ML-% IV SOLN
1000.0000 mg | Freq: Once | INTRAVENOUS | Status: AC
  Administered 2015-07-03: 1000 mg via INTRAVENOUS
  Filled 2015-07-03: qty 200

## 2015-07-03 MED ORDER — ASPIRIN EC 81 MG PO TBEC
81.0000 mg | DELAYED_RELEASE_TABLET | Freq: Every day | ORAL | Status: DC
Start: 2015-07-04 — End: 2015-07-12
  Administered 2015-07-04 – 2015-07-12 (×9): 81 mg via ORAL
  Filled 2015-07-03 (×10): qty 1

## 2015-07-03 MED ORDER — IPRATROPIUM-ALBUTEROL 0.5-2.5 (3) MG/3ML IN SOLN
3.0000 mL | Freq: Once | RESPIRATORY_TRACT | Status: AC
  Administered 2015-07-03: 3 mL via RESPIRATORY_TRACT
  Filled 2015-07-03: qty 3

## 2015-07-03 MED ORDER — DEXTROSE 5 % IV SOLN
1.0000 g | INTRAVENOUS | Status: DC
  Administered 2015-07-04 – 2015-07-05 (×2): 1 g via INTRAVENOUS
  Filled 2015-07-03 (×2): qty 1

## 2015-07-03 MED ORDER — METOPROLOL TARTRATE 25 MG PO TABS
50.0000 mg | ORAL_TABLET | Freq: Two times a day (BID) | ORAL | Status: DC
  Administered 2015-07-04 – 2015-07-08 (×10): 50 mg via ORAL
  Filled 2015-07-03: qty 2
  Filled 2015-07-03: qty 1
  Filled 2015-07-03: qty 2
  Filled 2015-07-03: qty 1
  Filled 2015-07-03: qty 2
  Filled 2015-07-03 (×5): qty 1

## 2015-07-03 MED ORDER — SODIUM CHLORIDE 0.9 % IV SOLN
INTRAVENOUS | Status: DC
  Administered 2015-07-03: 13:00:00 via INTRAVENOUS

## 2015-07-03 MED ORDER — ONDANSETRON HCL 4 MG/2ML IJ SOLN: 4.0000 mg | Freq: Four times a day (QID) | INTRAMUSCULAR | Status: DC | PRN

## 2015-07-03 MED ORDER — ALBUTEROL SULFATE (2.5 MG/3ML) 0.083% IN NEBU
5.0000 mg | INHALATION_SOLUTION | Freq: Once | RESPIRATORY_TRACT | Status: AC
  Administered 2015-07-03: 5 mg via RESPIRATORY_TRACT
  Filled 2015-07-03: qty 6

## 2015-07-03 MED ORDER — FUROSEMIDE 10 MG/ML IJ SOLN
120.0000 mg | Freq: Three times a day (TID) | INTRAVENOUS | Status: DC
  Administered 2015-07-03 – 2015-07-05 (×6): 120 mg via INTRAVENOUS
  Filled 2015-07-03 (×2): qty 12
  Filled 2015-07-03: qty 10
  Filled 2015-07-03 (×2): qty 12
  Filled 2015-07-03: qty 10
  Filled 2015-07-03: qty 12
  Filled 2015-07-03: qty 10

## 2015-07-03 MED ORDER — ALBUTEROL SULFATE (2.5 MG/3ML) 0.083% IN NEBU: 2.5000 mg | INHALATION_SOLUTION | RESPIRATORY_TRACT | Status: DC | PRN

## 2015-07-03 MED ORDER — ACETAMINOPHEN 650 MG RE SUPP: 650.0000 mg | Freq: Four times a day (QID) | RECTAL | Status: DC | PRN

## 2015-07-03 MED ORDER — PANTOPRAZOLE SODIUM 40 MG PO TBEC
40.0000 mg | DELAYED_RELEASE_TABLET | Freq: Every day | ORAL | Status: DC
  Administered 2015-07-04 – 2015-07-19 (×16): 40 mg via ORAL
  Filled 2015-07-03 (×17): qty 1

## 2015-07-03 MED ORDER — LORAZEPAM 0.5 MG PO TABS
0.5000 mg | ORAL_TABLET | Freq: Four times a day (QID) | ORAL | Status: DC | PRN
  Administered 2015-07-07 – 2015-07-09 (×5): 0.5 mg via ORAL
  Filled 2015-07-03 (×6): qty 1

## 2015-07-03 MED ORDER — HEPARIN (PORCINE) IN NACL 100-0.45 UNIT/ML-% IJ SOLN
750.0000 [IU]/h | INTRAMUSCULAR | Status: DC
  Administered 2015-07-03: 750 [IU]/h via INTRAVENOUS
  Filled 2015-07-03: qty 250

## 2015-07-03 MED ORDER — IPRATROPIUM-ALBUTEROL 0.5-2.5 (3) MG/3ML IN SOLN
3.0000 mL | RESPIRATORY_TRACT | Status: DC
  Administered 2015-07-03 (×2): 3 mL via RESPIRATORY_TRACT
  Filled 2015-07-03 (×2): qty 3

## 2015-07-03 NOTE — Progress Notes (Signed)
ANTIBIOTIC CONSULT NOTE - INITIAL  Pharmacy Consult for Vancomycin Indication: pneumonia  Allergies  Allergen Reactions  . Angiotensin Receptor Blockers   . Latex Swelling  . Statins Other (See Comments)    Diffuse cramping; has tried Lipitor, Crestor, Pravachol and simvastatin  . Benicar [Olmesartan] Itching and Rash   Patient Measurements:   Total body weight: 63.5kg  Vital Signs: Temp: 98 F (36.7 C) (02/10 1046) Temp Source: Oral (02/10 1046) BP: 148/91 mmHg (02/10 1215) Pulse Rate: 107 (02/10 1215) Intake/Output from previous day:   Intake/Output from this shift:    Labs: No results for input(s): WBC, HGB, PLT, LABCREA, CREATININE in the last 72 hours. CrCl cannot be calculated (Unknown ideal weight.). No results for input(s): VANCOTROUGH, VANCOPEAK, VANCORANDOM, GENTTROUGH, GENTPEAK, GENTRANDOM, TOBRATROUGH, TOBRAPEAK, TOBRARND, AMIKACINPEAK, AMIKACINTROU, AMIKACIN in the last 72 hours.   Microbiology: No results found for this or any previous visit (from the past 720 hour(s)).  Medical History: Past Medical History  Diagnosis Date  . CAD in native artery  2007    Referred for CABG; ALT Myoview May 2008: No ischemia or infarction. Resolution of inferolateral scar. Normal EF.  . S/P CABG x 4 2007    LIMA-LAD, SVG-Distal RCA, SVG-Ramus, SVG-Diagonal; no echocardiogram done  . PAD (peripheral artery disease) (HCC) 1997    Mild-moderate carotid disease; status post right SFA occlusion with Fem-Below Knee Pop bypass (Dr. Hart Rochester); LEA Dopplers 07/2014: RIGHT - ABI 0.84, CIA/EIA 50-69%, CFA/PFA patent Fem-Pop bypass patent w/ 70-99% anastomotic stenosis, patent Pop A with 3 V runoff; LABI 0.85- L CIA/EIA patent, LPFA 70-99%, dLSFA 50-69%, patent LPopA & 3 V runoff.  . Renovascular hypertension 12/17/2013    - s/p L RA Stent; Slight improvement of L RA flwo  . Chronic renal insufficiency, stage III (moderate)   . S/P arterial stent, Lt renal artery 12/16/13  12/17/2013  .  Essential hypertension   . Hyperlipidemia LDL goal <70    Medications:  Scheduled:  . albuterol  5 mg Nebulization Once  . ipratropium-albuterol  3 mL Nebulization Once   Anti-infectives    Start     Dose/Rate Route Frequency Ordered Stop   07/03/15 1230  vancomycin (VANCOCIN) IVPB 1000 mg/200 mL premix     1,000 mg 200 mL/hr over 60 Minutes Intravenous  Once 07/03/15 1213     07/03/15 1215  ceFEPIme (MAXIPIME) 2 g in dextrose 5 % 50 mL IVPB     2 g 100 mL/hr over 30 Minutes Intravenous  Once 07/03/15 1207       Assessment:  83 yoF with acute worsening of SHOB, c/o URI since December treated with 3 rounds of antibiotics. Last antibiotic: Ceftin po x 10 days beginning 2/6. Vancomycin per Pharmacy, Vanc 1gm and Cefepime 2gm x1 in ED (Cefepime 1gm ordered, changed dose to 2gm for 1st dose PNA treatment).   Goal of Therapy:  Vancomycin trough level 15-20 mcg/ml  Plan:   Vancomycin x 8 days, 1gm x1 in ED, followed by 1gm q48hr  Adjust Vancomycin as renal function changes, trough at steady state if necessary  Cefepime 2gm x1 dose in ED, would suggest following with Cefepime 1gm q24 starting 2/11  Otho Bellows PharmD Pager [phone] 07/03/2015, 1:48 PM

## 2015-07-03 NOTE — Progress Notes (Signed)
ANTICOAGULATION CONSULT NOTE - Initial Consult  Pharmacy Consult for heparin Indication: chest pain/ACS and atrial fibrillation  Allergies  Allergen Reactions  . Angiotensin Receptor Blockers   . Latex Swelling  . Statins Other (See Comments)    Diffuse cramping; has tried Lipitor, Crestor, Pravachol and simvastatin  . Benicar [Olmesartan] Itching and Rash    Patient Measurements: Height: 5\' 3"  (160 cm) Weight: 140 lb (63.504 kg) IBW/kg (Calculated) : 52.4 Heparin Dosing Weight: 63 kg  Vital Signs: Temp: 98 F (36.7 C) (02/10 1046) Temp Source: Oral (02/10 1046) BP: 128/100 mmHg (02/10 1530) Pulse Rate: 76 (02/10 1530)  Labs:  Recent Labs  07/03/15 1236  HGB 10.3*  HCT 30.4*  PLT 186  LABPROT 16.8*  INR 1.41  CREATININE 3.64*  TROPONINI 1.09*    Estimated Creatinine Clearance: 11.6 mL/min (by C-G formula based on Cr of 3.64).   Medical History: Past Medical History  Diagnosis Date  . CAD in native artery 2007    a. S/p CABG 2007 (LIMA-LAD, SVG-dRCA, SVG-intermediate, SVG-diagonal). b. Nuclear stress test 08/2012: normal, low risk.  . S/P CABG x 4 2007    LIMA-LAD, SVG-Distal RCA, SVG-Ramus, SVG-Diagonal; no echocardiogram done  . PAD (peripheral artery disease) (HCC) 1997    Mild-moderate carotid disease; status post right SFA occlusion with Fem-Below Knee Pop bypass (Dr. Hart Rochester); LEA Dopplers 07/2014: RIGHT - ABI 0.84, CIA/EIA 50-69%, CFA/PFA patent Fem-Pop bypass patent w/ 70-99% anastomotic stenosis, patent Pop A with 3 V runoff; LABI 0.85- L CIA/EIA patent, LPFA 70-99%, dLSFA 50-69%, patent LPopA & 3 V runoff.  . Renovascular hypertension 12/17/2013    a. s/p L RA Stent 11/2013. b. Renal duplex 02/2015: >60% right proximal renal artery stenosis, normal left renal artery s/p stent, f/u 1 yr recommended.  . CKD (chronic kidney disease), stage III   . Essential hypertension   . Hyperlipidemia LDL goal <70   . Carotid artery disease (HCC)     a. Mild-mod carotid  disease per Dr.Berry's note.  . Anemia     Assessment: Patient's a 80 y.o F presented to the ED on 2/10 with c/o SOB.  Troponin was found to be elevated in the ED and with new onset afib.  To start heparin for Afib and r/o ACS.  Patient reported no recent bleeding events or surgical procedure.  Vancomycin and cefepime given started earlier for PNA.  Pharmacy to dose cefepime in addition to vancomycin per MD's request.    Goal of Therapy:  Heparin level 0.3-0.7 units/ml Monitor platelets by anticoagulation protocol: Yes   Plan:  - heparin 3000 units IV x1 bolus, then drip at 750 units/hr - check 8 hr heparin level - monitor for s/s bleeding - cefepime 2gm x1 given in the ED at 1244, then 1gm IV q24h - monitor renal function and adjust dose if/when appropriate  Else Habermann P 07/03/2015,4:24 PM

## 2015-07-03 NOTE — ED Notes (Signed)
Pt c/o shob and URI since December. Seen multiple times by PCP,3 rounds of abx without relief. Since last night, shob has acutely worsened. 87% on RA, placed on 2L O2, reports easier to breathe with O2 on. Audible expiratory wheezes.

## 2015-07-03 NOTE — Consult Note (Signed)
Renal Service Consult Note [facility]  [name] 07/03/2015 Delano Metz D Requesting Physician:  Dr. Rito Ehrlich, G  Reason for Consult:  CKD patient w SOB / cough/ hypoxemia HPI: The patient is a 80 y.o. year-old with hx of CAD/ CABG, PAD, HTN , HL and CKD.  Followed by Dr. Briant Cedar for CKD with baseline creat "around 2" per patient.  She presented to ED with SOB, recurrent URI since December. Also now wheezing. Has had 2-3 round of abx w/o relief.  Came to ED where RA SaO2 was 87%.    She says she has been having 102deg fevers the last few days.  Temp and wbc here are normal. Denies any chest pain, no abd pain, n/v/d.  She weighed 162 lbs 4d ago and today is 165 lbs.  She has orthopnea, "I was using 1-pillow at the beginning of teh week and now I'm sleeping with 4 pillows!" Denies hx of CHF.  Takes lasix 40 mg at home.  No prod cough, purulent sputum. Takes Praluent for high cholesterol, injections every other week, about $1500 / month per UTD. No edema.  No hemoptysis.     ROS  no joint pain  no rash  no HA  Past Medical History  Past Medical History  Diagnosis Date  . CAD in native artery 2007    a. S/p CABG 2007 (LIMA-LAD, SVG-dRCA, SVG-intermediate, SVG-diagonal). b. Nuclear stress test 08/2012: normal, low risk.  . S/P CABG x 4 2007    LIMA-LAD, SVG-Distal RCA, SVG-Ramus, SVG-Diagonal; no echocardiogram done  . PAD (peripheral artery disease) (HCC) 1997    Mild-moderate carotid disease; status post right SFA occlusion with Fem-Below Knee Pop bypass (Dr. Hart Rochester); LEA Dopplers 07/2014: RIGHT - ABI 0.84, CIA/EIA 50-69%, CFA/PFA patent Fem-Pop bypass patent w/ 70-99% anastomotic stenosis, patent Pop A with 3 V runoff; LABI 0.85- L CIA/EIA patent, LPFA 70-99%, dLSFA 50-69%, patent LPopA & 3 V runoff.  . Renovascular hypertension 12/17/2013    a. s/p L RA Stent 11/2013. b. Renal duplex 02/2015: >60% right proximal renal artery stenosis, normal left renal artery  s/p stent, f/u 1 yr recommended.  . CKD (chronic kidney disease), stage III   . Essential hypertension   . Hyperlipidemia LDL goal <70   . Carotid artery disease (HCC)     a. Mild-mod carotid disease per Dr.Berry's note.  . Anemia    Past Surgical History  Past Surgical History  Procedure Laterality Date  . Cardiac catheterization  December 2007    LAD-90% mid, D19 percent ostial. Circumflex-OM1 90% ostial, 80% mid. RCA 9% proximal, 100% mid  . Femoropopliteal bypass Right 1997    Dr. Hart Rochester: SFA-below knee Pop  . Coronary artery bypass graft  December 2007    LIMA-LAD, SVG-distal RCA, SVG-D1, SVG-OM1/Ramus  . Nm myoview ltd  April 2014    EF 63%, mild fixed anteroapical defect thought to be breast attenuation  . Appendectomy    . Tonsillectomy    . Cataract surgery    . Rectovaginal fistula repair    . [facility]  1981  . Renal artery stent Left 12/16/13    using CO2  . Lower extremity venous doppler  06/13/2011    No evidence of thrombus or thrombophlebitis, no venous insuffiency noted.  . Cardiovascular stress test  08/28/2012    Normal stress nuclear study with likely breast attenuation, low risk stress test.  . Renal angiogram N/A 12/16/2013    Procedure: RENAL ANGIOGRAM;  Surgeon: Delton See  Allyson Sabal, MD;  Location: [facility] CATH LAB;  Service: Cardiovascular;  Laterality: N/A;  . Percutaneous stent intervention Left 12/16/2013    Procedure: PERCUTANEOUS STENT INTERVENTION - LEFT RENAL ARTERY;  Surgeon: Runell Gess, MD;  Location: [facility] CATH LAB;  Service: Cardiovascular;  Laterality: Left;  renal   Family History  Family History  Problem Relation Age of Onset  . Breast cancer Mother   . Heart attack Father   . Cancer - Cervical Brother    Social History  reports that she quit smoking about 22 years ago. Her smoking use included Cigarettes. She has never used smokeless tobacco. She reports that she drinks about 4.2 oz of alcohol per week. She reports that she does not use illicit  drugs. Allergies  Allergies  Allergen Reactions  . Angiotensin Receptor Blockers   . Latex Swelling  . Statins Other (See Comments)    Diffuse cramping; has tried Lipitor, Crestor, Pravachol and simvastatin  . Benicar [Olmesartan] Itching and Rash   Home medications Prior to Admission medications   Medication Sig Start Date End Date Taking? Authorizing Provider  acetaminophen (TYLENOL) 325 MG tablet Take 650 mg by mouth every 6 (six) hours as needed for moderate pain.   Yes Historical Provider, MD  Alirocumab 75 MG/ML SOPN Inject 75 mg into the skin every 14 (fourteen) days. Will start 09/11/14   Yes Historical Provider, MD  aspirin 81 MG tablet Take 1 tablet (81 mg total) by mouth daily. 12/17/13  Yes Leone Brand, NP  betamethasone dipropionate (DIPROLENE) 0.05 % cream Apply 1 application topically 2 (two) times daily as needed. ECZEMA   Yes Historical Provider, MD  cefUROXime (CEFTIN) 500 MG tablet Take 500 mg by mouth 2 (two) times daily. 06/29/15  Yes Historical Provider, MD  chlorpheniramine-HYDROcodone (TUSSIONEX) 10-8 MG/5ML SUER Take 5 mLs by mouth every 12 (twelve) hours as needed for cough.  06/29/15  Yes Historical Provider, MD  clopidogrel (PLAVIX) 75 MG tablet TAKE 1 TABLET (75 MG TOTAL) BY MOUTH DAILY WITH BREAKFAST. 11/10/14  Yes Marykay Lex, MD  colchicine 0.6 MG tablet Take 0.6 mg by mouth 2 (two) times daily as needed. GOUT FLARE UP.   Yes Historical Provider, MD  febuxostat (ULORIC) 40 MG tablet Take 40 mg by mouth daily.   Yes Historical Provider, MD  furosemide (LASIX) 40 MG tablet Take 40 mg by mouth 2 (two) times daily.   Yes Historical Provider, MD  hydrALAZINE (APRESOLINE) 25 MG tablet Take 25-50 mg by mouth 2 (two) times daily. 1 tablet in the am and 2 tablets in the pm   Yes Historical Provider, MD  LORazepam (ATIVAN) 0.5 MG tablet Take 0.5 mg by mouth 4 (four) times daily as needed for anxiety.   Yes Historical Provider, MD  metoprolol (LOPRESSOR) 50 MG tablet  Take 1 tablet (50 mg total) by mouth 2 (two) times daily. 05/31/13  Yes Marykay Lex, MD  Multiple Vitamin (MULTIVITAMIN) tablet Take 1 tablet by mouth daily.   Yes Historical Provider, MD  Multiple Vitamins-Minerals (ICAPS) CAPS Take 1 capsule by mouth 2 (two) times daily.   Yes Historical Provider, MD  pantoprazole (PROTONIX) 40 MG tablet Take 1 tablet (40 mg total) by mouth daily. 12/17/13  Yes Leone Brand, NP   Liver Function Tests  Recent Labs Lab 07/03/15 1236  AST 1408*  ALT 679*  ALKPHOS 132*  BILITOT 1.2  PROT 6.6  ALBUMIN 3.9   No results for input(s): LIPASE, AMYLASE in the  last 168 hours. CBC  Recent Labs Lab 07/03/15 1236  WBC 7.7  NEUTROABS 5.8  HGB 10.3*  HCT 30.4*  MCV 96.2  PLT 186   Basic Metabolic Panel  Recent Labs Lab 07/03/15 1236  NA 135  K 4.6  CL 96*  CO2 18*  GLUCOSE 93  BUN 93*  CREATININE 3.64*  CALCIUM 8.0*    Filed Vitals:   07/03/15 1500 07/03/15 1522 07/03/15 1530 07/03/15 1700  BP: 148/83 148/83 128/100 132/108  Pulse: 90 78 76 97  Temp:      TempSrc:      Resp: Height:      Weight:      SpO2: 96% 96% 97% 91%   Exam Alert, coughing, raspy No rash, cyanosis or gangrene Sclera anicteric, throat clear +JVD to angle of jaw Chest diffuse bilat coarse exp wheezing and insp rales bilat bases 2/3 on L and 1/3 on R Irreg irreg rhythm no MRG Abd obese, soft ntnd no hsm noted  GU defer MS no joint chgs Ext no LE or UE edema Neuro nonfocal, no asterixis, Ox 3  Na 135  K 4.6  BUN 93  Creat 3.64   Ca 8.0  AST 1408  ALT 679 BNP 4233 Trop 1.09 > 0.85 WBC 7k Hb 10  plt 186 CXR vasc congestion, which is new from prior films. No overt edema  Assessment: 1 SOB/ cough/ hypoxemia - has stigmata of CHF w jvd/ rales and orthopnea.  Suspect this is vol overload and/or heart failure.  Needs aggressive diuresis.  ^LFT's may be congestion or meds (Praluent). Climb in creat prob decomp CHF.   2 CAD hx CABG 3 New onset  afib/ aflutter - per prim 4 HTN takes lasix/ hydral/ metop; bp good 5 Vol excess - vs CHF 6 HL on alirocumab (Praluent) injections   Plan - IV lasix, fluid restrict, f/u lft's/ creat in am  Vinson Moselle MD [facility] pager [phone]    cell [phone] 07/03/2015, 6:01 PM

## 2015-07-03 NOTE — ED Notes (Signed)
When triaging pt, noted that Dinamap pulse jumped from 120 to 40 to 90. No Hx A fib per pt. EKG ordered, showing new onset A Fib.

## 2015-07-03 NOTE — ED Notes (Signed)
Abnormal lab MD Madilyn Hook have been info

## 2015-07-03 NOTE — H&P (Signed)
[facility] History and Physical  [place] WER:[id number] DOB: 23-Dec-1935 DOA: 07/03/2015   PCP: Dwan Bolt, MD  Specialists: Dr. Mercy Moore is her nephrologist. She is also followed by Dr. Gwenlyn Found with cardiology.  Chief Complaint: Cough and shortness of breath for the last 1 week  HPI: [name] is a 80 y.o. female with a past medical history of coronary artery disease, peripheral vascular disease, chronic kidney disease stage III, who was in her usual state of health until sometime in December when she started developing a cough which was dry. She had difficulty breathing. She was seen by her provider who prescribed an antibiotic regimen. Chest x-ray did not show any pneumonia. She took these medications for 10 days. Did not feel any better. And then about 4 days ago she went back to see her primary care physician. This time she was experiencing greenish expectoration with cough. She had a fever up to 102F. She was wheezing. She was given a steroid injection. She was prescribed Ceftin. She denies any chest pain. No nausea, vomiting. She reports poor appetite over the last few days and as a result has had poor oral intake. Denies any leg swelling. She does admit to some dizziness with standing up. Denies any discomfort with urination. Has not noticed reduced urine output in the last few days. She decided to come into the hospital as her symptoms were not improving.  In the emergency department evaluation revealed elevated BNP and troponin level. Lactic acid was also elevated. WBC was normal. She was noted to have acute on chronic failure. She was also noted to have atrial fibrillation which was a new diagnosis for her.  Home Medications: Prior to Admission medications   Medication Sig Start Date End Date Taking? Authorizing Provider  acetaminophen (TYLENOL) 325 MG tablet Take 650 mg by mouth every 6 (six) hours as needed for moderate pain.   Yes Historical  Provider, MD  Alirocumab 75 MG/ML SOPN Inject 75 mg into the skin every 14 (fourteen) days. Will start 09/11/14   Yes Historical Provider, MD  aspirin 81 MG tablet Take 1 tablet (81 mg total) by mouth daily. 12/17/13  Yes Isaiah Serge, NP  betamethasone dipropionate (DIPROLENE) 0.05 % cream Apply 1 application topically 2 (two) times daily as needed. ECZEMA   Yes Historical Provider, MD  cefUROXime (CEFTIN) 500 MG tablet Take 500 mg by mouth 2 (two) times daily. 06/29/15  Yes Historical Provider, MD  chlorpheniramine-HYDROcodone (TUSSIONEX) 10-8 MG/5ML SUER Take 5 mLs by mouth every 12 (twelve) hours as needed for cough.  06/29/15  Yes Historical Provider, MD  clopidogrel (PLAVIX) 75 MG tablet TAKE 1 TABLET (75 MG TOTAL) BY MOUTH DAILY WITH BREAKFAST. 11/10/14  Yes Leonie Man, MD  colchicine 0.6 MG tablet Take 0.6 mg by mouth 2 (two) times daily as needed. GOUT FLARE UP.   Yes Historical Provider, MD  febuxostat (ULORIC) 40 MG tablet Take 40 mg by mouth daily.   Yes Historical Provider, MD  furosemide (LASIX) 40 MG tablet Take 40 mg by mouth 2 (two) times daily.   Yes Historical Provider, MD  hydrALAZINE (APRESOLINE) 25 MG tablet Take 25-50 mg by mouth 2 (two) times daily. 1 tablet in the am and 2 tablets in the pm   Yes Historical Provider, MD  LORazepam (ATIVAN) 0.5 MG tablet Take 0.5 mg by mouth 4 (four) times daily as needed for anxiety.   Yes Historical Provider, MD  metoprolol (LOPRESSOR) 50 MG tablet Take  1 tablet (50 mg total) by mouth 2 (two) times daily. 05/31/13  Yes Leonie Man, MD  Multiple Vitamin (MULTIVITAMIN) tablet Take 1 tablet by mouth daily.   Yes Historical Provider, MD  Multiple Vitamins-Minerals (ICAPS) CAPS Take 1 capsule by mouth 2 (two) times daily.   Yes Historical Provider, MD  pantoprazole (PROTONIX) 40 MG tablet Take 1 tablet (40 mg total) by mouth daily. 12/17/13  Yes Isaiah Serge, NP    Allergies:  Allergies  Allergen Reactions  . Angiotensin Receptor Blockers    . Latex Swelling  . Statins Other (See Comments)    Diffuse cramping; has tried Lipitor, Crestor, Pravachol and simvastatin  . Benicar [Olmesartan] Itching and Rash    Past Medical History: Past Medical History  Diagnosis Date  . CAD in native artery 2007    a. S/p CABG 2007 (LIMA-LAD, SVG-dRCA, SVG-intermediate, SVG-diagonal). b. Nuclear stress test 08/2012: normal, low risk.  . S/P CABG x 4 2007    LIMA-LAD, SVG-Distal RCA, SVG-Ramus, SVG-Diagonal; no echocardiogram done  . PAD (peripheral artery disease) ([place]) 1997    Mild-moderate carotid disease; status post right SFA occlusion with Fem-Below Knee Pop bypass (Dr. Kellie Simmering); LEA Dopplers 07/2014: RIGHT - ABI 0.84, CIA/EIA 50-69%, CFA/PFA patent Fem-Pop bypass patent w/ 70-99% anastomotic stenosis, patent Pop A with 3 V runoff; LABI 0.85- L CIA/EIA patent, LPFA 70-99%, dLSFA 50-69%, patent LPopA & 3 V runoff.  . Renovascular hypertension 12/17/2013    a. s/p L RA Stent 11/2013. b. Renal duplex 02/2015: >60% right proximal renal artery stenosis, normal left renal artery s/p stent, f/u 1 yr recommended.  . CKD (chronic kidney disease), stage III   . Essential hypertension   . Hyperlipidemia LDL goal <70   . Carotid artery disease ([place])     a. Mild-mod carotid disease per Dr.Berry's note.  . Anemia     Past Surgical History  Procedure Laterality Date  . Cardiac catheterization  December 2007    LAD-90% mid, D19 percent ostial. Circumflex-OM1 90% ostial, 80% mid. RCA 9% proximal, 100% mid  . Femoropopliteal bypass Right 1997    Dr. Kellie Simmering: SFA-below knee Pop  . Coronary artery bypass graft  December 2007    LIMA-LAD, SVG-distal RCA, SVG-D1, SVG-OM1/Ramus  . Nm myoview ltd  April 2014    EF 63%, mild fixed anteroapical defect thought to be breast attenuation  . Appendectomy    . Tonsillectomy    . Cataract surgery    . Rectovaginal fistula repair    . [place] [facility]  1981  . Renal artery stent Left 12/16/13    using CO2  . Lower  extremity venous doppler  06/13/2011    No evidence of thrombus or thrombophlebitis, no venous insuffiency noted.  . Cardiovascular stress test  08/28/2012    Normal stress nuclear study with likely breast attenuation, low risk stress test.  . Renal angiogram N/A 12/16/2013    Procedure: RENAL ANGIOGRAM;  Surgeon: Lorretta Harp, MD;  Location: [facility] CATH LAB;  Service: Cardiovascular;  Laterality: N/A;  . Percutaneous stent intervention Left 12/16/2013    Procedure: PERCUTANEOUS STENT INTERVENTION - LEFT RENAL ARTERY;  Surgeon: Lorretta Harp, MD;  Location: [facility] CATH LAB;  Service: Cardiovascular;  Laterality: Left;  renal    Social History: She lives in [place] with her husband. Quit smoking 20 years ago. Has about 10-20-pack-year history of smoking. No alcohol use. No illicit drug use. Independent with daily activities.  Family History:  Family History  Problem Relation Age of Onset  . Breast cancer Mother   . Heart attack Father   . Cancer - Cervical Brother      Review of Systems - History obtained from the patient General ROS: positive for  - fatigue Psychological ROS: negative Ophthalmic ROS: negative ENT ROS: negative Allergy and Immunology ROS: negative Hematological and Lymphatic ROS: negative Endocrine ROS: negative Respiratory ROS: as in hpi Cardiovascular ROS: as in hpi Gastrointestinal ROS: no abdominal pain, change in bowel habits, or black or bloody stools Genito-Urinary ROS: no dysuria, trouble voiding, or hematuria Musculoskeletal ROS: negative Neurological ROS: no TIA or stroke symptoms Dermatological ROS: negative  Physical Examination  Filed Vitals:   07/03/15 1530 07/03/15 1700 07/03/15 1800 07/03/15 1806  BP: 128/100 132/108 148/80 144/115  Pulse: 76 97 80 97  Temp:      TempSrc:      Resp: '19 20 15 18  '$ Height:      Weight:      SpO2: 97% 91% 96% 96%    BP 144/115 mmHg  Pulse 97  Temp(Src) 98 F (36.7 C) (Oral)  Resp 18  Ht '5\' 3"'$  (1.6 m)   Wt 63.504 kg (140 lb)  BMI 24.81 kg/m2  SpO2 96%  General appearance: alert, cooperative, appears stated age and no distress Head: Normocephalic, without obvious abnormality, atraumatic Eyes: conjunctivae/corneas clear. PERRL, EOM's intact.  Throat: lips, mucosa, and tongue normal; teeth and gums normal Neck: no adenopathy, no carotid bruit, no JVD, supple, symmetrical, trachea midline and thyroid not enlarged, symmetric, no tenderness/mass/nodules Resp: Scattered wheezing heard bilaterally. Coarse breath sounds. No obvious crackles. Cardio: S1, S2 is irregularly irregular. No S3, S4. No rubs, murmurs, or bruit. No pedal edema. GI: soft, non-tender; bowel sounds normal; no masses,  no organomegaly Extremities: extremities normal, atraumatic, no cyanosis or edema Pulses: 2+ and symmetric Skin: Skin color, texture, turgor normal. No rashes or lesions Lymph nodes: Cervical, supraclavicular, and axillary nodes normal. Neurologic: No focal deficits  Laboratory Data: Results for orders placed or performed during the hospital encounter of 07/03/15 (from the past 48 hour(s))  Culture, blood (routine x 2)     Status: None (Preliminary result)   Collection Time: 07/03/15 12:32 PM  Result Value Ref Range   Specimen Description BLOOD RIGHT ANTECUBITAL    Special Requests BOTTLES DRAWN AEROBIC AND ANAEROBIC 5CC    Culture PENDING    Report Status PENDING   CBC WITH DIFFERENTIAL     Status: Abnormal   Collection Time: 07/03/15 12:36 PM  Result Value Ref Range   WBC 7.7 4.0 - 10.5 K/uL   RBC 3.16 (L) 3.87 - 5.11 MIL/uL   Hemoglobin 10.3 (L) 12.0 - 15.0 g/dL   HCT 30.4 (L) 36.0 - 46.0 %   MCV 96.2 78.0 - 100.0 fL   MCH 32.6 26.0 - 34.0 pg   MCHC 33.9 30.0 - 36.0 g/dL   RDW 12.8 11.5 - 15.5 %   Platelets 186 150 - 400 K/uL   Neutrophils Relative % 76 %   Neutro Abs 5.8 1.7 - 7.7 K/uL   Lymphocytes Relative 11 %   Lymphs Abs 0.9 0.7 - 4.0 K/uL   Monocytes Relative 13 %   Monocytes Absolute  1.0 0.1 - 1.0 K/uL   Eosinophils Relative 0 %   Eosinophils Absolute 0.0 0.0 - 0.7 K/uL   Basophils Relative 0 %   Basophils Absolute 0.0 0.0 - 0.1 K/uL  Protime-INR     Status: Abnormal  Collection Time: 07/03/15 12:36 PM  Result Value Ref Range   Prothrombin Time 16.8 (H) 11.6 - 15.2 seconds   INR 1.41 0.00 - 1.49  Troponin I     Status: Abnormal   Collection Time: 07/03/15 12:36 PM  Result Value Ref Range   Troponin I 1.09 (HH) <0.031 ng/mL    Comment:        POSSIBLE MYOCARDIAL ISCHEMIA. SERIAL TESTING RECOMMENDED. CRITICAL RESULT CALLED TO, READ BACK BY AND VERIFIED WITH: WORKMAN H AT 1328 ON 2.10.17 BY MENDOZA B   Brain natriuretic peptide     Status: Abnormal   Collection Time: 07/03/15 12:36 PM  Result Value Ref Range   B Natriuretic Peptide 4223.3 (H) 0.0 - 100.0 pg/mL  Comprehensive metabolic panel     Status: Abnormal   Collection Time: 07/03/15 12:36 PM  Result Value Ref Range   Sodium 135 135 - 145 mmol/L    Comment: REPEATED TO VERIFY   Potassium 4.6 3.5 - 5.1 mmol/L    Comment: REPEATED TO VERIFY   Chloride 96 (L) 101 - 111 mmol/L    Comment: REPEATED TO VERIFY   CO2 18 (L) 22 - 32 mmol/L    Comment: REPEATED TO VERIFY   Glucose, Bld 93 65 - 99 mg/dL   BUN 93 (H) 6 - 20 mg/dL    Comment: RESULTS CONFIRMED BY MANUAL DILUTION   Creatinine, Ser 3.64 (H) 0.44 - 1.00 mg/dL   Calcium 8.0 (L) 8.9 - 10.3 mg/dL    Comment: REPEATED TO VERIFY   Total Protein 6.6 6.5 - 8.1 g/dL   Albumin 3.9 3.5 - 5.0 g/dL   AST 1408 (H) 15 - 41 U/L   ALT 679 (H) 14 - 54 U/L   Alkaline Phosphatase 132 (H) 38 - 126 U/L   Total Bilirubin 1.2 0.3 - 1.2 mg/dL   GFR calc non Af Amer 11 (L) >60 mL/min   GFR calc Af Amer 13 (L) >60 mL/min    Comment: (NOTE) The eGFR has been calculated using the CKD EPI equation. This calculation has not been validated in all clinical situations. eGFR's persistently <60 mL/min signify possible Chronic Kidney Disease.    Anion gap 21 (H) 5 - 15     Comment: REPEATED TO VERIFY  I-Stat CG4 Lactic Acid, ED     Status: Abnormal   Collection Time: 07/03/15 12:51 PM  Result Value Ref Range   Lactic Acid, Venous 3.13 (HH) 0.5 - 2.0 mmol/L   Comment NOTIFIED PHYSICIAN   Troponin I     Status: Abnormal   Collection Time: 07/03/15  4:03 PM  Result Value Ref Range   Troponin I 0.85 (HH) <0.031 ng/mL    Comment:        POSSIBLE MYOCARDIAL ISCHEMIA. SERIAL TESTING RECOMMENDED. CRITICAL VALUE NOTED.  VALUE IS CONSISTENT WITH PREVIOUSLY REPORTED AND CALLED VALUE.     Radiology Reports: Dg Chest 2 View  07/03/2015  CLINICAL DATA:  Cough, congestion, shortness of breath, and weakness for 3 weeks. EXAM: CHEST  2 VIEW COMPARISON:  05/30/2006 FINDINGS: Mild enlargement of the cardiopericardial silhouette, with mild cephalization of blood flow but no overt edema. No Kerley B-lines observed. Prior CABG.  Atherosclerotic calcification of the aortic arch. Thoracic spondylosis.  No pleural effusion. IMPRESSION: 1. Stable mild enlargement of the cardiopericardial silhouette, with pulmonary venous hypertension but without overt edema. 2. Prior CABG. 3. Atherosclerotic aortic arch. Electronically Signed   By: Van Clines M.D.   On: 07/03/2015 12:06  My interpretation of Electrocardiogram: Poor quality EKG showed atrial fibrillation  Problem List  Active Problems:   CAD in native artery - CABG x4 (LIMA-LAD, SVG-distal RCA, SVG-diagonal, SVG-Ramus   Hypertension, essential   Chronic renal insufficiency, stage III (moderate)   Acute on chronic renal failure (HCC)   Dyspnea   Transaminitis   Atrial fibrillation (HCC)   Elevated troponin   Unintentional weight loss   Lactic acidosis   Assessment: This is a 80 year old Caucasian female with past medical history as stated earlier, who presents with dyspnea or cough. She reports history of fever. Patient is found to have transaminitis. She has elevated troponin. New onset atrial  fibrillation.  Plan: #1 Cough with dyspnea: Chest x-ray does not show any infiltrates. No evidence for pulmonary edema as well as. She does have some wheezing bilaterally. She'll be given steroids. It is reasonable to continue with broad-spectrum antibiotics for now. Influenza PCR will be ordered. Droplet precautions for now.  #2 acute on chronic kidney disease, stage III: Etiology is unclear for acute worsening in her renal function. Nephrology has been consulted. Ultrasound of the kidneys will be ordered. Monitor urine output closely. UA has been ordered.  #3 Transaminitis: Etiology is unclear. Abdomen is benign. Ultrasound of the hepatobiliary system will be obtained. Hepatitis panel will be ordered. Repeat LFTs tomorrow. Could be suggestive of passive congestion from cardiac etiology.  #4 elevated troponin, possible non-ST elevation MI: Troponins will be trended. Patient does not report any chest pains. She has been seen by cardiology and placed on IV heparin. Echocardiogram has been ordered.  #5 New onset atrial fibrillation: This is a new diagnosis for her. Chads2vasc score is 5. Continue beta blocker. Cardiology is following. Heart rate is reasonably well controlled.  #6 history of coronary artery disease: Stable. Continue with her home medications.   DVT Prophylaxis: IV heparin Code Status: Full code Family Communication: Discussed with the patient and her daughters  Disposition Plan: Admit to step down   Further management decisions will depend on results of further testing and patient's response to treatment.   [facility]  [facility] Pager [phone]  If 7PM-7AM, please contact night-coverage [email or web address] Password [facility]  07/03/2015, 6:36 PM

## 2015-07-03 NOTE — Consult Note (Signed)
Cardiology Consultation Note    Patient ID: [name], MRN: [id number], DOB/AGE: [birth year] 78 y.o. Admit date: 07/03/2015   Date of Consult: 07/03/2015 Primary Physician: Dwan Bolt, MD Primary Cardiologist: Ellyn Hack / Peripheral Vascular Doctor: Gwenlyn Found  Chief Complaint: fever, cough, >30lb weight loss  Reason for Consultation: atrial fibrillation, elevated troponin Requesting MD: Dr. Johnney Killian  HPI: [name] is a 80 y/o F with history of CAD s/p CABG 2007 (LIMA-LAD, SVG-dRCA, SVG-intermediate, SVG-diagonal, neg nuc 2014), PVD s/p right femoropopliteal bypass grafting, renal artery stenosis s/p L renal stent 11/2013 (residual RRAS being followed for now), reported mild-mod carotid artery disease, CKD stage III, chronic-appearing anemia, former tobacco abuse whom we are asked to see for atrial fibrillation and elevated troponin.  [name] reports being diagnosed with a URI in December 2016 and was treated with abx at that time. However, she says she just never got better. Her cough has gotten progressively worse and she has lost over 30 lbs unintentionally since November. She went to urgent care on Monday 06/29/15 for periodic fever and cough and was treated with steroid injection and Ceftin. Since that time she's only continued to feel worse. She reports fever up to 102, including yesterday. She has developed progressive SOB which occurs at rest and with recumbency. She has not had any chest pain, LEE, palpitations, awareness of elevated HR, syncope, bleeding, change in UOP or other change in medications. Workup in the ER reveals diffuse wheezing and hypoxia with O2 sat 88%, newly recognized atrial fib with fairly controlled rates (80s-100s)  Labwork notable for acute kidney injury on CKD (BUN 93/Cr 3.64 - previously 2 range), marked transaminitis (AST/ALT 1408/679) with elevated alk phos of 132, lactic acidosis 3.13, Hgb 10.3 (appears stable compared to 2015 but no interim  values), BNP 4223 and troponin 1.09. Blood cultures drawn and pending. Temp 98 and BP moderately elevated (130s-140s/90s-120s). CXR shows stable mild enlargement of the CP silhouette, with pulm venous HTN but no overt edema. Internal medicine will be admitting. She noted improvement in breathing after neb tx but is still wheezing significantly.  Past Medical History  Diagnosis Date  . CAD in native artery 2007    a. S/p CABG 2007 (LIMA-LAD, SVG-dRCA, SVG-intermediate, SVG-diagonal). b. Nuclear stress test 08/2012: normal, low risk.  . S/P CABG x 4 2007    LIMA-LAD, SVG-Distal RCA, SVG-Ramus, SVG-Diagonal; no echocardiogram done  . PAD (peripheral artery disease) ([place]) 1997    Mild-moderate carotid disease; status post right SFA occlusion with Fem-Below Knee Pop bypass (Dr. Kellie Simmering); LEA Dopplers 07/2014: RIGHT - ABI 0.84, CIA/EIA 50-69%, CFA/PFA patent Fem-Pop bypass patent w/ 70-99% anastomotic stenosis, patent Pop A with 3 V runoff; LABI 0.85- L CIA/EIA patent, LPFA 70-99%, dLSFA 50-69%, patent LPopA & 3 V runoff.  . Renovascular hypertension 12/17/2013    a. s/p L RA Stent 11/2013. b. Renal duplex 02/2015: >60% right proximal renal artery stenosis, normal left renal artery s/p stent, f/u 1 yr recommended.  . CKD (chronic kidney disease), stage III   . Essential hypertension   . Hyperlipidemia LDL goal <70   . Carotid artery disease ([place])     a. Mild-mod carotid disease per Dr.Berry's note.  . Anemia       Surgical History:  Past Surgical History  Procedure Laterality Date  . Cardiac catheterization  December 2007    LAD-90% mid, D19 percent ostial. Circumflex-OM1 90% ostial, 80% mid. RCA 9% proximal, 100% mid  . Femoropopliteal bypass Right 1997  Dr. Lawson: SFA-below knee Pop  . Coronary artery bypass graft  December 2007    LIMA-LAD, SVG-distal RCA, SVG-D1, SVG-OM1/Ramus  . Nm myoview ltd  April 2014    EF 63%, mild fixed anteroapical defect thought to be breast attenuation  .  Appendectomy    . Tonsillectomy    . Cataract surgery    . Rectovaginal fistula repair    . [place] b[facility]  1981  . Renal artery stent Left 12/16/13    using CO2  . Lower extremity venous doppler  06/13/2011    No evidence of thrombus or thrombophlebitis, no venous insuffiency noted.  . Cardiovascular stress test  08/28/2012    Normal stress nuclear study with likely breast attenuation, low risk stress test.  . Renal angiogram N/A 12/16/2013    Procedure: RENAL ANGIOGRAM;  Surgeon: Jonathan J Berry, MD;  Location: [facility] CATH [facility];  Service: Cardiovascular;  Laterality: N/A;  . Percutaneous stent intervention Left 12/16/2013    Procedure: PERCUTANEOUS STENT INTERVENTION - LEFT RENAL ARTERY;  Surgeon: Jonathan J Berry, MD;  Location: [facility] CATH [facility];  Service: Cardiovascular;  Laterality: Left;  renal     Home Meds: Prior to Admission medications   Medication Sig Start Date End Date Taking? Authorizing Provider  acetaminophen (TYLENOL) 325 MG tablet Take 650 mg by mouth every 6 (six) hours as needed for moderate pain.   Yes Historical Provider, MD  Alirocumab 75 MG/ML SOPN Inject 75 mg into the skin every 14 (fourteen) days. Will start 09/11/14   Yes Historical Provider, MD  aspirin 81 MG tablet Take 1 tablet (81 mg total) by mouth daily. 12/17/13  Yes Laura R Ingold, NP  betamethasone dipropionate (DIPROLENE) 0.05 % cream Apply 1 application topically 2 (two) times daily as needed. ECZEMA   Yes Historical Provider, MD  cefUROXime (CEFTIN) 500 MG tablet Take 500 mg by mouth 2 (two) times daily. 06/29/15  Yes Historical Provider, MD  chlorpheniramine-HYDROcodone (TUSSIONEX) 10-8 MG/5ML SUER Take 5 mLs by mouth every 12 (twelve) hours as needed for cough.  06/29/15  Yes Historical Provider, MD  clopidogrel (PLAVIX) 75 MG tablet TAKE 1 TABLET (75 MG TOTAL) BY MOUTH DAILY WITH BREAKFAST. 11/10/14  Yes David W Harding, MD  colchicine 0.6 MG tablet Take 0.6 mg by mouth 2 (two) times daily as needed. GOUT FLARE UP.   Yes  Historical Provider, MD  febuxostat (ULORIC) 40 MG tablet Take 40 mg by mouth daily.   Yes Historical Provider, MD  furosemide (LASIX) 40 MG tablet Take 40 mg by mouth 2 (two) times daily.   Yes Historical Provider, MD  hydrALAZINE (APRESOLINE) 25 MG tablet Take 25-50 mg by mouth 2 (two) times daily. 1 tablet in the am and 2 tablets in the pm   Yes Historical Provider, MD  LORazepam (ATIVAN) 0.5 MG tablet Take 0.5 mg by mouth 4 (four) times daily as needed for anxiety.   Yes Historical Provider, MD  metoprolol (LOPRESSOR) 50 MG tablet Take 1 tablet (50 mg total) by mouth 2 (two) times daily. 05/31/13  Yes David W Harding, MD  Multiple Vitamin (MULTIVITAMIN) tablet Take 1 tablet by mouth daily.   Yes Historical Provider, MD  Multiple Vitamins-Minerals (ICAPS) CAPS Take 1 capsule by mouth 2 (two) times daily.   Yes Historical Provider, MD  pantoprazole (PROTONIX) 40 MG tablet Take 1 tablet (40 mg total) by mouth daily. 12/17/13  Yes Laura R Ingold, NP    Inpatient Medications:  . aspirin  324 mg Oral   Once  . ipratropium-albuterol  3 mL Nebulization Q4H   . sodium chloride 125 mL/hr at 07/03/15 1243  . [START ON 07/05/2015] vancomycin      Allergies:  Allergies  Allergen Reactions  . Angiotensin Receptor Blockers   . Latex Swelling  . Statins Other (See Comments)    Diffuse cramping; has tried Lipitor, Crestor, Pravachol and simvastatin  . Benicar [Olmesartan] Itching and Rash    Social History   Social History  . Marital Status: Married    Spouse Name: N/A  . Number of Children: N/A  . Years of Education: N/A   Occupational History  . Not on file.   Social History Main Topics  . Smoking status: Former Smoker    Types: Cigarettes    Quit date: 05/31/1993  . Smokeless tobacco: Never Used  . Alcohol Use: 4.2 oz/week    7 Glasses of wine per week  . Drug Use: No  . Sexual Activity: Not on file   Other Topics Concern  . Not on file   Social History Narrative   She is a  married mother of 4 with one child that died at a young age. She has 11 grandchildren and 11 great grandchildren. She is not excessively active. She does get around the house. No routine exercise   She quit smoking in 1993, and has a social alcohol beverage on occasion.   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     Family History  Problem Relation Age of Onset  . Breast cancer Mother   . Heart attack Father   . Cancer - Cervical Brother      Review of Systems: All other systems reviewed and are otherwise negative except as noted above.  Labs:  Recent Labs  07/03/15 1236  TROPONINI 1.09*   Lab Results  Component Value Date   WBC 7.7 07/03/2015   HGB 10.3* 07/03/2015   HCT 30.4* 07/03/2015   MCV 96.2 07/03/2015   PLT 186 07/03/2015    Recent Labs Lab 07/03/15 1236  NA 135  K 4.6  CL 96*  CO2 18*  BUN 93*  CREATININE 3.64*  CALCIUM 8.0*  PROT 6.6  BILITOT 1.2  ALKPHOS 132*  ALT 679*  AST 1408*  GLUCOSE 93   No results found for: CHOL, HDL, LDLCALC, TRIG No results found for: DDIMER  Radiology/Studies:  Dg Chest 2 View  07/03/2015  CLINICAL DATA:  Cough, congestion, shortness of breath, and weakness for 3 weeks. EXAM: CHEST  2 VIEW COMPARISON:  05/30/2006 FINDINGS: Mild enlargement of the cardiopericardial silhouette, with mild cephalization of blood flow but no overt edema. No Kerley B-lines observed. Prior CABG.  Atherosclerotic calcification of the aortic arch. Thoracic spondylosis.  No pleural effusion. IMPRESSION: 1. Stable mild enlargement of the cardiopericardial silhouette, with pulmonary venous hypertension but without overt edema. 2. Prior CABG. 3. Atherosclerotic aortic arch. Electronically Signed   By: Walter  Liebkemann M.D.   On: 07/03/2015 12:06    Wt Readings from Last 3 Encounters:  07/03/15 140 lb (63.504 kg)  03/10/15 173 lb 14.4 oz (78.881 kg)  09/09/14 177 lb (80.287 kg)   EKG: atrial fib 101bpm, baseline  wander present, nonspecific ST-T changes, no acute findings  Physical Exam: Blood pressure 143/127, pulse 60, temperature 98 F (36.7 C), temperature source Oral, resp. rate 17, height 5' 3" (1.6 m), weight 140 lb (63.504 kg), SpO2 96 %. Body mass index is 24.81 kg/(m^2). General: Well   developed WF in no acute distress at rest but does develop increased RR and audible wheezing even with brief movements in bed. Head: Normocephalic, atraumatic, sclera non-icteric, no xanthomas, nares are without discharge.  Neck: Negative for carotid bruits. JVD not elevated. Lungs: Diffuse rhonchi and wheezing. Breathing is unlabored at rest but after simple action of sitting forward in bed she develops increased RR. Heart: Irregularly irregular rhythm, controlled rate, with S1 S2. No murmurs, rubs, or gallops appreciated. Abdomen: Soft, non-tender, non-distended with normoactive bowel sounds. No hepatomegaly. No rebound/guarding. No obvious abdominal masses. Msk:  Strength and tone appear normal for age. Extremities: No clubbing or cyanosis. No edema.  Distal pedal pulses are 2+ and equal bilaterally. Neuro: Alert and oriented X 3. No facial asymmetry. No focal deficit. Moves all extremities spontaneously. Psych:  Responds to questions appropriately with a normal affect.    Assessment and Plan  79 y/o F with history of CAD s/p CABG 2007 (LIMA-LAD, SVG-dRCA, SVG-intermediate, SVG-diagonal, neg nuc 2014), PVD s/p right femoropopliteal bypass grafting, renal artery stenosis s/p L renal stent 11/2013 (residual RAS being followed for now), reported mild-mod carotid artery disease, CKD stage III, chronic-appearing anemia, former tobacco abuse is admitted with fever, cough, acute kidney injury on CKD (BUN 93/Cr 3.64 - previously 2 range), marked transaminitis (AST/ALT 1408/679), lactic acidosis, elevated BNP and initial troponin 1.09.  1. Dyspnea/fever/wheezing with lactic acidosis - suspicious for PNA. She also relays a  history of 30lb unintentional weight loss over the last several months, corroborated by weights in Epic which is certainly worrisome. Further management per IM.   2. Acute kidney injury on CKD stage III - previous Cr baseline was around 2; with BUN/Cr 93/3.64. IM plans to consult renal.  3. Acute transaminitis - etiology not totally clear. Further workup per IM. Will likely need acute hep panel. INR is OK. LFTs seem out of proportion to what would be expected for passive congestion.  4. Elevated troponin & BNP - elevated troponin is suspicious for NSTEMI but it doesn't seem as though this is the primary driver of her admission. No recent CP. No indication for acute invasive cardiology evaluation at present time with clinical presentation and renal failure. Start heparin per pharmacy. Cycle troponins. Recommend to continue ASA and Plavix. She has h/o statin intolerance - would not add due to elevated LFTs. Will discuss case further with MD with regard to elevated BNP. Check echocardiogram. Neck veins are flat and she has no peripheral edema on exam. CXR not really suggestive of edema. BUN/Cr appear pre-renal. Normal EF in 2014.  5. Newly recognized atrial fib - rates already generally controlled. Unclear duration - possibly precipitated by acute pulmonary process. She was unaware of this rhythm. Would continue BB as tolerated. If wheezing persists can consider changing to more selective BB such as bisoprolol. Add heparin per pharmacy for now - not a candidate for NOAC acutely given her liver/kidney dysfunction. We will also need to see where troponins go before committing to oral anticoagulation. TSH pending. CHADSVASC 5.  Signed, Dayna N Dunn PA-C 07/03/2015, 3:07 PM Pager: [phone]   

## 2015-07-03 NOTE — ED Provider Notes (Signed)
CSN: [id number]     Arrival date & time 07/03/15  1036 History   First MD Initiated Contact with Patient 07/03/15 1112     Chief Complaint  Patient presents with  . Shortness of Breath  . URI     (Consider location/radiation/quality/duration/timing/severity/associated sxs/prior Treatment) HPI Patient has had URI symptoms that started back in December. She reports that since that time she has had some rebounds with a lot of coughing. She reports that she has had cough that has had some mucus production now over the past several days. The mucous has been green in appearance. She got seen by her family doctor on Monday and they gave her Rocephin, a steroid and an antibiotic to take at home. She reports she has not gotten improvement. She became more short of breath yesterday. No chest pain except with deep inspiration which is diffuse to her chest. She reports some muscles in her upper abdomen cough somewhat from all the harsh coughing she has done. She reports she had a fever this week. Her temperature was up to 102 most days this week. No lower extremity swelling or calf pain. Past Medical History  Diagnosis Date  . CAD in native artery 2007    a. S/p CABG 2007 (LIMA-LAD, SVG-dRCA, SVG-intermediate, SVG-diagonal). b. Nuclear stress test 08/2012: normal, low risk.  . S/P CABG x 4 2007    LIMA-LAD, SVG-Distal RCA, SVG-Ramus, SVG-Diagonal; no echocardiogram done  . PAD (peripheral artery disease) (HCC) 1997    Mild-moderate carotid disease; status post right SFA occlusion with Fem-Below Knee Pop bypass (Dr. Hart Rochester); LEA Dopplers 07/2014: RIGHT - ABI 0.84, CIA/EIA 50-69%, CFA/PFA patent Fem-Pop bypass patent w/ 70-99% anastomotic stenosis, patent Pop A with 3 V runoff; LABI 0.85- L CIA/EIA patent, LPFA 70-99%, dLSFA 50-69%, patent LPopA & 3 V runoff.  . Renovascular hypertension 12/17/2013    a. s/p L RA Stent 11/2013. b. Renal duplex 02/2015: >60% right proximal renal artery stenosis, normal left renal  artery s/p stent, f/u 1 yr recommended.  . CKD (chronic kidney disease), stage III   . Essential hypertension   . Hyperlipidemia LDL goal <70   . Carotid artery disease (HCC)     a. Mild-mod carotid disease per Dr.Berry's note.  . Anemia    Past Surgical History  Procedure Laterality Date  . Cardiac catheterization  December 2007    LAD-90% mid, D19 percent ostial. Circumflex-OM1 90% ostial, 80% mid. RCA 9% proximal, 100% mid  . Femoropopliteal bypass Right 1997    Dr. Hart Rochester: SFA-below knee Pop  . Coronary artery bypass graft  December 2007    LIMA-LAD, SVG-distal RCA, SVG-D1, SVG-OM1/Ramus  . Nm myoview ltd  April 2014    EF 63%, mild fixed anteroapical defect thought to be breast attenuation  . Appendectomy    . Tonsillectomy    . Cataract surgery    . Rectovaginal fistula repair    . [facility]  1981  . Renal artery stent Left 12/16/13    using CO2  . Lower extremity venous doppler  06/13/2011    No evidence of thrombus or thrombophlebitis, no venous insuffiency noted.  . Cardiovascular stress test  08/28/2012    Normal stress nuclear study with likely breast attenuation, low risk stress test.  . Renal angiogram N/A 12/16/2013    Procedure: RENAL ANGIOGRAM;  Surgeon: Runell Gess, MD;  Location[facility] CATH LAB;  Service: Cardiovascular;  Laterality: N/A;  . Percutaneous stent intervention Left 12/16/2013    Procedure:  PERCUTANEOUS STENT INTERVENTION - LEFT RENAL ARTERY;  Surgeon: Runell Gess, MD;  Location: [facility] CATH LAB;  Service: Cardiovascular;  Laterality: Left;  renal   Family History  Problem Relation Age of Onset  . Breast cancer Mother   . Heart attack Father   . Cancer - Cervical Brother    Social History  Substance Use Topics  . Smoking status: Former Smoker    Types: Cigarettes    Quit date: 05/31/1993  . Smokeless tobacco: Never Used  . Alcohol Use: 4.2 oz/week    7 Glasses of wine per week   OB History    No data available     Review of Systems  10  Systems reviewed and are negative for acute change except as noted in the HPI.   Allergies  Angiotensin receptor blockers; Latex; Statins; and Benicar  Home Medications   Prior to Admission medications   Medication Sig Start Date End Date Taking? Authorizing Provider  acetaminophen (TYLENOL) 325 MG tablet Take 650 mg by mouth every 6 (six) hours as needed for moderate pain.   Yes Historical Provider, MD  Alirocumab 75 MG/ML SOPN Inject 75 mg into the skin every 14 (fourteen) days. Will start 09/11/14   Yes Historical Provider, MD  aspirin 81 MG tablet Take 1 tablet (81 mg total) by mouth daily. 12/17/13  Yes Leone Brand, NP  betamethasone dipropionate (DIPROLENE) 0.05 % cream Apply 1 application topically 2 (two) times daily as needed. ECZEMA   Yes Historical Provider, MD  cefUROXime (CEFTIN) 500 MG tablet Take 500 mg by mouth 2 (two) times daily. 06/29/15  Yes Historical Provider, MD  chlorpheniramine-HYDROcodone (TUSSIONEX) 10-8 MG/5ML SUER Take 5 mLs by mouth every 12 (twelve) hours as needed for cough.  06/29/15  Yes Historical Provider, MD  clopidogrel (PLAVIX) 75 MG tablet TAKE 1 TABLET (75 MG TOTAL) BY MOUTH DAILY WITH BREAKFAST. 11/10/14  Yes Marykay Lex, MD  colchicine 0.6 MG tablet Take 0.6 mg by mouth 2 (two) times daily as needed. GOUT FLARE UP.   Yes Historical Provider, MD  febuxostat (ULORIC) 40 MG tablet Take 40 mg by mouth daily.   Yes Historical Provider, MD  furosemide (LASIX) 40 MG tablet Take 40 mg by mouth 2 (two) times daily.   Yes Historical Provider, MD  hydrALAZINE (APRESOLINE) 25 MG tablet Take 25-50 mg by mouth 2 (two) times daily. 1 tablet in the am and 2 tablets in the pm   Yes Historical Provider, MD  LORazepam (ATIVAN) 0.5 MG tablet Take 0.5 mg by mouth 4 (four) times daily as needed for anxiety.   Yes Historical Provider, MD  metoprolol (LOPRESSOR) 50 MG tablet Take 1 tablet (50 mg total) by mouth 2 (two) times daily. 05/31/13  Yes Marykay Lex, MD  Multiple  Vitamin (MULTIVITAMIN) tablet Take 1 tablet by mouth daily.   Yes Historical Provider, MD  Multiple Vitamins-Minerals (ICAPS) CAPS Take 1 capsule by mouth 2 (two) times daily.   Yes Historical Provider, MD  pantoprazole (PROTONIX) 40 MG tablet Take 1 tablet (40 mg total) by mouth daily. 12/17/13  Yes Leone Brand, NP   BP 128/100 mmHg  Pulse 76  Temp(Src) 98 F (36.7 C) (Oral)  Resp 19  Ht 5\' 3"  (1.6 m)  Wt 140 lb (63.504 kg)  BMI 24.81 kg/m2  SpO2 97% Physical Exam  Constitutional: She is oriented to person, place, and time. She appears well-developed and well-nourished.  Patient is alert and nontoxic. She has  mild increased work of breathing.  HENT:  Head: Normocephalic and atraumatic.  Nose: Nose normal.  Mouth/Throat: Oropharynx is clear and moist.  Eyes: EOM are normal. Pupils are equal, round, and reactive to light.  Neck: Neck supple.  Cardiovascular: Intact distal pulses.   Irregularly irregular borderline tachycardia. No gross rub murmur gallop.  Pulmonary/Chest: She has wheezes. She has rales.  Mild to moderate increased work of breathing. Diffuse rales or rhonchi. Moist cough with deep inspiration.  Abdominal: Soft. Bowel sounds are normal. She exhibits no distension. There is no tenderness.  Musculoskeletal: Normal range of motion. She exhibits no edema or tenderness.  Neurological: She is alert and oriented to person, place, and time. She exhibits normal muscle tone. Coordination normal.  Skin: Skin is warm and dry.  Psychiatric: She has a normal mood and affect.    ED Course  Procedures (including critical care time) CRITICAL CARE Performed by: Arby Barrette   Total critical care time: 45 minutes  Critical care time was exclusive of separately billable procedures and treating other patients.  Critical care was necessary to treat or prevent imminent or life-threatening deterioration.  Critical care was time spent personally by me on the following  activities: development of treatment plan with patient and/or surrogate as well as nursing, discussions with consultants, evaluation of patient's response to treatment, examination of patient, obtaining history from patient or surrogate, ordering and performing treatments and interventions, ordering and review of laboratory studies, ordering and review of radiographic studies, pulse oximetry and re-evaluation of patient's condition. Labs Review Labs Reviewed  CBC WITH DIFFERENTIAL/PLATELET - Abnormal; Notable for the following:    RBC 3.16 (*)    Hemoglobin 10.3 (*)    HCT 30.4 (*)    All other components within normal limits  PROTIME-INR - Abnormal; Notable for the following:    Prothrombin Time 16.8 (*)    All other components within normal limits  TROPONIN I - Abnormal; Notable for the following:    Troponin I 1.09 (*)    All other components within normal limits  BRAIN NATRIURETIC PEPTIDE - Abnormal; Notable for the following:    B Natriuretic Peptide 4223.3 (*)    All other components within normal limits  COMPREHENSIVE METABOLIC PANEL - Abnormal; Notable for the following:    Chloride 96 (*)    CO2 18 (*)    BUN 93 (*)    Creatinine, Ser 3.64 (*)    Calcium 8.0 (*)    AST 1408 (*)    ALT 679 (*)    Alkaline Phosphatase 132 (*)    GFR calc non Af Amer 11 (*)    GFR calc Af Amer 13 (*)    Anion gap 21 (*)    All other components within normal limits  I-STAT CG4 LACTIC ACID, ED - Abnormal; Notable for the following:    Lactic Acid, Venous 3.13 (*)    All other components within normal limits  CULTURE, BLOOD (ROUTINE X 2)  CULTURE, BLOOD (ROUTINE X 2)  TROPONIN I    Imaging Review Dg Chest 2 View  07/03/2015  CLINICAL DATA:  Cough, congestion, shortness of breath, and weakness for 3 weeks. EXAM: CHEST  2 VIEW COMPARISON:  05/30/2006 FINDINGS: Mild enlargement of the cardiopericardial silhouette, with mild cephalization of blood flow but no overt edema. No Kerley B-lines  observed. Prior CABG.  Atherosclerotic calcification of the aortic arch. Thoracic spondylosis.  No pleural effusion. IMPRESSION: 1. Stable mild enlargement of the cardiopericardial silhouette, with  pulmonary venous hypertension but without overt edema. 2. Prior CABG. 3. Atherosclerotic aortic arch. Electronically Signed   By: Gaylyn Rong M.D.   On: 07/03/2015 12:06   I have personally reviewed and evaluated these images and lab results as part of my medical decision-making.   EKG Interpretation   Date/Time:  Friday July 03 2015 11:08:08 EST Ventricular Rate:  101 PR Interval:    QRS Duration: 104 QT Interval:  372 QTC Calculation: 482 R Axis:   -30 Text Interpretation:  Atrial fibrillation Ventricular tachycardia,  unsustained Left axis deviation Low voltage, precordial leads Nonspecific  repol abnormality, diffuse leads ST elevation, consider inferior injury  Baseline wander in lead(s) V1 agree. no STEMI Confirmed by Donnald Garre, MD,  Lebron Conners [phone]) on 07/03/2015 2:37:00 PM     Recheck: 14:20 patient expressed improvement with DuoNeb. She reports breathing is more comfortable. To auscultation continues to have extensive wheeze and rail. Blood pressures are rechecked stable in the 140s over 90s. Heart rate is 90s A. Fibrillation. No chest pain.  Consult:(14:45) Cardiology will evaluate the patient as consult with hospitalist admission. Consult: (14:50) hospitalist consult for admission. Will see patient in ED. MDM   Final diagnoses:  Community acquired pneumonia  Sepsis, due to unspecified organism [facility])  Persistent atrial fibrillation [facility])   Patient presents with symptoms consistent with pneumonia with failed outpatient treatment. She has had fever and productive cough of green sputum. Lactic acid is elevated with elevated heart rate in new-onset A. fib. A fluid resuscitation and antibiotics are initiated. Is also concern for congestive heart failure concomitantly. Patient has  very high BNP with rails. Cardiology has been consulted for new atrial fibrillation with mild elevation in troponin. Aspirin has been administrated. Patient is also being given DuoNeb's to which she is experiencing positive response. Patient remains with clear mental status and mild to moderate increased work of breathing without difficulty speaking or appearance of fatigue.    Arby Barrette, MD 07/03/15 [phone]

## 2015-07-04 ENCOUNTER — Other Ambulatory Visit (HOSPITAL_COMMUNITY): Payer: Medicare Other

## 2015-07-04 ENCOUNTER — Inpatient Hospital Stay (HOSPITAL_COMMUNITY): Payer: Medicare Other

## 2015-07-04 DIAGNOSIS — J189 Pneumonia, unspecified organism: Secondary | ICD-10-CM

## 2015-07-04 DIAGNOSIS — A419 Sepsis, unspecified organism: Secondary | ICD-10-CM | POA: Diagnosis present

## 2015-07-04 LAB — URINALYSIS, ROUTINE W REFLEX MICROSCOPIC
BILIRUBIN URINE: NEGATIVE
GLUCOSE, UA: NEGATIVE mg/dL
Hgb urine dipstick: NEGATIVE
KETONES UR: NEGATIVE mg/dL
Leukocytes, UA: NEGATIVE
NITRITE: NEGATIVE
PH: 5 (ref 5.0–8.0)
Protein, ur: 100 mg/dL — AB
Specific Gravity, Urine: 1.017 (ref 1.005–1.030)

## 2015-07-04 LAB — COMPREHENSIVE METABOLIC PANEL WITH GFR
ALT: 538 U/L — ABNORMAL HIGH (ref 14–54)
AST: 647 U/L — ABNORMAL HIGH (ref 15–41)
Albumin: 3.6 g/dL (ref 3.5–5.0)
Alkaline Phosphatase: 116 U/L (ref 38–126)
Anion gap: 20 — ABNORMAL HIGH (ref 5–15)
BUN: 111 mg/dL — ABNORMAL HIGH (ref 6–20)
CO2: 17 mmol/L — ABNORMAL LOW (ref 22–32)
Calcium: 7.5 mg/dL — ABNORMAL LOW (ref 8.9–10.3)
Chloride: 94 mmol/L — ABNORMAL LOW (ref 101–111)
Creatinine, Ser: 3.37 mg/dL — ABNORMAL HIGH (ref 0.44–1.00)
GFR calc Af Amer: 14 mL/min — ABNORMAL LOW
GFR calc non Af Amer: 12 mL/min — ABNORMAL LOW
Glucose, Bld: 124 mg/dL — ABNORMAL HIGH (ref 65–99)
Potassium: 4.3 mmol/L (ref 3.5–5.1)
Sodium: 131 mmol/L — ABNORMAL LOW (ref 135–145)
Total Bilirubin: 0.9 mg/dL (ref 0.3–1.2)
Total Protein: 6.4 g/dL — ABNORMAL LOW (ref 6.5–8.1)

## 2015-07-04 LAB — TSH: TSH: 1.428 u[IU]/mL (ref 0.350–4.500)

## 2015-07-04 LAB — CBC
HEMATOCRIT: 36.6 % (ref 36.0–46.0)
HEMOGLOBIN: 12 g/dL (ref 12.0–15.0)
MCH: 31.8 pg (ref 26.0–34.0)
MCHC: 32.8 g/dL (ref 30.0–36.0)
MCV: 97.1 fL (ref 78.0–100.0)
Platelets: 205 10*3/uL (ref 150–400)
RBC: 3.77 MIL/uL — AB (ref 3.87–5.11)
RDW: 13.2 % (ref 11.5–15.5)
WBC: 8.2 10*3/uL (ref 4.0–10.5)

## 2015-07-04 LAB — HEPARIN LEVEL (UNFRACTIONATED)
HEPARIN UNFRACTIONATED: 0.11 [IU]/mL — AB (ref 0.30–0.70)
HEPARIN UNFRACTIONATED: 0.32 [IU]/mL (ref 0.30–0.70)

## 2015-07-04 LAB — URINE MICROSCOPIC-ADD ON: RBC / HPF: NONE SEEN RBC/hpf (ref 0–5)

## 2015-07-04 LAB — PROTIME-INR
INR: 1.53 — ABNORMAL HIGH (ref 0.00–1.49)
PROTHROMBIN TIME: 17.9 s — AB (ref 11.6–15.2)

## 2015-07-04 LAB — TROPONIN I: Troponin I: 0.45 ng/mL — ABNORMAL HIGH

## 2015-07-04 LAB — INFLUENZA PANEL BY PCR (TYPE A & B)
H1N1 flu by pcr: NOT DETECTED
Influenza A By PCR: NEGATIVE
Influenza B By PCR: NEGATIVE

## 2015-07-04 LAB — CK: CK TOTAL: 318 U/L — AB (ref 38–234)

## 2015-07-04 MED ORDER — HEPARIN (PORCINE) IN NACL 100-0.45 UNIT/ML-% IJ SOLN
1000.0000 [IU]/h | INTRAMUSCULAR | Status: DC
  Administered 2015-07-04: 1000 [IU]/h via INTRAVENOUS
  Filled 2015-07-04 (×2): qty 250

## 2015-07-04 MED ORDER — LORAZEPAM 1 MG PO TABS
1.0000 mg | ORAL_TABLET | Freq: Four times a day (QID) | ORAL | Status: AC | PRN
  Administered 2015-07-04 – 2015-07-06 (×3): 1 mg via ORAL
  Filled 2015-07-04 (×3): qty 1

## 2015-07-04 MED ORDER — HEPARIN (PORCINE) IN NACL 100-0.45 UNIT/ML-% IJ SOLN
950.0000 [IU]/h | INTRAMUSCULAR | Status: DC
  Administered 2015-07-04: 950 [IU]/h via INTRAVENOUS
  Filled 2015-07-04: qty 250

## 2015-07-04 MED ORDER — LORAZEPAM 2 MG/ML IJ SOLN: 1.0000 mg | Freq: Four times a day (QID) | INTRAMUSCULAR | Status: AC | PRN

## 2015-07-04 MED ORDER — THIAMINE HCL 100 MG/ML IJ SOLN
100.0000 mg | Freq: Every day | INTRAMUSCULAR | Status: DC
  Administered 2015-07-11: 100 mg via INTRAVENOUS
  Filled 2015-07-04: qty 2
  Filled 2015-07-04 (×2): qty 1
  Filled 2015-07-04: qty 2
  Filled 2015-07-04 (×2): qty 1

## 2015-07-04 MED ORDER — FOLIC ACID 1 MG PO TABS
1.0000 mg | ORAL_TABLET | Freq: Every day | ORAL | Status: DC
  Administered 2015-07-04 – 2015-07-19 (×15): 1 mg via ORAL
  Filled 2015-07-04 (×16): qty 1

## 2015-07-04 MED ORDER — VITAMIN B-1 100 MG PO TABS
100.0000 mg | ORAL_TABLET | Freq: Every day | ORAL | Status: DC
  Administered 2015-07-04 – 2015-07-19 (×15): 100 mg via ORAL
  Filled 2015-07-04 (×15): qty 1

## 2015-07-04 MED ORDER — HEPARIN BOLUS VIA INFUSION
1500.0000 [IU] | Freq: Once | INTRAVENOUS | Status: AC
  Administered 2015-07-04: 1500 [IU] via INTRAVENOUS
  Filled 2015-07-04: qty 1500

## 2015-07-04 MED ORDER — ADULT MULTIVITAMIN W/MINERALS CH
1.0000 | ORAL_TABLET | Freq: Every day | ORAL | Status: DC
  Administered 2015-07-04 – 2015-07-19 (×16): 1 via ORAL
  Filled 2015-07-04 (×17): qty 1

## 2015-07-04 NOTE — Progress Notes (Signed)
PT Cancellation Note  Patient Details Name: [name] MRN: [id number] DOB: [birth year]   Cancelled Treatment:    Reason Eval/Treat Not Completed: Patient not medically ready (BP elevated, Troponin elevated with echo pending, Heparin started early this am; defer eval at this time and attempt again as schedule permits and medical issues allow)   [facility] 07/04/2015, 12:40 PM

## 2015-07-04 NOTE — Progress Notes (Signed)
ANTICOAGULATION CONSULT NOTE - F/u Consult  Pharmacy Consult for heparin Indication: chest pain/ACS and atrial fibrillation  Allergies  Allergen Reactions  . Angiotensin Receptor Blockers   . Latex Swelling  . Statins Other (See Comments)    Diffuse cramping; has tried Lipitor, Crestor, Pravachol and simvastatin  . Benicar [Olmesartan] Itching and Rash    Patient Measurements: Height:  (160 cm) Weight: 140 lb (63.504 kg) IBW/kg (Calculated) : 52.4 Heparin Dosing Weight: 63 kg  Vital Signs: Temp: 97.5 F (36.4 C) (02/11 0856) Temp Source: Oral (02/11 0856) BP: 157/109 mmHg (02/11 1025) Pulse Rate: 72 (02/11 1025)  Labs:  Recent Labs  07/03/15 1236 07/03/15 1603 07/03/15 2247 07/04/15 0116 07/04/15 0425 07/04/15 0427 07/04/15 0952 07/04/15 1345  HGB 10.3*  --   --   --   --  12.0  --   --   HCT 30.4*  --   --   --   --  36.6  --   --   PLT 186  --   --   --   --  205  --   --   LABPROT 16.8*  --   --   --   --  17.9*  --   --   INR 1.41  --   --   --   --  1.53*  --   --   HEPARINUNFRC  --   --   --  0.11*  --   --   --  0.32  CREATININE 3.64*  --   --   --   --  3.37*  --   --   CKTOTAL  --   --   --   --   --   --  318*  --   TROPONINI 1.09* 0.85* 0.54*  --  0.45*  --   --   --     Estimated Creatinine Clearance: 12.5 mL/min (by C-G formula based on Cr of 3.37).   Medical History: Past Medical History  Diagnosis Date  . CAD in native artery 2007    a. S/p CABG 2007 (LIMA-LAD, SVG-dRCA, SVG-intermediate, SVG-diagonal). b. Nuclear stress test 08/2012: normal, low risk.  . S/P CABG x 4 2007    LIMA-LAD, SVG-Distal RCA, SVG-Ramus, SVG-Diagonal; no echocardiogram done  . PAD (peripheral artery disease) (HCC) 1997    Mild-moderate carotid disease; status post right SFA occlusion with Fem-Below Knee Pop bypass (Dr. Hart Rochester); LEA Dopplers 07/2014: RIGHT - ABI 0.84, CIA/EIA 50-69%, CFA/PFA patent Fem-Pop bypass patent w/ 70-99% anastomotic stenosis, patent Pop A  with 3 V runoff; LABI 0.85- L CIA/EIA patent, LPFA 70-99%, dLSFA 50-69%, patent LPopA & 3 V runoff.  . Renovascular hypertension 12/17/2013    a. s/p L RA Stent 11/2013. b. Renal duplex 02/2015: >60% right proximal renal artery stenosis, normal left renal artery s/p stent, f/u 1 yr recommended.  . CKD (chronic kidney disease), stage III   . Essential hypertension   . Hyperlipidemia LDL goal <70   . Carotid artery disease (HCC)     a. Mild-mod carotid disease per Dr.Berry's note.  . Anemia     Assessment: Patient's a 80 y.o F presented to the ED on 2/10 with c/o SOB.  Troponin was found to be elevated in the ED and with new onset afib.  To start heparin for Afib and r/o ACS.  Patient reported no recent bleeding events or surgical procedure.  Today, 07/04/2015:  0116 HL=0.11, re-bolused and rate increased to  950 units/hr  1345 HL (drawn late by lab) = 0.32, therapeutic but at lower end of range  CBC ok No bleeding/complications reported.   Goal of Therapy:  Heparin level 0.3-0.7 units/ml Monitor platelets by anticoagulation protocol: Yes   Plan:  - Increase heparin infusion slightly to 1000 units/hr - check 8 hr heparin level  Clance Boll 07/04/2015,2:47 PM

## 2015-07-04 NOTE — Progress Notes (Addendum)
    Subjective:  Denies CP; dyspnea improving   Objective:  Filed Vitals:   07/04/15 0600 07/04/15 0630 07/04/15 0836 07/04/15 0856  BP: 115/86 157/93  151/89  Pulse: 93   88  Temp: 97.9 F (36.6 C)   97.5 F (36.4 C)  TempSrc: Oral   Oral  Resp: 21 11  20   Height:      Weight:      SpO2: 100%  92% 97%    Intake/Output from previous day:  Intake/Output Summary (Last 24 hours) at 07/04/15 5300 Last data filed at 07/03/15 1607  Gross per 24 hour  Intake    350 ml  Output      0 ml  Net    350 ml    Physical Exam: Physical exam: Well-developed well-nourished in no acute distress.  Skin is warm and dry.  HEENT is normal.  Neck is supple.  Chest with mild rhonchi Cardiovascular exam is irregular Abdominal exam nontender or distended. No masses palpated. Extremities show no edema. neuro grossly intact    Lab Results: Basic Metabolic Panel:  Recent Labs  51/10/21 1236 07/04/15 0427  NA 135 131*  K 4.6 4.3  CL 96* 94*  CO2 18* 17*  GLUCOSE 93 124*  BUN 93* 111*  CREATININE 3.64* 3.37*  CALCIUM 8.0* 7.5*   CBC:  Recent Labs  07/03/15 1236 07/04/15 0427  WBC 7.7 8.2  NEUTROABS 5.8  --   HGB 10.3* 12.0  HCT 30.4* 36.6  MCV 96.2 97.1  PLT 186 205   Cardiac Enzymes:  Recent Labs  07/03/15 1603 07/03/15 2247 07/04/15 0425  TROPONINI 0.85* 0.54* 0.45*     Assessment/Plan:  1 New onset atrial fibrillation-TSH normal; await echo; continue metoprolol for rate control; continue heparin; transition to coumadin or DOAC later pending renal function; DCCV as outpt if atrial fibrillation persists. She apparently has an ETOH problem. Therefore likely not a good long term anticoagulation candidate; however, atrial fibrillation may be related to recent acute illness; as she recovers could treat with short term anticoagulation and then proceed with DCCV to see if she holds sinus and then DC anticoagulation 4 weeks later. 2 Elevated troponin-no chest pain;  possibly related to acute renal failure and atrial fibrillation; continue ASA; DC plavix; outpt nuclear study when she recovers from present illness. 3 Acute on chronic stage 3 kidney disease-nephrology following and managing diuretics. 4 ETOH abuse-pt's daughter states pt is alcoholic; may need withdrawal prophylaxis; will leave to primary care. 5 Possible pneumonia-per IM 6 Elevated LFTs-?related to ETOH; improving.    Olga Millers 07/04/2015, 9:21 AM

## 2015-07-04 NOTE — Progress Notes (Signed)
  [facility] Progress Note   Subjective: Sat in ED all night so didn't get IV lasix until just now.  Getting neb Rx now, breathing " a little better".   Filed Vitals:   07/04/15 0530 07/04/15 0600 07/04/15 0630 07/04/15 0836  BP: 127/95 115/86 157/93   Pulse:  93    Temp:  97.9 F (36.6 C)    TempSrc:  Oral    Resp: 13 21 11    Height:      Weight:      SpO2:  100%  92%    Inpatient medications: . aspirin EC  81 mg Oral Daily  . clopidogrel  75 mg Oral Daily  . levalbuterol  0.63 mg Nebulization TID  . methylPREDNISolone (SOLU-MEDROL) injection  60 mg Intravenous Q6H  . metoprolol  50 mg Oral BID  . pantoprazole  40 mg Oral Daily  . sodium chloride flush  3 mL Intravenous Q12H   . ceFEPime (MAXIPIME) IV    . furosemide 120 mg (07/04/15 0724)  . heparin 950 Units/hr (07/04/15 0311)  . [START ON 07/05/2015] vancomycin     acetaminophen **OR** acetaminophen, albuterol, levalbuterol, LORazepam, ondansetron **OR** ondansetron (ZOFRAN) IV  Exam: Alert no distress +JVD Chest bilat insp rales 1/2 up, coarse basilar exp wheeze bilat Irreg irreg rhythm no MRG Abd obese, soft ntnd no hsm noted  GU defer MS no joint chgs Ext no LE or UE edema Neuro nonfocal, no asterixis, Ox 3  Na 135 K 4.6 BUN 93 Creat 3.64 Ca 8.0 AST 1408 ALT 679 BNP 4233 Trop 1.09 > 0.85 WBC 7k Hb 10 plt 186 CXR vasc congestion, which is new from prior films. No overt edema  Assessment: 1 SOB/ cough/ hypoxemia - suspected this is CHF could be vol overload and/or new cardiac disease. No edema elsewhere.  New afib.  2 CAD hx CABG 3 New onset afib/ aflutter - per prim 4 HTN takes lasix/ hydral/ metop; bp good 5 HL on alirocumab (Praluent) injections   Plan - cont IV lasix, watch for response. Get ECHO   Vinson Moselle MD [facility] pager [phone]    cell [phone] 07/04/2015, 8:41 AM    Recent Labs Lab 07/03/15 1236 07/04/15 0427  NA 135 131*  K 4.6  4.3  CL 96* 94*  CO2 18* 17*  GLUCOSE 93 124*  BUN 93* 111*  CREATININE 3.64* 3.37*  CALCIUM 8.0* 7.5*    Recent Labs Lab 07/03/15 1236 07/04/15 0427  AST 1408* 647*  ALT 679* 538*  ALKPHOS 132* 116  BILITOT 1.2 0.9  PROT 6.6 6.4*  ALBUMIN 3.9 3.6    Recent Labs Lab 07/03/15 1236 07/04/15 0427  WBC 7.7 8.2  NEUTROABS 5.8  --   HGB 10.3* 12.0  HCT 30.4* 36.6  MCV 96.2 97.1  PLT 186 205

## 2015-07-04 NOTE — ED Notes (Signed)
On call hospitalist paged at this time to reassess need for stepdown bed

## 2015-07-04 NOTE — Progress Notes (Addendum)
[facility] PROGRESS NOTE  [name] DJS:[id number] DOB: [birth year] DOA: 07/03/2015 PCP: Michiel Sites, MD  Length of stay: 1   Assessment/Plan: Active Problems:   CAD in native artery - CABG x4 (LIMA-LAD, SVG-distal RCA, SVG-diagonal, SVG-Ramus   Hypertension, essential   Chronic renal insufficiency, stage III (moderate)   Acute on chronic renal failure (HCC)   Dyspnea   Transaminitis   Atrial fibrillation (HCC)   Elevated troponin   Unintentional weight loss   Lactic acidosis   Sepsis (HCC)   Community acquired pneumonia    HPI: [name] is a 80 y.o. female with a past medical history of coronary artery disease, peripheral vascular disease, chronic kidney disease stage III, who was in her usual state of health until sometime in December when she started developing a cough which was dry. She had difficulty breathing. She was seen by her provider who prescribed an antibiotic regimen. Chest x-ray did not show any pneumonia. She took these medications for 10 days. Did not feel any better. And then about 4 days ago she went back to see her primary care physician. This time she was experiencing greenish expectoration with cough. She had a fever up to 102F. She was wheezing. She was given a steroid injection. She was prescribed Ceftin.  In the emergency department evaluation revealed elevated BNP and troponin level. New-onset atrial fibrillation with rapid ventricular response. Lactic acid was also elevated. WBC was normal. She was noted to have acute on chronic renal failure.   Assessment and plan  #1 Cough with dyspnea: Chest x-ray does not show any infiltrates. No evidence for pulmonary edema as well as. She does have some wheezing bilaterally. Continue steroids. It is reasonable to continue with broad-spectrum antibiotics for now. Influenza PCR negative. Droplet precautions for now. Repeat chest x-ray. If Chest x-ray negative for pneumonia, DC  antibiotics   #2 acute on chronic kidney disease, stage III: Baseline creatinine unknown, now 3.64> 3.37. Etiology is unclear for acute worsening in her renal function. Nephrology has been consulted. Ultrasound of the kidneys show chronic medico renal disease. Monitor urine output closely. UA negative  #3 Transaminitis: Likely secondary to alcohol abuse, and hepatic steatosis from alcohol use . AST greater than ALT, improving. Abdomen is benign. Ultrasound of the hepatobiliary system completed . Hepatitis panel   ordered. Repeat LFTs tomorrow. Could also be   passive congestion from cardiac etiology.  #4 elevated troponin, possible non-ST elevation MI: Troponins abnormal. Patient does not report any chest pains. She has been seen by cardiology and placed on IV heparin. Echocardiogram pending.   #5 New onset atrial fibrillation: This is a new diagnosis for her. Chads2vasc score is 5. Continue beta blocker. Cardiology is following. Heart rate is reasonably well controlled.Transition to Coumadin or NOAC , pending renal function. could treat with short term anticoagulation and then proceed with DCCV to see if she holds sinus and then DC anticoagulation 4 weeks later in the setting of her abnormal liver and renal function, TSH nl  #6 history of coronary artery disease: Stable. Continue with her home medications.  #7 alcohol abuse-started on CIWA protocol, this could be contributing to her atrial fibrillation, abnormal liver function   DVT prophylaxsis Heparin drip  Code Status:      Code Status Orders        Start     Ordered   07/03/15 2209  Full code   Continuous     07/03/15 2208  Family Communication: Discussed in detail with the patient, all imaging results, lab results explained to the patient   Disposition Plan: Patient  recently admitted, pending improvement in liver, renal function      Consultants: Nephrology Cardiology  Procedures: None  Antibiotics: Anti-infectives    Start     Dose/Rate Route Frequency Ordered Stop   07/05/15 1200  vancomycin (VANCOCIN) IVPB 1000 mg/200 mL premix     1,000 mg 200 mL/hr over 60 Minutes Intravenous Every 48 hours 07/03/15 1354 07/11/15 1159   07/04/15 1300  ceFEPIme (MAXIPIME) 1 g in dextrose 5 % 50 mL IVPB     1 g 100 mL/hr over 30 Minutes Intravenous Every 24 hours 07/03/15 1650     07/03/15 1230  vancomycin (VANCOCIN) IVPB 1000 mg/200 mL premix     1,000 mg 200 mL/hr over 60 Minutes Intravenous  Once 07/03/15 1213 07/03/15 1607   07/03/15 1215  ceFEPIme (MAXIPIME) 2 g in dextrose 5 % 50 mL IVPB     2 g 100 mL/hr over 30 Minutes Intravenous  Once 07/03/15 1207 07/03/15 1333         HPI/Subjective: Patient states that her shortness of breath is significantly better, continues to be tachycardic and in atrial fibrillation. No nausea no vomiting no abdominal pain   Objective: Filed Vitals:   07/04/15 0630 07/04/15 0836 07/04/15 0856 07/04/15 1025  BP: 157/93  151/89 157/109  Pulse:   88 72  Temp:   97.5 F (36.4 C)   TempSrc:   Oral   Resp: 11  20   Height:      Weight:      SpO2:  92% 97%     Intake/Output Summary (Last 24 hours) at 07/04/15 1100 Last data filed at 07/03/15 1607  Gross per 24 hour  Intake    350 ml  Output      0 ml  Net    350 ml    Exam:  Resp: Scattered wheezing heard bilaterally. Coarse breath sounds. No obvious crackles. Cardio: S1, S2 is irregularly irregular. No S3, S4. No rubs, murmurs, or bruit. No pedal edema. GI: soft, non-tender; bowel sounds normal; no masses, no organomegaly Extremities: extremities normal, atraumatic, no cyanosis or edema Pulses: 2+ and symmetric Skin: Skin color, texture, turgor normal. No rashes or lesions Lymph nodes: Cervical, supraclavicular, and axillary nodes normal. Neurologic: No focal deficits    Data Review   Micro  Results Recent Results (from the past 240 hour(s))  Culture, blood (routine x 2)     Status: None (Preliminary result)   Collection Time: 07/03/15 12:32 PM  Result Value Ref Range Status   Specimen Description BLOOD RIGHT ANTECUBITAL  Final   Special Requests BOTTLES DRAWN AEROBIC AND ANAEROBIC 5CC  Final   Culture   Final    NO GROWTH < 24 HOURS Performed at [facility]    Report Status PENDING  Incomplete  Culture, blood (routine x 2)     Status: None (Preliminary result)   Collection Time: 07/03/15 12:35 PM  Result Value Ref Range Status   Specimen Description BLOOD LEFT ANTECUBITAL  Final   Special Requests BOTTLES DRAWN AEROBIC AND ANAEROBIC 5CC  Final   Culture   Final    NO GROWTH < 24 HOURS Performed at [facility]    Report Status PENDING  Incomplete    Radiology Reports Dg Chest 2 View  07/03/2015  CLINICAL DATA:  Cough, congestion, shortness of breath, and weakness for  3 weeks. EXAM: CHEST  2 VIEW COMPARISON:  05/30/2006 FINDINGS: Mild enlargement of the cardiopericardial silhouette, with mild cephalization of blood flow but no overt edema. No Kerley B-lines observed. Prior CABG.  Atherosclerotic calcification of the aortic arch. Thoracic spondylosis.  No pleural effusion. IMPRESSION: 1. Stable mild enlargement of the cardiopericardial silhouette, with pulmonary venous hypertension but without overt edema. 2. Prior CABG. 3. Atherosclerotic aortic arch. Electronically Signed   By: Gaylyn Rong M.D.   On: 07/03/2015 12:06     CBC  Recent Labs Lab 07/03/15 1236 07/04/15 0427  WBC 7.7 8.2  HGB 10.3* 12.0  HCT 30.4* 36.6  PLT 186 205  MCV 96.2 97.1  MCH 32.6 31.8  MCHC 33.9 32.8  RDW 12.8 13.2  LYMPHSABS 0.9  --   MONOABS 1.0  --   EOSABS 0.0  --   BASOSABS 0.0  --     Chemistries   Recent Labs Lab 07/03/15 1236 07/04/15 0427  NA 135 131*  K 4.6 4.3  CL 96* 94*  CO2 18* 17*  GLUCOSE 93 124*  BUN 93* 111*  CREATININE 3.64* 3.37*   CALCIUM 8.0* 7.5*  AST 1408* 647*  ALT 679* 538*  ALKPHOS 132* 116  BILITOT 1.2 0.9   ------------------------------------------------------------------------------------------------------------------ estimated creatinine clearance is 12.5 mL/min (by C-G formula based on Cr of 3.37). ------------------------------------------------------------------------------------------------------------------ No results for input(s): HGBA1C in the last 72 hours. ------------------------------------------------------------------------------------------------------------------ No results for input(s): CHOL, HDL, LDLCALC, TRIG, CHOLHDL, LDLDIRECT in the last 72 hours. ------------------------------------------------------------------------------------------------------------------  Recent Labs  07/03/15 2247  TSH 1.428   ------------------------------------------------------------------------------------------------------------------ No results for input(s): VITAMINB12, FOLATE, FERRITIN, TIBC, IRON, RETICCTPCT in the last 72 hours.  Coagulation profile  Recent Labs Lab 07/03/15 1236 07/04/15 0427  INR 1.41 1.53*    No results for input(s): DDIMER in the last 72 hours.  Cardiac Enzymes  Recent Labs Lab 07/03/15 1603 07/03/15 2247 07/04/15 0425  TROPONINI 0.85* 0.54* 0.45*   ------------------------------------------------------------------------------------------------------------------ Invalid input(s): POCBNP   CBG: No results for input(s): GLUCAP in the last 168 hours.     Studies: Dg Chest 2 View  07/03/2015  CLINICAL DATA:  Cough, congestion, shortness of breath, and weakness for 3 weeks. EXAM: CHEST  2 VIEW COMPARISON:  05/30/2006 FINDINGS: Mild enlargement of the cardiopericardial silhouette, with mild cephalization of blood flow but no overt edema. No Kerley B-lines observed. Prior CABG.  Atherosclerotic calcification of the aortic arch. Thoracic spondylosis.  No  pleural effusion. IMPRESSION: 1. Stable mild enlargement of the cardiopericardial silhouette, with pulmonary venous hypertension but without overt edema. 2. Prior CABG. 3. Atherosclerotic aortic arch. Electronically Signed   By: Gaylyn Rong M.D.   On: 07/03/2015 12:06      No results found for: HGBA1C Lab Results  Component Value Date   CREATININE 3.37* 07/04/2015       Scheduled Meds: . aspirin EC  81 mg Oral Daily  . ceFEPime (MAXIPIME) IV  1 g Intravenous Q24H  . folic acid  1 mg Oral Daily  . furosemide  120 mg Intravenous 3 times per day  . levalbuterol  0.63 mg Nebulization TID  . methylPREDNISolone (SOLU-MEDROL) injection  60 mg Intravenous Q6H  . metoprolol  50 mg Oral BID  . multivitamin with minerals  1 tablet Oral Daily  . pantoprazole  40 mg Oral Daily  . sodium chloride flush  3 mL Intravenous Q12H  . thiamine  100 mg Oral Daily   Or  . thiamine  100 mg Intravenous Daily  . [  START ON 07/05/2015] vancomycin  1,000 mg Intravenous Q48H   Continuous Infusions: . heparin 950 Units/hr (07/04/15 0311)    Active Problems:   CAD in native artery - CABG x4 (LIMA-LAD, SVG-distal RCA, SVG-diagonal, SVG-Ramus   Hypertension, essential   Chronic renal insufficiency, stage III (moderate)   Acute on chronic renal failure (HCC)   Dyspnea   Transaminitis   Atrial fibrillation (HCC)   Elevated troponin   Unintentional weight loss   Lactic acidosis   Sepsis (HCC)   Community acquired pneumonia    Time spent: 45 minutes   [facility]  [facility] Pager [phone]. If 7PM-7AM, please contact night-coverage at [email or web address], password [email or web address] 07/04/2015, 11:00 AM  LOS: 1 day

## 2015-07-04 NOTE — Progress Notes (Signed)
ANTICOAGULATION CONSULT NOTE - F/u Consult  Pharmacy Consult for heparin Indication: chest pain/ACS and atrial fibrillation  Allergies  Allergen Reactions  . Angiotensin Receptor Blockers   . Latex Swelling  . Statins Other (See Comments)    Diffuse cramping; has tried Lipitor, Crestor, Pravachol and simvastatin  . Benicar [Olmesartan] Itching and Rash    Patient Measurements: Height: 5\' 3"  (160 cm) Weight: 140 lb (63.504 kg) IBW/kg (Calculated) : 52.4 Heparin Dosing Weight: 63 kg  Vital Signs: BP: 152/98 mmHg (02/11 0130) Pulse Rate: 105 (02/11 0000)  Labs:  Recent Labs  07/03/15 1236 07/03/15 1603 07/03/15 2247 07/04/15 0116  HGB 10.3*  --   --   --   HCT 30.4*  --   --   --   PLT 186  --   --   --   LABPROT 16.8*  --   --   --   INR 1.41  --   --   --   HEPARINUNFRC  --   --   --  0.11*  CREATININE 3.64*  --   --   --   TROPONINI 1.09* 0.85* 0.54*  --     Estimated Creatinine Clearance: 11.6 mL/min (by C-G formula based on Cr of 3.64).   Medical History: Past Medical History  Diagnosis Date  . CAD in native artery 2007    a. S/p CABG 2007 (LIMA-LAD, SVG-dRCA, SVG-intermediate, SVG-diagonal). b. Nuclear stress test 08/2012: normal, low risk.  . S/P CABG x 4 2007    LIMA-LAD, SVG-Distal RCA, SVG-Ramus, SVG-Diagonal; no echocardiogram done  . PAD (peripheral artery disease) (HCC) 1997    Mild-moderate carotid disease; status post right SFA occlusion with Fem-Below Knee Pop bypass (Dr. Hart Rochester); LEA Dopplers 07/2014: RIGHT - ABI 0.84, CIA/EIA 50-69%, CFA/PFA patent Fem-Pop bypass patent w/ 70-99% anastomotic stenosis, patent Pop A with 3 V runoff; LABI 0.85- L CIA/EIA patent, LPFA 70-99%, dLSFA 50-69%, patent LPopA & 3 V runoff.  . Renovascular hypertension 12/17/2013    a. s/p L RA Stent 11/2013. b. Renal duplex 02/2015: >60% right proximal renal artery stenosis, normal left renal artery s/p stent, f/u 1 yr recommended.  . CKD (chronic kidney disease), stage III    . Essential hypertension   . Hyperlipidemia LDL goal <70   . Carotid artery disease (HCC)     a. Mild-mod carotid disease per Dr.Berry's note.  . Anemia     Assessment: Patient's a 80 y.o F presented to the ED on 2/10 with c/o SOB.  Troponin was found to be elevated in the ED and with new onset afib.  To start heparin for Afib and r/o ACS.  Patient reported no recent bleeding events or surgical procedure.  Vancomycin and cefepime given started earlier for PNA.  Pharmacy to dose cefepime in addition to vancomycin per MD's request.   Today, 2/11  0116 HL=0.11, no infusion or bleeding problems per RN  Goal of Therapy:  Heparin level 0.3-0.7 units/ml Monitor platelets by anticoagulation protocol: Yes   Plan:  - Rebolus heparin 1500 units IV x1 then increase drip at 950 units/hr - check 8 hr heparin level - monitor for s/s bleeding   Lorenza Evangelist 07/04/2015,2:25 AM

## 2015-07-05 ENCOUNTER — Inpatient Hospital Stay (HOSPITAL_COMMUNITY): Payer: Medicare Other

## 2015-07-05 ENCOUNTER — Other Ambulatory Visit (HOSPITAL_COMMUNITY): Payer: Medicare Other

## 2015-07-05 DIAGNOSIS — I4891 Unspecified atrial fibrillation: Secondary | ICD-10-CM

## 2015-07-05 DIAGNOSIS — R0602 Shortness of breath: Secondary | ICD-10-CM | POA: Diagnosis present

## 2015-07-05 DIAGNOSIS — I214 Non-ST elevation (NSTEMI) myocardial infarction: Secondary | ICD-10-CM | POA: Insufficient documentation

## 2015-07-05 LAB — CBC
HEMATOCRIT: 32.9 % — AB (ref 36.0–46.0)
HEMOGLOBIN: 10.9 g/dL — AB (ref 12.0–15.0)
MCH: 32 pg (ref 26.0–34.0)
MCHC: 33.1 g/dL (ref 30.0–36.0)
MCV: 96.5 fL (ref 78.0–100.0)
Platelets: 202 10*3/uL (ref 150–400)
RBC: 3.41 MIL/uL — AB (ref 3.87–5.11)
RDW: 12.9 % (ref 11.5–15.5)
WBC: 7.9 10*3/uL (ref 4.0–10.5)

## 2015-07-05 LAB — COMPREHENSIVE METABOLIC PANEL
ALBUMIN: 3.3 g/dL — AB (ref 3.5–5.0)
ALK PHOS: 119 U/L (ref 38–126)
ALT: 695 U/L — AB (ref 14–54)
ANION GAP: 17 — AB (ref 5–15)
AST: 802 U/L — ABNORMAL HIGH (ref 15–41)
BILIRUBIN TOTAL: 0.9 mg/dL (ref 0.3–1.2)
BUN: 132 mg/dL — ABNORMAL HIGH (ref 6–20)
CALCIUM: 7.7 mg/dL — AB (ref 8.9–10.3)
CO2: 21 mmol/L — AB (ref 22–32)
CREATININE: 3.9 mg/dL — AB (ref 0.44–1.00)
Chloride: 92 mmol/L — ABNORMAL LOW (ref 101–111)
GFR calc Af Amer: 12 mL/min — ABNORMAL LOW (ref >60)
GFR calc non Af Amer: 10 mL/min — ABNORMAL LOW (ref >60)
GLUCOSE: 143 mg/dL — AB (ref 65–99)
Potassium: 3.8 mmol/L (ref 3.5–5.1)
Sodium: 130 mmol/L — ABNORMAL LOW (ref 135–145)
TOTAL PROTEIN: 5.9 g/dL — AB (ref 6.5–8.1)

## 2015-07-05 LAB — HEPATITIS PANEL, ACUTE
HCV Ab: 0.1 s/co ratio (ref 0.0–0.9)
HCV Ab: <0.1 s/co ratio (ref 0.0–0.9)
HEP B C IGM: NEGATIVE
HEP B S AG: NEGATIVE
HEP B S AG: NEGATIVE
Hep A IgM: NEGATIVE
Hep A IgM: NEGATIVE
Hep B C IgM: NEGATIVE

## 2015-07-05 LAB — HEPARIN LEVEL (UNFRACTIONATED)
HEPARIN UNFRACTIONATED: 0.47 [IU]/mL (ref 0.30–0.70)
HEPARIN UNFRACTIONATED: 0.55 [IU]/mL (ref 0.30–0.70)
Heparin Unfractionated: 0.26 IU/mL — ABNORMAL LOW (ref 0.30–0.70)

## 2015-07-05 MED ORDER — HEPARIN (PORCINE) IN NACL 100-0.45 UNIT/ML-% IJ SOLN
1100.0000 [IU]/h | INTRAMUSCULAR | Status: DC
  Administered 2015-07-05 – 2015-07-06 (×2): 1100 [IU]/h via INTRAVENOUS
  Filled 2015-07-05 (×4): qty 250

## 2015-07-05 MED ORDER — DIPHENHYDRAMINE HCL 25 MG PO CAPS
25.0000 mg | ORAL_CAPSULE | Freq: Once | ORAL | Status: AC
  Administered 2015-07-05: 25 mg via ORAL
  Filled 2015-07-05: qty 1

## 2015-07-05 MED ORDER — FUROSEMIDE 10 MG/ML IJ SOLN
80.0000 mg | Freq: Two times a day (BID) | INTRAMUSCULAR | Status: DC
  Administered 2015-07-06: 80 mg via INTRAVENOUS
  Filled 2015-07-05: qty 8

## 2015-07-05 MED ORDER — METHYLPREDNISOLONE SODIUM SUCC 125 MG IJ SOLR
60.0000 mg | Freq: Two times a day (BID) | INTRAMUSCULAR | Status: DC
  Administered 2015-07-06 (×2): 60 mg via INTRAVENOUS
  Filled 2015-07-05 (×2): qty 0.96

## 2015-07-05 MED ORDER — LEVALBUTEROL HCL 0.63 MG/3ML IN NEBU
0.6300 mg | INHALATION_SOLUTION | Freq: Two times a day (BID) | RESPIRATORY_TRACT | Status: DC
  Administered 2015-07-05 – 2015-07-09 (×8): 0.63 mg via RESPIRATORY_TRACT
  Filled 2015-07-05 (×8): qty 3

## 2015-07-05 MED ORDER — LEVALBUTEROL HCL 0.63 MG/3ML IN NEBU: 0.6300 mg | INHALATION_SOLUTION | RESPIRATORY_TRACT | Status: DC | PRN

## 2015-07-05 NOTE — Progress Notes (Signed)
Initial Nutrition Assessment  DOCUMENTATION CODES:   Obesity unspecified  INTERVENTION:   Provide daily morning snack RD to continue to monitor for additional needs  NUTRITION DIAGNOSIS:   Increased nutrient needs related to other (see comment) (ETOH) as evidenced by estimated needs.  GOAL:   Patient will meet greater than or equal to 90% of their needs  MONITOR:   PO intake, Labs, Weight trends, I & O's  REASON FOR ASSESSMENT:   Malnutrition Screening Tool    ASSESSMENT:   80 y.o. female with a past medical history of coronary artery disease, peripheral vascular disease, chronic kidney disease stage III, who was in her usual state of health until sometime in December when she started developing a cough which was dry. She had difficulty breathing. She was seen by her provider who prescribed an antibiotic regimen.   Patient in room with husband and daughter at bedside. Per pt, she is eating well. She had just received her lunch tray but had a late breakfast so she is going to wait to eat anything. Pt reports poor appetite over the last week d/t  SOB. Usually pt eats twice a day and would mainly eat soups, salads. Main protein sources are nuts, peanut butter, eggs and cheese. Pt likes cottage cheese and would like this as daily snack, RD to order. Pt reports weight loss but denies 30 lb of weight loss. Per weight history, pt's weight has remained ~170 lb since 2015. Pt with history of ETOH abuse per family. Pt does not like Ensure/Boost drinks, states they are too sweet.   Pt with mild-moderate fat depletion and no muscle depletion.  Medications: folic acid tablet daily, IV Lasix, MVI, thiamine tablet daily  Labs reviewed: Low Na Elevated BUN, Creatinine  Diet Order:  Diet Heart Room service appropriate?: Yes; Fluid consistency:: Thin  Skin:  Reviewed, no issues  Last BM:  2/11  Height:   Ht Readings from Last 1 Encounters:  07/04/15 5\' 3"  (1.6 m)    Weight:   Wt  Readings from Last 1 Encounters:  07/05/15 172 lb 9.9 oz (78.3 kg)    Ideal Body Weight:  52.3 kg  BMI:  Body mass index is 30.59 kg/(m^2).  Estimated Nutritional Needs:   Kcal:  1750-1950  Protein:  75-85g  Fluid:  1.9-2L/day  EDUCATION NEEDS:   Education needs addressed  Tilda Franco, MS, RD, LDN Pager: [phone] After Hours Pager: [phone]

## 2015-07-05 NOTE — Progress Notes (Addendum)
ANTICOAGULATION CONSULT NOTE - F/u Consult  Pharmacy Consult for heparin Indication: chest pain/ACS and atrial fibrillation  Allergies  Allergen Reactions  . Angiotensin Receptor Blockers   . Latex Swelling  . Statins Other (See Comments)    Diffuse cramping; has tried Lipitor, Crestor, Pravachol and simvastatin  . Benicar [Olmesartan] Itching and Rash    Patient Measurements: Height:  (160 cm) Weight: 172 lb 9.9 oz (78.3 kg) IBW/kg (Calculated) : 52.4 Heparin Dosing Weight: 63 kg  Vital Signs: Temp: 97.8 F (36.6 C) (02/12 0406) Temp Source: Oral (02/12 0406) BP: 120/99 mmHg (02/12 0406) Pulse Rate: 82 (02/12 0406)  Labs:  Recent Labs  07/03/15 1236 07/03/15 1603 07/03/15 2247  07/04/15 0425 07/04/15 0427 07/04/15 0952 07/04/15 1345 07/04/15 2316 07/05/15 0551 07/05/15 0946  HGB 10.3*  --   --   --   --  12.0  --   --   --  10.9*  --   HCT 30.4*  --   --   --   --  36.6  --   --   --  32.9*  --   PLT 186  --   --   --   --  205  --   --   --  202  --   LABPROT 16.8*  --   --   --   --  17.9*  --   --   --   --   --   INR 1.41  --   --   --   --  1.53*  --   --   --   --   --   HEPARINUNFRC  --   --   --   < >  --   --   --  0.32 0.26*  --  0.47  CREATININE 3.64*  --   --   --   --  3.37*  --   --   --  3.90*  --   CKTOTAL  --   --   --   --   --   --  318*  --   --   --   --   TROPONINI 1.09* 0.85* 0.54*  --  0.45*  --   --   --   --   --   --   < > = values in this interval not displayed.  Estimated Creatinine Clearance: 12 mL/min (by C-G formula based on Cr of 3.9).   Medical History: Past Medical History  Diagnosis Date  . CAD in native artery 2007    a. S/p CABG 2007 (LIMA-LAD, SVG-dRCA, SVG-intermediate, SVG-diagonal). b. Nuclear stress test 08/2012: normal, low risk.  . S/P CABG x 4 2007    LIMA-LAD, SVG-Distal RCA, SVG-Ramus, SVG-Diagonal; no echocardiogram done  . PAD (peripheral artery disease) (HCC) 1997    Mild-moderate carotid disease;  status post right SFA occlusion with Fem-Below Knee Pop bypass (Dr. Hart Rochester); LEA Dopplers 07/2014: RIGHT - ABI 0.84, CIA/EIA 50-69%, CFA/PFA patent Fem-Pop bypass patent w/ 70-99% anastomotic stenosis, patent Pop A with 3 V runoff; LABI 0.85- L CIA/EIA patent, LPFA 70-99%, dLSFA 50-69%, patent LPopA & 3 V runoff.  . Renovascular hypertension 12/17/2013    a. s/p L RA Stent 11/2013. b. Renal duplex 02/2015: >60% right proximal renal artery stenosis, normal left renal artery s/p stent, f/u 1 yr recommended.  . CKD (chronic kidney disease), stage III   . Essential hypertension   . Hyperlipidemia LDL goal <  70   . Carotid artery disease (HCC)     a. Mild-mod carotid disease per Dr.Berry's note.  . Anemia     Assessment: Patient's a 80 y.o F presented to the ED on 2/10 with c/o SOB.  Troponin was found to be elevated in the ED and with new onset afib.  To start heparin for Afib and r/o ACS.  Patient reported no recent bleeding events or surgical procedure.  Today, 07/05/2015:  HL=0.47, therapeutic after rate increase to 1100 units/hr  CBC stable No bleeding/complications reported.   Goal of Therapy:  Heparin level 0.3-0.7 units/ml Monitor platelets by anticoagulation protocol: Yes   Plan:  - Continue heparin infusion at 1100 units/hr - Check 8 hr heparin level to confirm rate - Daily CBC and HL while on heparin infusion  Clance Boll 07/05/2015,10:41 AM  ADDENDUM: 07/05/2015 6:57 PM Heparin level this evening =  0.55, remains therapeutic No complications reported Plan: - Continue heparin infusion at 1100 units/hr - F/u CBC and HL in AM  Clance Boll, PharmD, BCPS Pager: [phone] 07/05/2015 6:58 PM

## 2015-07-05 NOTE — Progress Notes (Signed)
  Echocardiogram 2D Echocardiogram has been performed.  [name] 07/05/2015, 9:45 AM

## 2015-07-05 NOTE — Progress Notes (Signed)
Utilization review completed.  

## 2015-07-05 NOTE — Progress Notes (Addendum)
[facility] Progress Note   Subjective: BUN/ Creat up w diuresis.  Most of urine output not collected/ recorded per patient.  Breathing much better.  CXR today vasc congestion, not much change.  No edema.   Filed Vitals:   07/04/15 2225 07/05/15 0406 07/05/15 1024 07/05/15 1358  BP: 144/77 120/99  136/88  Pulse: 66 82  82  Temp: 97.8 F (36.6 C) 97.8 F (36.6 C)  97.9 F (36.6 C)  TempSrc: Oral Oral  Oral  Resp: 20 22    Height:      Weight:  78.3 kg (172 lb 9.9 oz)    SpO2: 100% 100% 96% 100%    Inpatient medications: . aspirin EC  81 mg Oral Daily  . ceFEPime (MAXIPIME) IV  1 g Intravenous Q24H  . folic acid  1 mg Oral Daily  . levalbuterol  0.63 mg Nebulization BID  . methylPREDNISolone (SOLU-MEDROL) injection  60 mg Intravenous Q6H  . metoprolol  50 mg Oral BID  . multivitamin with minerals  1 tablet Oral Daily  . pantoprazole  40 mg Oral Daily  . sodium chloride flush  3 mL Intravenous Q12H  . thiamine  100 mg Oral Daily   Or  . thiamine  100 mg Intravenous Daily  . vancomycin  1,000 mg Intravenous Q48H   . heparin 1,100 Units/hr (07/05/15 0116)   acetaminophen **OR** acetaminophen, albuterol, levalbuterol, LORazepam **OR** LORazepam, LORazepam, ondansetron **OR** ondansetron (ZOFRAN) IV  Exam: Alert no distress No jvd Chest scant rale L base o/w clear, wheezes resolved Irreg irreg rhythm no MRG Abd obese, soft ntnd no hsm noted  GU defer MS no joint chgs Ext no LE or UE edema Neuro nonfocal, no asterixis, Ox 3  Na 135 K 4.6 BUN 93 Creat 3.64 Ca 8.0 AST 1408 ALT 679 BNP 4233 Trop 1.09 > 0.85 WBC 7k Hb 10 plt 186 CXR vasc congestion, which is new from prior films. No overt edema  Assessment: 1 SOB/ cough/ hypoxemia - resolving w diuresis. Very poor LV function, and new afib. Therefore bump in creat from baseline mid 2's.  BUN/Cr worse, will decrease lasix dose.  No gross uremic symptoms but at risk for requiring dialysis if  develops confusion or some other uremic complication.  Lungs sound much better and dyspnea mostly gone.  2 CAD hx CABG 3 Severe dilated / CM EF 20-25% 4 New onset afib/ aflutter - per prim 5 HTN takes lasix/ hydral/ metop; bp good 6 HL on alirocumab (Praluent) injections 7 ID - no fever 8 ^LFT's - etoh vs congestion  Plan - decreased lasix. Will dc vanc in setting of severe renal failure. Will follow up tomorrow.    Vinson Moselle MD [facility] pager [phone]    cell [phone] 07/05/2015, 4:52 PM    Recent Labs Lab 07/03/15 1236 07/04/15 0427 07/05/15 0551  NA 135 131* 130*  K 4.6 4.3 3.8  CL 96* 94* 92*  CO2 18* 17* 21*  GLUCOSE 93 124* 143*  BUN 93* 111* 132*  CREATININE 3.64* 3.37* 3.90*  CALCIUM 8.0* 7.5* 7.7*    Recent Labs Lab 07/03/15 1236 07/04/15 0427 07/05/15 0551  AST 1408* 647* 802*  ALT 679* 538* 695*  ALKPHOS 132* 116 119  BILITOT 1.2 0.9 0.9  PROT 6.6 6.4* 5.9*  ALBUMIN 3.9 3.6 3.3*    Recent Labs Lab 07/03/15 1236 07/04/15 0427 07/05/15 0551  WBC 7.7 8.2 7.9  NEUTROABS 5.8  --   --  HGB 10.3* 12.0 10.9*  HCT 30.4* 36.6 32.9*  MCV 96.2 97.1 96.5  PLT 186 205 202

## 2015-07-05 NOTE — Progress Notes (Signed)
[facility] PROGRESS NOTE  [name]:[id number] DOB: [birth year] DOA: 07/03/2015 PCP: Michiel Sites, MD  Assessment/Plan: #1 cough shortness of breath Chest x-ray negative for any infiltrates. Repeat chest x-ray today with some vascular congestion. Patient noted to have some wheezing on admission however that has seemed to have improved. Influenza PCR is negative. Discontinue vancomycin. Discontinue IV cefepime. Continue steroid taper, nebulizer chief net, diuretics.  #2 acute on chronic kidney disease stage III Unknown baseline. Renal function worsening with diuresis however some clinical improvement. Abdominal ultrasound showing chronic medical renal disease. Urinalysis is negative. Nephrology following.  #3 transaminitis Differential includes hepatic steatosis from alcohol use as noted on abdominal ultrasound versus secondary to drug-induced secondary to patient's home medication of alirocumab. Patient currently asymptomatic. LFTs have trended down since admission however no significant change over the past 24 hours. Will have patient discontinue alirocumab. Acute hepatitis panel negative. Follow. Will consult with patient's gastroenterologist tomorrow.  #4 new onset atrial fibrillation with RVR/cardiomyopathy   2-D echo with severely reduced systolic function with EF of 20-25% with diffuse hypokinesis and akinesis of the anteroseptal and inferolateral myocardium. Severe left atrial enlargement, moderate MR, mild AI, mild RAE, moderately reduced right ventricular function. Continue beta blocker for rate control. Patient on IV heparin for anticoagulation. Unable to place patient on ACE inhibitor secondary to worsening renal function. Alcohol cessation.  #5 elevated troponin /NSTEMI Patient asymptomatic. Patient currently on IV heparin. 2-D echo with a severely reduced systolic function with EF of 20-25% with diffuse hypokinesis and akinesis of the anteroseptal and  inferolateral myocardium. Patient likely may need ischemic evaluation. Continue beta blocker, aspirin. Unable to place on ACE inhibitor secondary to worsening renal function. Per cardiology.  #6 history of coronary artery disease/Cardiomyopathy 2-D echo with a severely depressed EF of 20-25% with diffuse hypokinesis and akinesis of the anteroseptal and inferolateral myocardium. Continue beta blocker, aspirin. Unable to place on ACE inhibitor secondary to worsening renal function. Alcohol cessation.  #7 alcohol dependence Patient minimizing alcohol use. Continue the Ativan withdrawal protocol.  #8 prophylaxis Heparin for DVT prophylaxis.   Code Status: Full Family Communication: Updated patient and family at bedside. Disposition Plan: Remain on telemetry.   Consultants:  Nephrology: Dr.Schertz 07/03/2015  Cardiology: Dr. Allyson Sabal 07/03/2015   Procedures:  2-D echo 07/05/2015  chest x-ray 07/03/2015, 07/05/2015  Abdominal ultrasound 07/04/2015  Antibiotics:  IV cefepime 07/04/2015>>>>> 07/05/2015   IV vancomycin 07/03/2015>>>>> 07/05/2015  HPI/Subjective: Patient denies any chest pain. Patient denies any shortness of breath. Patient states she is making urine.  Objective: Filed Vitals:   07/04/15 2225 07/05/15 0406  BP: 144/77 120/99  Pulse: 66 82  Temp: 97.8 F (36.6 C) 97.8 F (36.6 C)  Resp: 20 22    Intake/Output Summary (Last 24 hours) at 07/05/15 1226 Last data filed at 07/05/15 1100  Gross per 24 hour  Intake 1103.15 ml  Output    550 ml  Net 553.15 ml   Filed Weights   07/03/15 1227 07/04/15 0814 07/05/15 0406  Weight: 63.504 kg (140 lb) 78.064 kg (172 lb 1.6 oz) 78.3 kg (172 lb 9.9 oz)    Exam:   General:  NAD  Cardiovascular: irregularly irregular  Respiratory:  someSCATTEREDcoarse breath sounds.  Abdomen: soft, nontender, nondistended, positive bowel sounds.   Musculoskeletal:  no clubbing cyanosis or edema.   Data Reviewed: Basic  Metabolic Panel:  Recent Labs Lab 07/03/15 1236 07/04/15 0427 07/05/15 0551  NA 135 131* 130*  K 4.6 4.3 3.8  CL  96* 94* 92*  CO2 18* 17* 21*  GLUCOSE 93 124* 143*  BUN 93* 111* 132*  CREATININE 3.64* 3.37* 3.90*  CALCIUM 8.0* 7.5* 7.7*   Liver Function Tests:  Recent Labs Lab 07/03/15 1236 07/04/15 0427 07/05/15 0551  AST 1408* 647* 802*  ALT 679* 538* 695*  ALKPHOS 132* 116 119  BILITOT 1.2 0.9 0.9  PROT 6.6 6.4* 5.9*  ALBUMIN 3.9 3.6 3.3*   No results for input(s): LIPASE, AMYLASE in the last 168 hours. No results for input(s): AMMONIA in the last 168 hours. CBC:  Recent Labs Lab 07/03/15 1236 07/04/15 0427 07/05/15 0551  WBC 7.7 8.2 7.9  NEUTROABS 5.8  --   --   HGB 10.3* 12.0 10.9*  HCT 30.4* 36.6 32.9*  MCV 96.2 97.1 96.5  PLT 186 205 202   Cardiac Enzymes:  Recent Labs Lab 07/03/15 1236 07/03/15 1603 07/03/15 2247 07/04/15 0425 07/04/15 0952  CKTOTAL  --   --   --   --  318*  TROPONINI 1.09* 0.85* 0.54* 0.45*  --    BNP (last 3 results)  Recent Labs  07/03/15 1236  BNP 4223.3*    ProBNP (last 3 results) No results for input(s): PROBNP in the last 8760 hours.  CBG: No results for input(s): GLUCAP in the last 168 hours.  Recent Results (from the past 240 hour(s))  Culture, blood (routine x 2)     Status: None (Preliminary result)   Collection Time: 07/03/15 12:32 PM  Result Value Ref Range Status   Specimen Description BLOOD RIGHT ANTECUBITAL  Final   Special Requests BOTTLES DRAWN AEROBIC AND ANAEROBIC 5CC  Final   Culture   Final    NO GROWTH 2 DAYS Performed at [facility]    Report Status PENDING  Incomplete  Culture, blood (routine x 2)     Status: None (Preliminary result)   Collection Time: 07/03/15 12:35 PM  Result Value Ref Range Status   Specimen Description BLOOD LEFT ANTECUBITAL  Final   Special Requests BOTTLES DRAWN AEROBIC AND ANAEROBIC 5CC  Final   Culture   Final    NO GROWTH 2 DAYS Performed at  [facility]    Report Status PENDING  Incomplete     Studies: Dg Chest 2 View  07/05/2015  CLINICAL DATA:  80 year old female with shortness of breath. EXAM: CHEST  2 VIEW COMPARISON:  07/03/2015 and prior exams FINDINGS: Cardiomegaly, CABG changes and mild pulmonary vascular congestion again noted. There is no evidence of focal airspace disease, pulmonary edema, suspicious pulmonary nodule/mass, pleural effusion, or pneumothorax. No acute bony abnormalities are identified. IMPRESSION: Cardiomegaly with mild pulmonary vascular congestion. Electronically Signed   By: Harmon Pier M.D.   On: 07/05/2015 09:26   US Abdomen Complete  07/04/2015  CLINICAL DATA:  80 year old female with transaminitis/ elevated LFTs. Patient with chronic renal disease. EXAM: ABDOMEN ULTRASOUND COMPLETE COMPARISON:  None. FINDINGS: Gallbladder: The gallbladder is unremarkable. There is no evidence of acute cholecystitis or cholelithiasis. Common bile duct: Diameter: 5 mm. There is no evidence of intrahepatic or extrahepatic biliary dilatation. Liver: Slightly increased and heterogeneous hepatic echotexture identified without focal lesions. IVC: No abnormality visualized. Pancreas: Visualized portion unremarkable. Spleen: Size and appearance within normal limits. Right Kidney: Length: 11.1 cm. Cortical thinning and increased renal echogenicity noted. There is no evidence of hydronephrosis or solid mass. Left Kidney: Length: 11.1 cm. Cortical thinning and increased renal echogenicity noted. There is no evidence of hydronephrosis or solid mass. Abdominal  aorta: No aneurysm visualized. Other findings: None. IMPRESSION: Slightly increased and heterogeneous hepatic echotexture which is nonspecific but may represent hepatic steatosis. Cortical thinning and increased renal echogenicity compatible with medical renal disease. No other significant abnormalities. Electronically Signed   By: Harmon Pier M.D.   On: 07/04/2015 12:29     Scheduled Meds: . aspirin EC  81 mg Oral Daily  . ceFEPime (MAXIPIME) IV  1 g Intravenous Q24H  . folic acid  1 mg Oral Daily  . furosemide  120 mg Intravenous 3 times per day  . levalbuterol  0.63 mg Nebulization BID  . methylPREDNISolone (SOLU-MEDROL) injection  60 mg Intravenous Q6H  . metoprolol  50 mg Oral BID  . multivitamin with minerals  1 tablet Oral Daily  . pantoprazole  40 mg Oral Daily  . sodium chloride flush  3 mL Intravenous Q12H  . thiamine  100 mg Oral Daily   Or  . thiamine  100 mg Intravenous Daily  . vancomycin  1,000 mg Intravenous Q48H   Continuous Infusions: . heparin 1,100 Units/hr (07/05/15 0116)    Active Problems:   CAD in native artery - CABG x4 (LIMA-LAD, SVG-distal RCA, SVG-diagonal, SVG-Ramus   Hypertension, essential   Chronic renal insufficiency, stage III (moderate)   Acute on chronic renal failure (HCC)   Dyspnea   Transaminitis   Atrial fibrillation (HCC)   Elevated troponin   Unintentional weight loss   Lactic acidosis   Sepsis (HCC)   Community acquired pneumonia    Time spent: 40 minutes    San Lohmeyer M.D. [facility] Pager [phone]. If 7PM-7AM, please contact night-coverage at [email or web address], password [facility] 07/05/2015, 12:26 PM  LOS: 2 days

## 2015-07-05 NOTE — Evaluation (Signed)
Physical Therapy Evaluation Patient Details Name: [name] MRN: [id number] DOB: [birth year] Today's Date: 07/05/2015   History of Present Illness  80 yo female admitted with acute on chronic renal failure, Afib. Hx of ETOH abuse  Clinical Impression  On eval, pt required Min assist for mobility-walked ~100 feet with pt holding on to IV pole. Pt is unsteady. Dyspnea 2/4. Difficulty getting pulse ox reading-unsure if readings are accurate (99% RA at rest, 93% RA after ambulation, HR 89-91 bpm). Recommend to pt and family that pt consider using RW until stability improves.     Follow Up Recommendations Home health PT;Supervision/Assistance - 24 hour    Equipment Recommendations  Rolling walker with 5" wheels (may need depending on progress. will continue to assess. if she doesn't already have one)    Recommendations for Other Services       Precautions / Restrictions Precautions Precautions: Fall Precaution Comments: monitor sats, HR Restrictions Weight Bearing Restrictions: No      Mobility  Bed Mobility Overal bed mobility: Needs Assistance Bed Mobility: Supine to Sit     Supine to sit: Min guard     General bed mobility comments: close guard for safety. Increased time. Mildly effortful for pt. vcs for technique. bedrail used.   Transfers Overall transfer level: Needs assistance   Transfers: Sit to/from Stand Sit to Stand: Min assist         General transfer comment: assist to rise, stabilize, control descent. vcs safety, hand placement. unsteady.  Ambulation/Gait Ambulation/Gait assistance: Min assist Ambulation Distance (Feet): 100 Feet Assistive device:  (IV pole) Gait Pattern/deviations: Step-through pattern;Decreased stride length     General Gait Details: slow gait speed. Pt relied on IV pole. Assist to stabilize. LOB especially with head turns. dyspnea 2/4  Stairs            Wheelchair Mobility    Modified Rankin (Stroke Patients  Only)       Balance Overall balance assessment: Needs assistance         Standing balance support: Single extremity supported;During functional activity Standing balance-Leahy Scale: Poor                               Pertinent Vitals/Pain Pain Assessment: No/denies pain    Home Living Family/patient expects to be discharged to:: Private residence Living Arrangements: Spouse/significant other Available Help at Discharge: Family Type of Home:  (condo) Home Access: Stairs to enter   Secretary/administrator of Steps: 2 Home Layout: Two level;Able to live on main level with bedroom/bathroom Home Equipment: Gilmer Mor - single point      Prior Function Level of Independence: Independent               Hand Dominance        Extremity/Trunk Assessment   Upper Extremity Assessment: Overall WFL for tasks assessed           Lower Extremity Assessment: Generalized weakness      Cervical / Trunk Assessment: Normal  Communication   Communication: No difficulties  Cognition Arousal/Alertness: Awake/alert Behavior During Therapy: WFL for tasks assessed/performed Overall Cognitive Status: Within Functional Limits for tasks assessed                      General Comments      Exercises        Assessment/Plan    PT Assessment Patient needs continued PT services  PT Diagnosis  Difficulty walking;Generalized weakness   PT Problem List Decreased strength;Decreased activity tolerance;Decreased balance;Decreased mobility;Decreased knowledge of use of DME  PT Treatment Interventions Gait training;Functional mobility training;Therapeutic activities;Patient/family education;Balance training;Therapeutic exercise;DME instruction   PT Goals (Current goals can be found in the Care Plan section) Acute Rehab PT Goals PT Goal Formulation: With patient/family Time For Goal Achievement: 07/19/15 Potential to Achieve Goals: Fair    Frequency Min 3X/week    Barriers to discharge        Co-evaluation               End of Session Equipment Utilized During Treatment: Gait belt Activity Tolerance: Patient limited by fatigue Patient left: in chair;with call bell/phone within reach;with chair alarm set;with family/visitor present           Time: 4098-1191 PT Time Calculation (min) (ACUTE ONLY): 26 min   Charges:   PT Evaluation $PT Eval Moderate Complexity: 1 Procedure PT Treatments $Gait Training: 8-22 mins   PT G Codes:        Rebeca Alert, MPT Pager: [phone]

## 2015-07-05 NOTE — Progress Notes (Signed)
    Subjective:  Denies CP; dyspnea improving   Objective:  Filed Vitals:   07/04/15 1421 07/04/15 1448 07/04/15 2225 07/05/15 0406  BP:  117/80 144/77 120/99  Pulse:  101 66 82  Temp:  97.7 F (36.5 C) 97.8 F (36.6 C) 97.8 F (36.6 C)  TempSrc:  Oral Oral Oral  Resp:  20 20 22   Height:      Weight:    172 lb 9.9 oz (78.3 kg)  SpO2: 92% 100% 100% 100%    Intake/Output from previous day:  Intake/Output Summary (Last 24 hours) at 07/05/15 0716 Last data filed at 07/05/15 2500  Gross per 24 hour  Intake 863.15 ml  Output    250 ml  Net 613.15 ml    Physical Exam: Physical exam: Well-developed well-nourished in no acute distress.  Skin is warm and dry.  HEENT is normal.  Neck is supple.  Chest with mild rhonchi Cardiovascular exam is irregular Abdominal exam nontender or distended. No masses palpated. Extremities show no edema. neuro grossly intact    Lab Results: Basic Metabolic Panel:  Recent Labs  37/04/88 1236 07/04/15 0427  NA 135 131*  K 4.6 4.3  CL 96* 94*  CO2 18* 17*  GLUCOSE 93 124*  BUN 93* 111*  CREATININE 3.64* 3.37*  CALCIUM 8.0* 7.5*   CBC:  Recent Labs  07/03/15 1236 07/04/15 0427 07/05/15 0551  WBC 7.7 8.2 7.9  NEUTROABS 5.8  --   --   HGB 10.3* 12.0 10.9*  HCT 30.4* 36.6 32.9*  MCV 96.2 97.1 96.5  PLT 186 205 202   Cardiac Enzymes:  Recent Labs  07/03/15 1603 07/03/15 2247 07/04/15 0425 07/04/15 0952  CKTOTAL  --   --   --  318*  TROPONINI 0.85* 0.54* 0.45*  --      Assessment/Plan:  80 y/o F with history of CAD s/p CABG 2007 (LIMA-LAD, SVG-dRCA, SVG-intermediate, SVG-diagonal, neg nuc 2014), PVD s/p right femoropopliteal bypass grafting, renal artery stenosis s/p L renal stent 11/2013 (residual RAS being followed for now), reported mild-mod carotid artery disease, CKD stage III, chronic-appearing anemia, former tobacco abuse is admitted with fever, cough, acute kidney injury on CKD (BUN 93/Cr 3.64 - previously 2  range), marked transaminitis (AST/ALT 1408/679), lactic acidosis, elevated BNP and initial troponin 1.09.  1 New onset atrial fibrillation-TSH normal; await echo; continue metoprolol for rate control; continue heparin; transition to coumadin or DOAC later pending renal function; DCCV as outpt if atrial fibrillation persists. She apparently has an ETOH problem per her daughter. Therefore likely not a good long term anticoagulation candidate; however, atrial fibrillation may be related to recent acute illness; as she recovers could treat with short term anticoagulation and then proceed with DCCV to see if she holds sinus and then DC anticoagulation 4 weeks later. 2 Elevated troponin-no chest pain; possibly related to acute renal failure and atrial fibrillation; continue ASA; DC plavix; outpt nuclear study when she recovers from present illness. 3 Acute on chronic stage 3 kidney disease-nephrology following and managing diuretics. 4 ETOH abuse-pt's daughter states pt is alcoholic; may need withdrawal prophylaxis; will leave to primary care. 5 Possible pneumonia-per IM 6 Elevated LFTs-?related to ETOH; improving.    [name] 07/05/2015, 7:16 AM

## 2015-07-05 NOTE — Progress Notes (Signed)
PHARMACY - HEPARIN (brief note)  Patient on IV heparin for chest pain/ACS and AFib  IV heparin infusing @ 1000 units/hr  Heparin level @ 23:16 (2/11) = 0.26 (Goal 0.3-0.7) No complications of therapy noted  Plan:  Increase IV heparin to 1100 units/hr            Recheck heparin level 8 hr after rate increase            Follow CBC in the AM  Terrilee Files, PharmD

## 2015-07-05 NOTE — Progress Notes (Signed)
Pharmacist Heart Failure Core Measure Documentation  Assessment: [name] has an EF documented as 20-25% on 2/12 by ECHO.  Rationale: Heart failure patients with left ventricular systolic dysfunction (LVSD) and an EF < 40% should be prescribed an angiotensin converting enzyme inhibitor (ACEI) or angiotensin receptor blocker (ARB) at discharge unless a contraindication is documented in the medical record.  This patient is not currently on an ACEI or ARB for HF.  This note is being placed in the record in order to provide documentation that a contraindication to the use of these agents is present for this encounter.  ACE Inhibitor or Angiotensin Receptor Blocker is contraindicated (specify all that apply)  []   ACEI allergy AND ARB allergy (pt does have an ARB allergy) []   Angioedema []   Moderate or severe aortic stenosis []   Hyperkalemia []   Hypotension []   Renal artery stenosis [x]   Worsening renal function, preexisting renal disease or dysfunction   Clance Boll 07/05/2015 6:55 PM

## 2015-07-06 ENCOUNTER — Inpatient Hospital Stay (HOSPITAL_COMMUNITY): Payer: Medicare Other

## 2015-07-06 DIAGNOSIS — I481 Persistent atrial fibrillation: Secondary | ICD-10-CM

## 2015-07-06 DIAGNOSIS — I428 Other cardiomyopathies: Secondary | ICD-10-CM | POA: Diagnosis present

## 2015-07-06 DIAGNOSIS — I4819 Other persistent atrial fibrillation: Secondary | ICD-10-CM | POA: Diagnosis present

## 2015-07-06 DIAGNOSIS — I4891 Unspecified atrial fibrillation: Secondary | ICD-10-CM

## 2015-07-06 DIAGNOSIS — I214 Non-ST elevation (NSTEMI) myocardial infarction: Secondary | ICD-10-CM

## 2015-07-06 DIAGNOSIS — R0602 Shortness of breath: Secondary | ICD-10-CM | POA: Insufficient documentation

## 2015-07-06 DIAGNOSIS — I420 Dilated cardiomyopathy: Secondary | ICD-10-CM

## 2015-07-06 LAB — CBC
HCT: 30.7 % — ABNORMAL LOW (ref 36.0–46.0)
Hemoglobin: 10.4 g/dL — ABNORMAL LOW (ref 12.0–15.0)
MCH: 32.4 pg (ref 26.0–34.0)
MCHC: 33.9 g/dL (ref 30.0–36.0)
MCV: 95.6 fL (ref 78.0–100.0)
PLATELETS: 177 10*3/uL (ref 150–400)
RBC: 3.21 MIL/uL — AB (ref 3.87–5.11)
RDW: 12.8 % (ref 11.5–15.5)
WBC: 8.2 10*3/uL (ref 4.0–10.5)

## 2015-07-06 LAB — COMPREHENSIVE METABOLIC PANEL
ALT: 722 U/L — ABNORMAL HIGH (ref 14–54)
AST: 695 U/L — AB (ref 15–41)
Albumin: 3.2 g/dL — ABNORMAL LOW (ref 3.5–5.0)
Alkaline Phosphatase: 119 U/L (ref 38–126)
Anion gap: 17 — ABNORMAL HIGH (ref 5–15)
BILIRUBIN TOTAL: 1.1 mg/dL (ref 0.3–1.2)
BUN: 158 mg/dL — AB (ref 6–20)
CO2: 20 mmol/L — ABNORMAL LOW (ref 22–32)
Calcium: 7.5 mg/dL — ABNORMAL LOW (ref 8.9–10.3)
Chloride: 91 mmol/L — ABNORMAL LOW (ref 101–111)
Creatinine, Ser: 4.36 mg/dL — ABNORMAL HIGH (ref 0.44–1.00)
GFR, EST AFRICAN AMERICAN: 10 mL/min — AB (ref >60)
GFR, EST NON AFRICAN AMERICAN: 9 mL/min — AB (ref >60)
Glucose, Bld: 130 mg/dL — ABNORMAL HIGH (ref 65–99)
POTASSIUM: 3.9 mmol/L (ref 3.5–5.1)
Sodium: 128 mmol/L — ABNORMAL LOW (ref 135–145)
TOTAL PROTEIN: 5.6 g/dL — AB (ref 6.5–8.1)

## 2015-07-06 LAB — MAGNESIUM: MAGNESIUM: 2.2 mg/dL (ref 1.7–2.4)

## 2015-07-06 LAB — HEPARIN LEVEL (UNFRACTIONATED): Heparin Unfractionated: 0.57 IU/mL (ref 0.30–0.70)

## 2015-07-06 MED ORDER — ZOLPIDEM TARTRATE 5 MG PO TABS
5.0000 mg | ORAL_TABLET | Freq: Once | ORAL | Status: AC
  Administered 2015-07-06: 5 mg via ORAL
  Filled 2015-07-06: qty 1

## 2015-07-06 MED ORDER — PREDNISONE 50 MG PO TABS
60.0000 mg | ORAL_TABLET | Freq: Every day | ORAL | Status: AC
  Administered 2015-07-07 – 2015-07-09 (×3): 60 mg via ORAL
  Filled 2015-07-06: qty 1
  Filled 2015-07-06: qty 3
  Filled 2015-07-06: qty 1

## 2015-07-06 NOTE — Progress Notes (Signed)
[name] Progress Note   Subjective:  Better job recording UOP 1400 recorded so far today BUN/creatinine continue to rise despite a reduction in lasix yesterday Pt apparently never shared her CKD issues with her family Not sure she would do HD Family says has been sleepy and a little confused She is awake, denies pain, SOB is less   Filed Vitals:   07/06/15 0900 07/06/15 1017 07/06/15 1030 07/06/15 1420  BP: 129/87   144/92  Pulse:    106  Temp:   97.4 F (36.3 C) 97.3 F (36.3 C)  TempSrc:   Oral Oral  Resp: 20   20  Height:      Weight:      SpO2: 100% 95%  100%   Exam: Alert no distress No jvd Chest ant clear Irreg irreg rhythm no MRG Abd obese, soft ntnd no hsm noted  MS no joint chgs There is no edema of LE's Neuro nonfocal N asterixis, Ox 3  Inpatient medications: . aspirin EC  81 mg Oral Daily  . folic acid  1 mg Oral Daily  . furosemide  80 mg Intravenous Q12H  . levalbuterol  0.63 mg Nebulization BID  . methylPREDNISolone (SOLU-MEDROL) injection  60 mg Intravenous Q12H  . metoprolol  50 mg Oral BID  . multivitamin with minerals  1 tablet Oral Daily  . pantoprazole  40 mg Oral Daily  . sodium chloride flush  3 mL Intravenous Q12H  . thiamine  100 mg Oral Daily   Or  . thiamine  100 mg Intravenous Daily   Infusions . heparin 1,100 Units/hr (07/05/15 1655)   Prn medicines acetaminophen **OR** acetaminophen, levalbuterol, LORazepam **OR** LORazepam, LORazepam, ondansetron **OR** ondansetron (ZOFRAN) IV   Recent Labs Lab 07/04/15 0427 07/05/15 0551 07/06/15 0615  NA 131* 130* 128*  K 4.3 3.8 3.9  CL 94* 92* 91*  CO2 17* 21* 20*  GLUCOSE 124* 143* 130*  BUN 111* 132* 158*  CREATININE 3.37* 3.90* 4.36*  CALCIUM 7.5* 7.7* 7.5*    Recent Labs Lab 07/04/15 0427 07/05/15 0551 07/06/15 0615  AST 647* 802* 695*  ALT 538* 695* 722*  ALKPHOS 116 119 119  BILITOT 0.9 0.9 1.1  PROT 6.4* 5.9* 5.6*  ALBUMIN 3.6 3.3* 3.2*     Recent Labs Lab 07/03/15 1236 07/04/15 0427 07/05/15 0551 07/06/15 0615  WBC 7.7 8.2 7.9 8.2  NEUTROABS 5.8  --   --   --   HGB 10.3* 12.0 10.9* 10.4*  HCT 30.4* 36.6 32.9* 30.7*  MCV 96.2 97.1 96.5 95.6  PLT 186 205 202 177   Assessment: 1. SOB/ cough/ hypoxemia - improved with diuresis. Very poor LV function (EF 20-25%), and new afib.  2. CKD4-> now CKD 5 (AKI on CKD) diuresis for CHF, very low EF, new AF - cardiorenal component. Bump in creat from baseline mid 2's.  BUN/Cr worse again today. Creatinine has increased 3.9->4.36, BUN 132->158 (steroids likely playing a role in the latter).  Lasix dose was reduced yesterday 120 TID ->80 BID.  Still with no gross uremic symptoms but at risk for requiring dialysis if develops more confusion or some other uremic complication.  Lungs sound much better and dyspnea mostly gone. Will hold lasix for 24 hours and reassess. Family very concerned. Told them don't have to make the decision yet. They say pt leans AWAY from HD.  3. CAD hx CABG with severe dilated / CM EF 20-25% 4. New onset afib/ aflutter - per primary  5. HTN meds 6. HL on alirocumab (Praluent) injections 7. ID - no fever 8. ^LFT's - etoh vs congestion. AST/ALT remain high 695/722 today - have not fallen to any great degree.    Camille Bal, MD [facility] [phone] Pager 07/06/2015, 3:23 PM

## 2015-07-06 NOTE — Progress Notes (Signed)
Patient: [name] / Admit Date: 07/03/2015 / Date of Encounter: 07/06/2015, 10:04 AM   Subjective: SOB a little better, still with raspy cough. Reports brisk UOP and normalized BM.   Objective: Telemetry: atrial fib rates controlled Physical Exam: Blood pressure 129/87, pulse 89, temperature 97.8 F (36.6 C), temperature source Oral, resp. rate 20, height 5\' 3"  (1.6 m), weight 173 lb 8 oz (78.699 kg), SpO2 100 %. General: Well developed, well nourished WF in no acute distress. Head: Normocephalic, atraumatic, sclera non-icteric, no xanthomas, nares are without discharge. Neck:  JVP not elevated. Lungs: Coarse BS with mild rhonchi, no wheezes or rales. Breathing is unlabored. Heart: RRR S1 S2 without murmurs, rubs, or gallops.  Abdomen: Soft, non-tender, non-distended with normoactive bowel sounds. No rebound/guarding. Extremities: No clubbing or cyanosis. No edema. Distal pedal pulses are 2+ and equal bilaterally. Neuro: Alert and oriented X 3. Moves all extremities spontaneously. Psych:  Responds to questions appropriately with a normal affect.   Intake/Output Summary (Last 24 hours) at 07/06/15 1004 Last data filed at 07/06/15 0646  Gross per 24 hour  Intake 1050.55 ml  Output   1401 ml  Net -350.45 ml    Inpatient Medications:  . aspirin EC  81 mg Oral Daily  . folic acid  1 mg Oral Daily  . furosemide  80 mg Intravenous Q12H  . levalbuterol  0.63 mg Nebulization BID  . methylPREDNISolone (SOLU-MEDROL) injection  60 mg Intravenous Q12H  . metoprolol  50 mg Oral BID  . multivitamin with minerals  1 tablet Oral Daily  . pantoprazole  40 mg Oral Daily  . sodium chloride flush  3 mL Intravenous Q12H  . thiamine  100 mg Oral Daily   Or  . thiamine  100 mg Intravenous Daily   Infusions:  . heparin 1,100 Units/hr (07/05/15 1655)    Labs:  Recent Labs  07/05/15 0551 07/06/15 0615  NA 130* 128*  K 3.8 3.9  CL 92* 91*  CO2 21* 20*  GLUCOSE 143* 130*  BUN  132* 158*  CREATININE 3.90* 4.36*  CALCIUM 7.7* 7.5*  MG  --  2.2    Recent Labs  07/05/15 0551 07/06/15 0615  AST 802* 695*  ALT 695* 722*  ALKPHOS 119 119  BILITOT 0.9 1.1  PROT 5.9* 5.6*  ALBUMIN 3.3* 3.2*    Recent Labs  07/03/15 1236  07/05/15 0551 07/06/15 0615  WBC 7.7  < > 7.9 8.2  NEUTROABS 5.8  --   --   --   HGB 10.3*  < > 10.9* 10.4*  HCT 30.4*  < > 32.9* 30.7*  MCV 96.2  < > 96.5 95.6  PLT 186  < > 202 177  < > = values in this interval not displayed.  Recent Labs  07/03/15 1236 07/03/15 1603 07/03/15 2247 07/04/15 0425 07/04/15 0952  CKTOTAL  --   --   --   --  318*  TROPONINI 1.09* 0.85* 0.54* 0.45*  --    Invalid input(s): POCBNP No results for input(s): HGBA1C in the last 72 hours.   Radiology/Studies:  Dg Chest 2 View  07/05/2015  CLINICAL DATA:  80 year old female with shortness of breath. EXAM: CHEST  2 VIEW COMPARISON:  07/03/2015 and prior exams FINDINGS: Cardiomegaly, CABG changes and mild pulmonary vascular congestion again noted. There is no evidence of focal airspace disease, pulmonary edema, suspicious pulmonary nodule/mass, pleural effusion, or pneumothorax. No acute bony abnormalities are identified. IMPRESSION: Cardiomegaly with mild pulmonary vascular  congestion. Electronically Signed   By: Harmon Pier M.D.   On: 07/05/2015 09:26   Dg Chest 2 View  07/03/2015  CLINICAL DATA:  Cough, congestion, shortness of breath, and weakness for 3 weeks. EXAM: CHEST  2 VIEW COMPARISON:  05/30/2006 FINDINGS: Mild enlargement of the cardiopericardial silhouette, with mild cephalization of blood flow but no overt edema. No Kerley B-lines observed. Prior CABG.  Atherosclerotic calcification of the aortic arch. Thoracic spondylosis.  No pleural effusion. IMPRESSION: 1. Stable mild enlargement of the cardiopericardial silhouette, with pulmonary venous hypertension but without overt edema. 2. Prior CABG. 3. Atherosclerotic aortic arch. Electronically Signed    By: Gaylyn Rong M.D.   On: 07/03/2015 12:06   US Abdomen Complete  07/04/2015  CLINICAL DATA:  80 year old female with transaminitis/ elevated LFTs. Patient with chronic renal disease. EXAM: ABDOMEN ULTRASOUND COMPLETE COMPARISON:  None. FINDINGS: Gallbladder: The gallbladder is unremarkable. There is no evidence of acute cholecystitis or cholelithiasis. Common bile duct: Diameter: 5 mm. There is no evidence of intrahepatic or extrahepatic biliary dilatation. Liver: Slightly increased and heterogeneous hepatic echotexture identified without focal lesions. IVC: No abnormality visualized. Pancreas: Visualized portion unremarkable. Spleen: Size and appearance within normal limits. Right Kidney: Length: 11.1 cm. Cortical thinning and increased renal echogenicity noted. There is no evidence of hydronephrosis or solid mass. Left Kidney: Length: 11.1 cm. Cortical thinning and increased renal echogenicity noted. There is no evidence of hydronephrosis or solid mass. Abdominal aorta: No aneurysm visualized. Other findings: None. IMPRESSION: Slightly increased and heterogeneous hepatic echotexture which is nonspecific but may represent hepatic steatosis. Cortical thinning and increased renal echogenicity compatible with medical renal disease. No other significant abnormalities. Electronically Signed   By: Harmon Pier M.D.   On: 07/04/2015 12:29     Assessment and Plan  80 y/o F with history of CAD s/p CABG 2007 (LIMA-LAD, SVG-dRCA, SVG-intermediate, SVG-diagonal, neg nuc 2014), EtOH abuse, PVD s/p right femoropopliteal bypass grafting, renal artery stenosis s/p L renal stent 11/2013 (residual RAS being followed for now), reported mild-mod carotid artery disease, CKD stage III, chronic-appearing anemia, former tobacco abuse admitted with fever, cough, acute kidney injury on CKD (BUN 93/Cr 3.64 - previously 2 range), newly recognized atrial fib (rates 90-110 on admission), marked transaminitis, lactic acidosis,  elevated BNP and initial troponin 1.09. Patient also initially reported 30 lb weight loss but actual weights in computer (beside stated weight) congruent with prior. 2D Echo 07/05/15: EF 20-25%, diffuse HK, akinesis of anteroseptal and inferolateral myocardium, mild AI, mod MR, severely dilated LA, mildly dilated RA, mod reduced RV function. (Prior EF by nuc in 2014 was 63%).  1. Cough/fever/SOB/wheezing possibly due to combination of PNA and acute systolic CHF - initially improved with steroids/abx, but also a component worrisome for acute systolic CHF in the setting of acute renal failure with new low EF. Diuretics being managed by renal. Not a candidate for ACEI/ARB/spiro with AKI. Etiology of LV dysfunction not clear - top ddx includes ischemia, r/t afib, alcohol abuse, or viral. See below.   2. AKI on CKD stage III - BUN/Cr continue to rise - up to 158/4.36. Followed by nephrology.  3. Elevated troponin - peak trop 1.09. Not presently a candidate for invasive evaluation with renal failure. She denies any recent angina. With ? EF by echo will need repeat ischemic eval once acute issues sort out. Dr. Jens Som discontinued Plavix. Continue ASA.  4. Newly recognized atrial fibrillation - TSH normal. Continue rate control. Not currently a candidate for DOAC  with renal dysfunction. With EtOH abuse she may not be a good long-term anticoagulation candidate. Per Dr. Ludwig Clarks note, continue heparin per pharmacy and transition to Coumadin later pending renal function with plan for DCCV as outpt if atrial fibrillation persists. Afib could also be related to acute illness so as she recovers could treat with short term anticoagulation and then proceed with DCCV to see if she holds sinus and then DC anticoagulation 4 weeks later.  5. EtOH abuse - counseled regarding cessation. On CIWA.   6. Acute transaminitis - felt possibly related to alcohol abuse, but levels persist. Abd [place] increased heterogeneous hepatic  echotexture nonspecific but may represent hepatic steatosis. Acute hep panel neg. Per IM.  Signed, Ronie Spies PA-C Pager: [phone]   I have seen, examined and evaluated the patient this PM along with Mrs. Dunn, PA-C.  After reviewing all the available data and chart,  I agree with her findings, examination as well as impression recommendations.  Very confusing presentation - it sounds as thought he initial presenting symptoms complex is more consistent with PNA -- this is complicated by new Dx of Afib (now rate controlled on BB) & significantly reduced LVEF. Study Conclusions  - Left ventricle: The cavity size was normal. There was mild focal basal hypertrophy of the septum. Systolic function was severely reduced. The estimated ejection fraction was in the range of 20% to 25%. Diffuse hypokinesis. There is akinesis of the anteroseptal and inferolateral myocardium. - Aortic valve: There was mild regurgitation. - Mitral valve: Calcified annulus. There was moderate regurgitation. - Left atrium: The atrium was severely dilated. - Right ventricle: Systolic function was moderately reduced. - Right atrium: The atrium was mildly dilated.  Impressions:  - Akinesis of the anteroseptal and inferior lateral walls; overall severely reduced LV function; mild AI; severe LAE; moderate MR; mild RAE; moderately reduced RV function.  She certainly does not appear to be volume overloaded with combined CHF - but has been aggressively diuresed with Nephrology guidance.  Currently diuretics are on hold --> prior to restarting, would consider RHC to determine volume status.  Afib is rate controlled on Lopressor => would prefer Toprol for d/c given reduced EF.  Her troponin levels were increased - but not to the extent that explains the drop in EF -- will definitely need ischemic evaluation - but not with current level of Renal Failure.  Agree that DOACs are probably not a good option with  renal failure, but LFTs are also quite elevated making warfarin a concerning option as well.  May end up doing ASA/Plavix for coverage until able to treat with full [facility] - for now continue with ASA only until renal/hepatic issues are sorted out.  Will follow.   Marykay Lex, M.D., M.S. Interventional Cardiologist   Pager # [phone] Phone # [phone] [address] [place], [place] [address]

## 2015-07-06 NOTE — Progress Notes (Signed)
[facility] PROGRESS NOTE  [name] GNF:[id number] DOB: 1936-05-05 DOA: 07/03/2015 PCP: Michiel Sites, MD  Assessment/Plan: #1 cough/shortness of breath Chest x-ray negative for any infiltrates. Repeat chest x-ray today with some vascular congestion. Patient noted to have some wheezing on admission however that has seemed to have improved. Influenza PCR is negative. Discontinued vancomycin and IV cefepime. Continue steroid taper, nebulizer.  #2 acute on chronic kidney disease stage IV/Now CKD V Unknown baseline. Renal function worsening with diuresis however some clinical improvement. Abdominal ultrasound showing chronic medical renal disease. Urinalysis is negative. Nephrology holding Lasix today. Patient could be leaning towards hemodialysis soon. Nephrology following.  #3 transaminitis Differential includes hepatic steatosis from alcohol use as noted on abdominal ultrasound versus secondary to drug-induced secondary to patient's home medication of alirocumab. Patient currently asymptomatic. LFTs have trended down since admission however no significant change over the past 24 hours. Will have patient discontinue alirocumab. Acute hepatitis panel negative. Discuss case with Dr. Loreta Ave, gastroenterology who recommended discontinuing lipid medication with outpatient follow-up in 2 weeks for further evaluation. Follow.   #4 new onset atrial fibrillation with RVR/cardiomyopathy   2-D echo with severely reduced systolic function with EF of 20-25% with diffuse hypokinesis and akinesis of the anteroseptal and inferolateral myocardium. Severe left atrial enlargement, moderate MR, mild AI, mild RAE, moderately reduced right ventricular function. Continue beta blocker for rate control. Patient on IV heparin for anticoagulation. Unable to place patient on ACE inhibitor secondary to worsening renal function. Alcohol cessation. Per cardiology. Per cardiology patient may need anticoagulation  prior to DCCV 2 mL both sinus and then discontinuing anticoagulation 4 weeks later.  #5 elevated troponin /NSTEMI Patient asymptomatic. Patient currently on IV heparin. 2-D echo with a severely reduced systolic function with EF of 20-25% with diffuse hypokinesis and akinesis of the anteroseptal and inferolateral myocardium. Patient likely may need ischemic evaluation. Continue beta blocker, aspirin. Unable to place on ACE inhibitor secondary to worsening renal function. Per cardiology patient for outpatient nuclear study. Per cardiology.  #6 history of coronary artery disease/Cardiomyopathy 2-D echo with a severely depressed EF of 20-25% with diffuse hypokinesis and akinesis of the anteroseptal and inferolateral myocardium. Continue beta blocker, aspirin. Unable to place on ACE inhibitor secondary to worsening renal function. Alcohol cessation.  #7 alcohol dependence Patient minimizing alcohol use. Continue the Ativan withdrawal protocol.  #8 prophylaxis Heparin for DVT prophylaxis.   Code Status: Full Family Communication: Updated patient and family at bedside. Disposition Plan: Remain on telemetry.   Consultants:  Nephrology: Dr.Schertz 07/03/2015  Cardiology: Dr. Allyson Sabal 07/03/2015   Procedures:  2-D echo 07/05/2015  chest x-ray 07/03/2015, 07/05/2015  Abdominal ultrasound 07/04/2015  Antibiotics:  IV cefepime 07/04/2015>>>>> 07/05/2015   IV vancomycin 07/03/2015>>>>> 07/05/2015  HPI/Subjective: Patient denies any chest pain. Patient denies any shortness of breath.   Objective: Filed Vitals:   07/06/15 1030 07/06/15 1420  BP:  144/92  Pulse:  106  Temp: 97.4 F (36.3 C) 97.3 F (36.3 C)  Resp:  20    Intake/Output Summary (Last 24 hours) at 07/06/15 1835 Last data filed at 07/06/15 1830  Gross per 24 hour  Intake 618.55 ml  Output   1001 ml  Net -382.45 ml   Filed Weights   07/04/15 0814 07/05/15 0406 07/06/15 0456  Weight: 78.064 kg (172 lb 1.6 oz) 78.3  kg (172 lb 9.9 oz) 78.699 kg (173 lb 8 oz)    Exam:   General:  NAD  Cardiovascular: irregularly irregular  Respiratory: CTAB  Abdomen: soft, nontender, nondistended, positive bowel sounds.   Musculoskeletal:  no clubbing cyanosis or edema.   Data Reviewed: Basic Metabolic Panel:  Recent Labs Lab 07/03/15 1236 07/04/15 0427 07/05/15 0551 07/06/15 0615  NA 135 131* 130* 128*  K 4.6 4.3 3.8 3.9  CL 96* 94* 92* 91*  CO2 18* 17* 21* 20*  GLUCOSE 93 124* 143* 130*  BUN 93* 111* 132* 158*  CREATININE 3.64* 3.37* 3.90* 4.36*  CALCIUM 8.0* 7.5* 7.7* 7.5*  MG  --   --   --  2.2   Liver Function Tests:  Recent Labs Lab 07/03/15 1236 07/04/15 0427 07/05/15 0551 07/06/15 0615  AST 1408* 647* 802* 695*  ALT 679* 538* 695* 722*  ALKPHOS 132* 116 119 119  BILITOT 1.2 0.9 0.9 1.1  PROT 6.6 6.4* 5.9* 5.6*  ALBUMIN 3.9 3.6 3.3* 3.2*   No results for input(s): LIPASE, AMYLASE in the last 168 hours. No results for input(s): AMMONIA in the last 168 hours. CBC:  Recent Labs Lab 07/03/15 1236 07/04/15 0427 07/05/15 0551 07/06/15 0615  WBC 7.7 8.2 7.9 8.2  NEUTROABS 5.8  --   --   --   HGB 10.3* 12.0 10.9* 10.4*  HCT 30.4* 36.6 32.9* 30.7*  MCV 96.2 97.1 96.5 95.6  PLT 186 205 202 177   Cardiac Enzymes:  Recent Labs Lab 07/03/15 1236 07/03/15 1603 07/03/15 2247 07/04/15 0425 07/04/15 0952  CKTOTAL  --   --   --   --  318*  TROPONINI 1.09* 0.85* 0.54* 0.45*  --    BNP (last 3 results)  Recent Labs  07/03/15 1236  BNP 4223.3*    ProBNP (last 3 results) No results for input(s): PROBNP in the last 8760 hours.  CBG: No results for input(s): GLUCAP in the last 168 hours.  Recent Results (from the past 240 hour(s))  Culture, blood (routine x 2)     Status: None (Preliminary result)   Collection Time: 07/03/15 12:32 PM  Result Value Ref Range Status   Specimen Description BLOOD RIGHT ANTECUBITAL  Final   Special Requests BOTTLES DRAWN AEROBIC AND  ANAEROBIC 5CC  Final   Culture   Final    NO GROWTH 3 DAYS Performed at [facility]    Report Status PENDING  Incomplete  Culture, blood (routine x 2)     Status: None (Preliminary result)   Collection Time: 07/03/15 12:35 PM  Result Value Ref Range Status   Specimen Description BLOOD LEFT ANTECUBITAL  Final   Special Requests BOTTLES DRAWN AEROBIC AND ANAEROBIC 5CC  Final   Culture   Final    NO GROWTH 3 DAYS Performed at [facility]    Report Status PENDING  Incomplete     Studies: Dg Chest 2 View  07/05/2015  CLINICAL DATA:  80 year old female with shortness of breath. EXAM: CHEST  2 VIEW COMPARISON:  07/03/2015 and prior exams FINDINGS: Cardiomegaly, CABG changes and mild pulmonary vascular congestion again noted. There is no evidence of focal airspace disease, pulmonary edema, suspicious pulmonary nodule/mass, pleural effusion, or pneumothorax. No acute bony abnormalities are identified. IMPRESSION: Cardiomegaly with mild pulmonary vascular congestion. Electronically Signed   By: Harmon Pier M.D.   On: 07/05/2015 09:26    Scheduled Meds: . aspirin EC  81 mg Oral Daily  . folic acid  1 mg Oral Daily  . levalbuterol  0.63 mg Nebulization BID  . methylPREDNISolone (SOLU-MEDROL) injection  60 mg Intravenous Q12H  . metoprolol  50 mg Oral BID  . multivitamin with minerals  1 tablet Oral Daily  . pantoprazole  40 mg Oral Daily  . sodium chloride flush  3 mL Intravenous Q12H  . thiamine  100 mg Oral Daily   Or  . thiamine  100 mg Intravenous Daily   Continuous Infusions: . heparin 1,100 Units/hr (07/06/15 1739)    Active Problems:   CAD in native artery - CABG x4 (LIMA-LAD, SVG-distal RCA, SVG-diagonal, SVG-[place]   Hypertension, essential   Chronic renal insufficiency, stage III (moderate)   Acute on chronic renal failure (HCC)   Dyspnea   Transaminitis   Atrial fibrillation (HCC)   Elevated troponin   Unintentional weight loss   Lactic acidosis    Sepsis (HCC)   Community acquired pneumonia   Shortness of breath   NSTEMI (non-ST elevated myocardial infarction) (HCC)    Time spent: 40 minutes    Thurmon Mizell M.D. [facility] Pager [phone]. If 7PM-7AM, please contact night-coverage at [email or web address], password [facility] 07/06/2015, 6:35 PM  LOS: 3 days

## 2015-07-06 NOTE — Evaluation (Signed)
Occupational Therapy Evaluation Patient Details Name: [name] MRN: [id number] DOB: [birth year] Today's Date: 07/06/2015    History of Present Illness 80 yo female admitted with acute on chronic renal failure, Afib. Hx of ETOH abuse   Clinical Impression   Pt admitted with renal failure. Pt currently with functional limitations due to the deficits listed below (see OT Problem List).  Pt will benefit from skilled OT to increase their safety and independence with ADL and functional mobility for ADL to facilitate discharge to venue listed below.      Follow Up Recommendations  Home health OT;Supervision/Assistance - 24 hour    Equipment Recommendations  None recommended by OT       Precautions / Restrictions Precautions Precautions: Fall Precaution Comments: monitor sats, HR Restrictions Weight Bearing Restrictions: No      Mobility Bed Mobility Overal bed mobility: Needs Assistance Bed Mobility: Supine to Sit     Supine to sit: Mod assist     General bed mobility comments: close guard for safety. Increased time. Mildly effortful for pt. vcs technique. bedrail used.   Transfers Overall transfer level: Needs assistance Equipment used: 1 person hand held assist Transfers: Sit to/from [employer] Sit to Stand: Mod assist Stand pivot transfers: Mod assist       General transfer comment: assist to rise, stabilize, control descent. vcs safety, hand placement. unsteady.         ADL Overall ADL's : Needs assistance/impaired Eating/Feeding: Set up;Sitting   Grooming: Minimal assistance;Sitting   Upper Body Bathing: Minimal assitance;Sitting   Lower Body Bathing: Moderate assistance;Sit to/from stand   Upper Body Dressing : Minimal assistance;Sitting   Lower Body Dressing: Moderate assistance;Sit to/from stand   Toilet Transfer: Minimal assistance;BSC;Cueing for safety;Cueing for sequencing   Toileting- Clothing Manipulation and  Hygiene: Minimal assistance;Sit to/from stand;Cueing for safety                         Pertinent Vitals/Pain Pain Assessment: No/denies pain        Extremity/Trunk Assessment Upper Extremity Assessment Upper Extremity Assessment: Generalized weakness           Communication Communication Communication: No difficulties   Cognition Arousal/Alertness: Awake/alert Behavior During Therapy: WFL for tasks assessed/performed Overall Cognitive Status: Within Functional Limits for tasks assessed                                Home Living Family/patient expects to be discharged to:: Private residence Living Arrangements: Spouse/significant other Available Help at Discharge: Family Type of Home:  (condo) Home Access: Stairs to enter;Other (comment) Entrance Stairs-Number of Steps: 2   Home Layout: Two level;Able to live on main level with bedroom/bathroom               Home Equipment: [employer] Mor - single point          Prior Functioning/Environment Level of Independence: Independent             OT Diagnosis: Generalized weakness   OT Problem List: Decreased strength;Decreased safety awareness   OT Treatment/Interventions: Self-care/ADL training;DME and/or AE instruction;Patient/family education    OT Goals(Current goals can be found in the care plan section) Acute Rehab OT Goals Patient Stated Goal: not have this oxygen OT Goal Formulation: With patient Time For Goal Achievement: 07/20/15 Potential to Achieve Goals: Good  OT Frequency: Min 2X/week   Barriers to D/C:  End of Session Nurse Communication: Mobility status  Activity Tolerance: Patient tolerated treatment well Patient left: in chair;with chair alarm set;with family/visitor present   Time: 4098-1191 OT Time Calculation (min): 24 min Charges:  OT General Charges $OT Visit: 1 Procedure OT Evaluation $OT Eval Low Complexity: 1 Procedure OT Treatments $Self  Care/Home Management : 8-22 mins G-Codes:    Einar Crow D 07-19-15, 5:15 PM

## 2015-07-06 NOTE — Care Management Important Message (Signed)
Important Message  Patient Details  Name: ANEESA BORKOWSKI MRN: [id number] Date of Birth: 09/17/1935   Medicare Important Message Given:  Yes    Haskell Flirt 07/06/2015, 2:47 PMImportant Message  Patient Details  Name: BEEDIE KRANZ MRN: [id number] Date of Birth: 1936/02/02   Medicare Important Message Given:  Yes    Haskell Flirt 07/06/2015, 2:47 PM

## 2015-07-06 NOTE — Progress Notes (Signed)
ANTICOAGULATION CONSULT NOTE - F/u Consult  Pharmacy Consult for heparin Indication: chest pain/ACS and atrial fibrillation  Allergies  Allergen Reactions  . Angiotensin Receptor Blockers   . Latex Swelling  . Statins Other (See Comments)    Diffuse cramping; has tried Lipitor, Crestor, Pravachol and simvastatin  . Benicar [Olmesartan] Itching and Rash    Patient Measurements: Height: 5\' 3"  (160 cm) Weight: 173 lb 8 oz (78.699 kg) IBW/kg (Calculated) : 52.4 Heparin Dosing Weight: 63 kg  Vital Signs: Temp: 97.4 F (36.3 C) (02/13 1030) Temp Source: Oral (02/13 1030) BP: 129/87 mmHg (02/13 0900) Pulse Rate: 89 (02/13 0456)  Labs:  Recent Labs  07/03/15 1236 07/03/15 1603 07/03/15 2247  07/04/15 0425 07/04/15 0427 07/04/15 0952  07/05/15 0551 07/05/15 0946 07/05/15 1805 07/06/15 0615  HGB 10.3*  --   --   --   --  12.0  --   --  10.9*  --   --  10.4*  HCT 30.4*  --   --   --   --  36.6  --   --  32.9*  --   --  30.7*  PLT 186  --   --   --   --  205  --   --  202  --   --  177  LABPROT 16.8*  --   --   --   --  17.9*  --   --   --   --   --   --   INR 1.41  --   --   --   --  1.53*  --   --   --   --   --   --   HEPARINUNFRC  --   --   --   < >  --   --   --   < >  --  0.47 0.55 0.57  CREATININE 3.64*  --   --   --   --  3.37*  --   --  3.90*  --   --  4.36*  CKTOTAL  --   --   --   --   --   --  318*  --   --   --   --   --   TROPONINI 1.09* 0.85* 0.54*  --  0.45*  --   --   --   --   --   --   --   < > = values in this interval not displayed.  Estimated Creatinine Clearance: 10.7 mL/min (by C-G formula based on Cr of 4.36).   Medical History: Past Medical History  Diagnosis Date  . CAD in native artery 2007    a. S/p CABG 2007 (LIMA-LAD, SVG-dRCA, SVG-intermediate, SVG-diagonal). b. Nuclear stress test 08/2012: normal, low risk.  . S/P CABG x 4 2007    LIMA-LAD, SVG-Distal RCA, SVG-Ramus, SVG-Diagonal; no echocardiogram done  . PAD (peripheral artery  disease) (HCC) 1997    Mild-moderate carotid disease; status post right SFA occlusion with Fem-Below Knee Pop bypass (Dr. Hart [place]); LEA Dopplers 07/2014: RIGHT - ABI 0.84, CIA/EIA 50-69%, CFA/PFA patent Fem-Pop bypass patent w/ 70-99% anastomotic stenosis, patent Pop A with 3 V runoff; LABI 0.85- L CIA/EIA patent, LPFA 70-99%, dLSFA 50-69%, patent LPopA & 3 V runoff.  . Renovascular hypertension 12/17/2013    a. s/p L RA Stent 11/2013. b. Renal duplex 02/2015: >60% right proximal renal artery stenosis, normal left renal artery s/p stent, f/u 1 yr  recommended.  . CKD (chronic kidney disease), stage III   . Essential hypertension   . Hyperlipidemia LDL goal <70   . Carotid artery disease (HCC)     a. Mild-mod carotid disease per Dr.Berry's note.  . Anemia     Assessment: Patient's a 80 y.o F presented to the ED on 2/10 with c/o SOB.  Troponin was found to be elevated in the ED and with new onset afib.  To start heparin for Afib and r/o ACS.  Patient reported no recent bleeding events or surgical procedure.  Today, 07/06/2015:  HL therapeutic and stable at 1100 units/hr  CBC stable, Plt sl lower but wnl No bleeding/complications reported.  Plavix stopped by MD, continues on ASA 81 mg  Goal of Therapy:  Heparin level 0.3-0.7 units/ml Monitor platelets by anticoagulation protocol: Yes   Plan:   Continue heparin infusion at 1100 units/hr  Daily CBC and HL while on heparin infusion  F/u plans for transition to PO anticoag. Per cards, poor long-term candidate for [facility].   Bernadene Person, PharmD, BCPS Pager: [phone] 07/06/2015, 11:24 AM

## 2015-07-06 NOTE — Clinical Documentation Improvement (Signed)
Internal Medicine  Can the diagnosis of hypoxia be further specified?        Respiratory Failure  Document Acuity - Acute, Chronic, Acute on Chronic  Document Inclusion Of - Hypoxia, Hypercapnia, Combination of Both  Other  Clinically Undetermined  Document any associated diagnoses/conditions. Please update your documentation within the medical record to reflect your response to this query. Thank you.  Supporting Information:(As per notes) Sepsis and Pneumonia, possible NSTEMI, "Workup in the ER reveals diffuse wheezing and hypoxia with O2 sat 88%"  "Chest diffuse bilat coarse exp wheezing and insp rales bilat bases 2/3 on L and 1/3 on R"  Oxygen therapy:  Non-rebreather mask   Please exercise your independent, professional judgment when responding. A specific answer is not anticipated or expected.  Thank You, Nevin Bloodgood, RN, BSN, CCDS,Clinical Documentation Specialist:  [phone]  [phone]=[facility]- Health Information Management

## 2015-07-07 ENCOUNTER — Inpatient Hospital Stay (HOSPITAL_COMMUNITY): Payer: Medicare Other

## 2015-07-07 ENCOUNTER — Encounter (HOSPITAL_COMMUNITY): Payer: Self-pay | Admitting: General Surgery

## 2015-07-07 DIAGNOSIS — R0600 Dyspnea, unspecified: Secondary | ICD-10-CM

## 2015-07-07 DIAGNOSIS — N19 Unspecified kidney failure: Secondary | ICD-10-CM

## 2015-07-07 DIAGNOSIS — A419 Sepsis, unspecified organism: Principal | ICD-10-CM

## 2015-07-07 DIAGNOSIS — T68XXXA Hypothermia, initial encounter: Secondary | ICD-10-CM

## 2015-07-07 DIAGNOSIS — I1310 Hypertensive heart and chronic kidney disease without heart failure, with stage 1 through stage 4 chronic kidney disease, or unspecified chronic kidney disease: Secondary | ICD-10-CM | POA: Diagnosis present

## 2015-07-07 LAB — CBC
HCT: 33.1 % — ABNORMAL LOW (ref 36.0–46.0)
Hemoglobin: 11 g/dL — ABNORMAL LOW (ref 12.0–15.0)
MCH: 31.7 pg (ref 26.0–34.0)
MCHC: 33.2 g/dL (ref 30.0–36.0)
MCV: 95.4 fL (ref 78.0–100.0)
PLATELETS: 202 10*3/uL (ref 150–400)
RBC: 3.47 MIL/uL — AB (ref 3.87–5.11)
RDW: 13.1 % (ref 11.5–15.5)
WBC: 8.7 10*3/uL (ref 4.0–10.5)

## 2015-07-07 LAB — PROCALCITONIN: PROCALCITONIN: 3.79 ng/mL

## 2015-07-07 LAB — COMPREHENSIVE METABOLIC PANEL
ALK PHOS: 126 U/L (ref 38–126)
ALT: 815 U/L — ABNORMAL HIGH (ref 14–54)
ANION GAP: 18 — AB (ref 5–15)
AST: 612 U/L — ABNORMAL HIGH (ref 15–41)
Albumin: 3.7 g/dL (ref 3.5–5.0)
BILIRUBIN TOTAL: 0.8 mg/dL (ref 0.3–1.2)
BUN: 158 mg/dL — ABNORMAL HIGH (ref 6–20)
CALCIUM: 7.9 mg/dL — AB (ref 8.9–10.3)
CO2: 21 mmol/L — ABNORMAL LOW (ref 22–32)
Chloride: 91 mmol/L — ABNORMAL LOW (ref 101–111)
Creatinine, Ser: 4.65 mg/dL — ABNORMAL HIGH (ref 0.44–1.00)
GFR calc non Af Amer: 8 mL/min — ABNORMAL LOW (ref >60)
GFR, EST AFRICAN AMERICAN: 10 mL/min — AB (ref >60)
Glucose, Bld: 141 mg/dL — ABNORMAL HIGH (ref 65–99)
Potassium: 4.2 mmol/L (ref 3.5–5.1)
SODIUM: 130 mmol/L — AB (ref 135–145)
TOTAL PROTEIN: 6.3 g/dL — AB (ref 6.5–8.1)

## 2015-07-07 LAB — LACTIC ACID, PLASMA
LACTIC ACID, VENOUS: 1.5 mmol/L (ref 0.5–2.0)
LACTIC ACID, VENOUS: 1 mmol/L (ref 0.5–2.0)

## 2015-07-07 LAB — HEPARIN LEVEL (UNFRACTIONATED)
Heparin Unfractionated: 1.05 IU/mL — ABNORMAL HIGH (ref 0.30–0.70)
Heparin Unfractionated: 0.41 IU/mL (ref 0.30–0.70)

## 2015-07-07 LAB — PHOSPHORUS: PHOSPHORUS: 8.5 mg/dL — AB (ref 2.5–4.6)

## 2015-07-07 LAB — TSH: TSH: 0.922 u[IU]/mL (ref 0.350–4.500)

## 2015-07-07 MED ORDER — IOHEXOL 300 MG/ML  SOLN
25.0000 mL | INTRAMUSCULAR | Status: AC
  Administered 2015-07-07 (×2): 25 mL via ORAL

## 2015-07-07 MED ORDER — HEPARIN (PORCINE) IN NACL 100-0.45 UNIT/ML-% IJ SOLN
700.0000 [IU]/h | INTRAMUSCULAR | Status: DC
  Administered 2015-07-08: 700 [IU]/h via INTRAVENOUS
  Filled 2015-07-07 (×2): qty 250

## 2015-07-07 MED ORDER — DIPHENHYDRAMINE HCL 25 MG PO CAPS
50.0000 mg | ORAL_CAPSULE | Freq: Once | ORAL | Status: DC
  Filled 2015-07-07: qty 1

## 2015-07-07 MED ORDER — FUROSEMIDE 10 MG/ML IJ SOLN
80.0000 mg | Freq: Two times a day (BID) | INTRAMUSCULAR | Status: DC
  Administered 2015-07-07 – 2015-07-13 (×10): 80 mg via INTRAVENOUS
  Filled 2015-07-07 (×13): qty 8

## 2015-07-07 NOTE — Progress Notes (Signed)
PT Cancellation Note  Patient Details Name: [name] MRN: [id number] DOB: 1936-04-19   Cancelled Treatment:    Reason Eval/Treat Not Completed: Medical issues which prohibited therapy. Will hold PT and check back back another day.    Rebeca Alert, MPT Pager: [phone]

## 2015-07-07 NOTE — Progress Notes (Signed)
[facility] PROGRESS NOTE  [name] ZOX:[id number] DOB: 12-21-1935 DOA: 07/03/2015 PCP: Michiel Sites, MD  Brief interval history [name] is a 80 y.o. female with a past medical history of coronary artery disease, peripheral vascular disease, chronic kidney disease stage III, who was in her usual state of health until sometime in December when she started developing a cough which was dry. She had difficulty breathing. She was seen by her provider who prescribed an antibiotic regimen. Chest x-ray did not show any pneumonia. She took these medications for 10 days. Did not feel any better. And then about 4 days ago she went back to see her primary care physician. This time she was experiencing greenish expectoration with cough. She had a fever up to 102F. She was wheezing. She was given a steroid injection. She was prescribed Ceftin.  In the emergency department evaluation revealed elevated BNP and troponin level. New-onset atrial fibrillation with rapid ventricular response. Lactic acid was also elevated. WBC was normal. She was noted to have acute on chronic renal failure versus progressive renal disease. Patient also noted to have a transaminitis. Acute hepatitis panel was negative. Abdominal ultrasound with hepatic steatosis. LFTs started to trend down since admission however over the past 24-48 hours no significant change. Patient was on a lipid lowering medication which is currently on hold. Case discussed with Dr. Loreta Ave who recommended outpatient follow-up. Repeat chest x-ray showed Claritin as such IV antibiotics were discontinued. On the morning of 07/07/2015 patient noted to be hypothermic and subsequently transferred to the stepdown unit for bear hugger and labs obtained.    Assessment/Plan: #1 Hypothermia Questionable etiology. Patient denies any chest pain, no shortness of breath, no diarrhea. Patient currently asymptomatic. Temperature has been checked 4-5  times with different thermometers and patient still hypothermic although clinically appears okay. Check a TSH, cortisol level, acute abdominal series, blood cultures 2, lactic acid level, pro-calcitonin. Patient's CBC this morning with a normal white count. Place on the bear hugger.  #2 cough/shortness of breath Chest x-ray negative for any infiltrates. Repeat chest x-ray today with some vascular congestion. Patient noted to have some wheezing on admission however that has seemed to have improved. Influenza PCR is negative. Discontinued vancomycin and IV cefepime. Continue steroid taper, nebulizer. Add Lawyer.  #3 acute on chronic kidney disease stage IV/Now CKD V Unknown baseline. Renal function worsening with diuresis however some clinical improvement. Abdominal ultrasound showing chronic medical renal disease. Urinalysis is negative. Nephrology holding Lasix. Patient could be leaning towards hemodialysis soon. Nephrology following.  #4 transaminitis Differential includes hepatic steatosis from alcohol use as noted on abdominal ultrasound versus secondary to drug-induced secondary to patient's home medication of alirocumab. Patient currently asymptomatic. LFTs have trended down since admission however no significant change over the past 24 hours. Will have patient discontinue alirocumab. Acute hepatitis panel negative. Discuss case with Dr. Loreta Ave, gastroenterology who recommended discontinuing lipid medication with outpatient follow-up in 2 weeks for further evaluation. Follow.   #5 new onset atrial fibrillation with RVR/cardiomyopathy  Currently rate controlled on Lopressor. 2-D echo with severely reduced systolic function with EF of 20-25% with diffuse hypokinesis and akinesis of the anteroseptal and inferolateral myocardium. Severe left atrial enlargement, moderate MR, mild AI, mild RAE, moderately reduced right ventricular function. Continue beta blocker for rate control. Patient on IV  heparin for anticoagulation. Unable to place patient on ACE inhibitor secondary to worsening renal function. Alcohol cessation. Per cardiology. Per cardiology patient may need anticoagulation prior to  DCCV to see if she remains in sinus and then discontinuing anticoagulation 4 weeks later.  #6 elevated troponin /NSTEMI Patient asymptomatic. Patient currently on IV heparin. 2-D echo with a severely reduced systolic function with EF of 20-25% with diffuse hypokinesis and akinesis of the anteroseptal and inferolateral myocardium. Patient likely may need ischemic evaluation. Continue beta blocker, aspirin. Unable to place on ACE inhibitor secondary to worsening renal function. Per cardiology patient for outpatient nuclear study. Per cardiology.  #7 history of coronary artery disease/Cardiomyopathy 2-D echo with a severely depressed EF of 20-25% with diffuse hypokinesis and akinesis of the anteroseptal and inferolateral myocardium. Continue beta blocker, aspirin. Unable to place on ACE inhibitor secondary to worsening renal function. Alcohol cessation.  #8 alcohol dependence Patient minimizing alcohol use. Continue the Ativan withdrawal protocol.  #9 prophylaxis Heparin for DVT prophylaxis.   Code Status: Full Family Communication: Updated patient and family at bedside. Disposition Plan: Transfer to stepdown unit.   Consultants:  Nephrology: Dr.Schertz 07/03/2015  Cardiology: Dr. Allyson Sabal 07/03/2015   Procedures:  2-D echo 07/05/2015  chest x-ray 07/03/2015, 07/05/2015  Abdominal ultrasound 07/04/2015  Antibiotics:  IV cefepime 07/04/2015>>>>> 07/05/2015   IV vancomycin 07/03/2015>>>>> 07/05/2015  HPI/Subjective: Patient sitting up eating breakfast. No chest pain. No shortness of breath. Patient states had a bowel movement this morning which was normal. Patient with hypothermia this morning per nursing.  Objective: Filed Vitals:   07/07/15 0658 07/07/15 0745  BP:    Pulse:     Temp: 94.1 F (34.5 C) 96.7 F (35.9 C)  Resp:      Intake/Output Summary (Last 24 hours) at 07/07/15 0811 Last data filed at 07/07/15 0748  Gross per 24 hour  Intake    208 ml  Output    700 ml  Net   -492 ml   Filed Weights   07/05/15 0406 07/06/15 0456 07/07/15 0543  Weight: 78.3 kg (172 lb 9.9 oz) 78.699 kg (173 lb 8 oz) 78.019 kg (172 lb)    Exam:   General:  NAD  Cardiovascular: irregularly irregular  Respiratory: CTAB  Abdomen: soft, nontender, nondistended, positive bowel sounds.   Musculoskeletal:  no clubbing cyanosis or edema.   Data Reviewed: Basic Metabolic Panel:  Recent Labs Lab 07/03/15 1236 07/04/15 0427 07/05/15 0551 07/06/15 0615 07/07/15 0547  NA 135 131* 130* 128* 130*  K 4.6 4.3 3.8 3.9 4.2  CL 96* 94* 92* 91* 91*  CO2 18* 17* 21* 20* 21*  GLUCOSE 93 124* 143* 130* 141*  BUN 93* 111* 132* 158* 158*  CREATININE 3.64* 3.37* 3.90* 4.36* 4.65*  CALCIUM 8.0* 7.5* 7.7* 7.5* 7.9*  MG  --   --   --  2.2  --   PHOS  --   --   --   --  8.5*   Liver Function Tests:  Recent Labs Lab 07/03/15 1236 07/04/15 0427 07/05/15 0551 07/06/15 0615 07/07/15 0547  AST 1408* 647* 802* 695* 612*  ALT 679* 538* 695* 722* 815*  ALKPHOS 132* 116 119 119 126  BILITOT 1.2 0.9 0.9 1.1 0.8  PROT 6.6 6.4* 5.9* 5.6* 6.3*  ALBUMIN 3.9 3.6 3.3* 3.2* 3.7   No results for input(s): LIPASE, AMYLASE in the last 168 hours. No results for input(s): AMMONIA in the last 168 hours. CBC:  Recent Labs Lab 07/03/15 1236 07/04/15 0427 07/05/15 0551 07/06/15 0615 07/07/15 0547  WBC 7.7 8.2 7.9 8.2 8.7  NEUTROABS 5.8  --   --   --   --  HGB 10.3* 12.0 10.9* 10.4* 11.0*  HCT 30.4* 36.6 32.9* 30.7* 33.1*  MCV 96.2 97.1 96.5 95.6 95.4  PLT 186 205 202 177 202   Cardiac Enzymes:  Recent Labs Lab 07/03/15 1236 07/03/15 1603 07/03/15 2247 07/04/15 0425 07/04/15 0952  CKTOTAL  --   --   --   --  318*  TROPONINI 1.09* 0.85* 0.54* 0.45*  --    BNP (last 3  results)  Recent Labs  07/03/15 1236  BNP 4223.3*    ProBNP (last 3 results) No results for input(s): PROBNP in the last 8760 hours.  CBG: No results for input(s): GLUCAP in the last 168 hours.  Recent Results (from the past 240 hour(s))  Culture, blood (routine x 2)     Status: None (Preliminary result)   Collection Time: 07/03/15 12:32 PM  Result Value Ref Range Status   Specimen Description BLOOD RIGHT ANTECUBITAL  Final   Special Requests BOTTLES DRAWN AEROBIC AND ANAEROBIC 5CC  Final   Culture   Final    NO GROWTH 3 DAYS Performed at [facility]    Report Status PENDING  Incomplete  Culture, blood (routine x 2)     Status: None (Preliminary result)   Collection Time: 07/03/15 12:35 PM  Result Value Ref Range Status   Specimen Description BLOOD LEFT ANTECUBITAL  Final   Special Requests BOTTLES DRAWN AEROBIC AND ANAEROBIC 5CC  Final   Culture   Final    NO GROWTH 3 DAYS Performed at [facility]    Report Status PENDING  Incomplete     Studies: Dg Chest 2 View  07/05/2015  CLINICAL DATA:  80 year old female with shortness of breath. EXAM: CHEST  2 VIEW COMPARISON:  07/03/2015 and prior exams FINDINGS: Cardiomegaly, CABG changes and mild pulmonary vascular congestion again noted. There is no evidence of focal airspace disease, pulmonary edema, suspicious pulmonary nodule/mass, pleural effusion, or pneumothorax. No acute bony abnormalities are identified. IMPRESSION: Cardiomegaly with mild pulmonary vascular congestion. Electronically Signed   By: Harmon Pier M.D.   On: 07/05/2015 09:26    Scheduled Meds: . aspirin EC  81 mg Oral Daily  . diphenhydrAMINE  50 mg Oral Once  . folic acid  1 mg Oral Daily  . levalbuterol  0.63 mg Nebulization BID  . metoprolol  50 mg Oral BID  . multivitamin with minerals  1 tablet Oral Daily  . pantoprazole  40 mg Oral Daily  . predniSONE  60 mg Oral QAC breakfast  . sodium chloride flush  3 mL Intravenous Q12H  .  thiamine  100 mg Oral Daily   Or  . thiamine  100 mg Intravenous Daily   Continuous Infusions: . heparin 1,100 Units/hr (07/06/15 1739)    Principal Problem:   Dyspnea Active Problems:   Hypothermia   CAD in native artery - CABG x4 (LIMA-LAD, SVG-distal RCA, SVG-diagonal, SVG-Ramus   Hypertension, essential   Chronic renal insufficiency, stage III (moderate)   Acute on chronic renal failure (HCC)   Transaminitis   Atrial fibrillation (HCC)   Elevated troponin   Unintentional weight loss   Lactic acidosis   Sepsis (HCC)   Community acquired pneumonia   Shortness of breath   NSTEMI (non-ST elevated myocardial infarction) (HCC)   Persistent atrial fibrillation (HCC)   SOB (shortness of breath)   New onset atrial fibrillation (HCC)   Congestive dilated cardiomyopathy (HCC)    Time spent: 40 minutes    Kamariyah Timberlake M.D. [facility] Pager  [phone]. If 7PM-7AM, please contact night-coverage at [email or web address], password [facility] 07/07/2015, 8:11 AM  LOS: 4 days

## 2015-07-07 NOTE — Progress Notes (Signed)
[facility] Progress Note   Subjective:  Coughing more Seems more SOB Had abd films today b/y hypothermia Plain films showed pneumoperitoneum Surgery is evaluation For CTA this afternoon  Diuretics held yesterday UOP down Creatinine continues to slowly rise  She CANNOT yet make a decision about whether she would accept dialysis if it was needed Cardiology's hands are tied as they can't cath her with her degree of RF (unless she were to agree to HD which would undoubtedly be needed if cath)    Filed Vitals:   07/07/15 1014 07/07/15 1122 07/07/15 1230 07/07/15 1328  BP:    143/72  Pulse:    74  Temp: 97.2 F (36.2 C)  97.5 F (36.4 C) 98 F (36.7 C)  TempSrc: Rectal  Rectal Oral  Resp:    18  Height:      Weight:      SpO2:  98%  100%   Exam: Alert no distress No jvd Chest ant clear no wheezes right now Irreg irreg rhythm no MRG Abd obese, soft ntnd no hsm noted  MS no joint chgs There is no edema of LE's Neuro nonfocal N asterixis, Ox 3  Inpatient medications: . aspirin EC  81 mg Oral Daily  . diphenhydrAMINE  50 mg Oral Once  . folic acid  1 mg Oral Daily  . iohexol  25 mL Oral Q1 Hr x 2  . levalbuterol  0.63 mg Nebulization BID  . metoprolol  50 mg Oral BID  . multivitamin with minerals  1 tablet Oral Daily  . pantoprazole  40 mg Oral Daily  . predniSONE  60 mg Oral QAC breakfast  . sodium chloride flush  3 mL Intravenous Q12H  . thiamine  100 mg Oral Daily   Or  . thiamine  100 mg Intravenous Daily   Infusions . heparin 700 Units/hr (07/07/15 1114)   Prn medicines acetaminophen **OR** acetaminophen, levalbuterol, LORazepam, ondansetron **OR** ondansetron (ZOFRAN) IV   Recent Labs Lab 07/05/15 0551 07/06/15 0615 07/07/15 0547  NA 130* 128* 130*  K 3.8 3.9 4.2  CL 92* 91* 91*  CO2 21* 20* 21*  GLUCOSE 143* 130* 141*  BUN 132* 158* 158*  CREATININE 3.90* 4.36* 4.65*  CALCIUM 7.7* 7.5* 7.9*  PHOS  --   --  8.5*     Recent Labs Lab 07/05/15 0551 07/06/15 0615 07/07/15 0547  AST 802* 695* 612*  ALT 695* 722* 815*  ALKPHOS 119 119 126  BILITOT 0.9 1.1 0.8  PROT 5.9* 5.6* 6.3*  ALBUMIN 3.3* 3.2* 3.7    Recent Labs Lab 07/03/15 1236  07/05/15 0551 07/06/15 0615 07/07/15 0547  WBC 7.7  < > 7.9 8.2 8.7  NEUTROABS 5.8  --   --   --   --   HGB 10.3*  < > 10.9* 10.4* 11.0*  HCT 30.4*  < > 32.9* 30.7* 33.1*  MCV 96.2  < > 96.5 95.6 95.4  PLT 186  < > 202 177 202  < > = values in this interval not displayed.   Assessment: 1. SOB/ cough/ hypoxemia - improved initially with diuresis. Very poor LV function (EF 20-25%), and new afib. Diuretics held yesterday d/t continued rise in creatinine.  UOP down, more SOB, last CXR vascular congestion. Resume lasix 80 IV bid 2. CKD4-> now CKD 5 (AKI on CKD) diuresis for CHF, very low EF, new AF - cardiorenal component. Bump in creat from baseline mid 2's.  BUN/Cr worse again today. Creatinine  has increased 3.9->4.36->4.65, BUN 132->158 (steroids likely playing a role in the latter).  Lasix held for past 24 hours.  Still with no gross uremic symptoms but at risk for requiring dialysis if develops more confusion or some other uremic complication.  Renal issues are precluding cardiac eval for her very low EF. She has been unable to make a decision about whether she would accept dialysis or not. Issues discussed at length with family.  No absolute indication but we are really just treading water right now. They will all talk as a family tonight and let me know something tomorrow 3. CAD hx CABG with severe dilated / CM EF 20-25% - can't get cath for workup with adv CAD unless she were to accept HD. 4. New onset afib/ aflutter - per primary 5. Pneumoperitoneum - for CT this afternoon. Abd exam very benign.  6. HTN meds 7. HL on alirocumab (Praluent) injections - Dr. Loreta Ave has recommended stopping 8. ID - no fever 9. ^LFT's - etoh vs congestion. AST/ALT remain high  [id number]/722 today - have not fallen to any great degree.    Camille Bal, MD [facility] [phone] Pager 07/07/2015, 2:26 PM

## 2015-07-07 NOTE — Consult Note (Signed)
Reason for Consult: pneumoperitoneum  Referring Physician: Dr. Ramiro Harvest    HPI: [name] is a 80 year old female with a history of CAD s/p CABG 2007, EtOH abuse, PVD, renal artery stenosis s/p left renal stent, CKD, atrial fibrillation, CHF(EF 20-25%), carotid artery disease, HTN and anemia admitted 4 days ago with shortness of breath, abnormal LFTs, new onset atrial fibrillation.  She was found to have hypothermia this AM with temp of 94.1 which prompted an AXR which revealed possible free air above the liver.  We have therefore been asked to evaluate.  Surgical history includes; appendectomy, hysterectomy, partial colectomy for diverticulitis and rectovaginal fistula in 1997.   The patient denies any abdominal pain, nausea, vomiting.  She is having bowel movements and was tolerating a diet.   Normotensive without a white count.  Normal lactic acid.  Reports fevers prior to admission.  Appetite has been regular.  Denies any significant weight loss.    Past Medical History  Diagnosis Date  . CAD in native artery 2007    a. S/p CABG 2007 (LIMA-LAD, SVG-dRCA, SVG-intermediate, SVG-diagonal). b. Nuclear stress test 08/2012: normal, low risk.  . S/P CABG x 4 2007    LIMA-LAD, SVG-Distal RCA, SVG-Ramus, SVG-Diagonal; no echocardiogram done  . PAD (peripheral artery disease) (HCC) 1997    Mild-moderate carotid disease; status post right SFA occlusion with Fem-Below Knee Pop bypass (Dr. Hart Rochester); LEA Dopplers 07/2014: RIGHT - ABI 0.84, CIA/EIA 50-69%, CFA/PFA patent Fem-Pop bypass patent w/ 70-99% anastomotic stenosis, patent Pop A with 3 V runoff; LABI 0.85- L CIA/EIA patent, LPFA 70-99%, dLSFA 50-69%, patent LPopA & 3 V runoff.  . Renovascular hypertension 12/17/2013    a. s/p L RA Stent 11/2013. b. Renal duplex 02/2015: >60% right proximal renal artery stenosis, normal left renal artery s/p stent, f/u 1 yr recommended.  . CKD (chronic kidney disease), stage III   . Essential hypertension    . Hyperlipidemia LDL goal <70   . Carotid artery disease (HCC)     a. Mild-mod carotid disease per Dr.Berry's note.  . Anemia     Past Surgical History  Procedure Laterality Date  . Cardiac catheterization  December 2007    LAD-90% mid, D19 percent ostial. Circumflex-OM1 90% ostial, 80% mid. RCA 9% proximal, 100% mid  . Femoropopliteal bypass Right 1997    Dr. Hart Rochester: SFA-below knee Pop  . Coronary artery bypass graft  December 2007    LIMA-LAD, SVG-distal RCA, SVG-D1, SVG-OM1/Ramus  . Nm myoview ltd  April 2014    EF 63%, mild fixed anteroapical defect thought to be breast attenuation  . Appendectomy    . Tonsillectomy    . Cataract surgery    . Rectovaginal fistula repair    . [facility]  1981  . Renal artery stent Left 12/16/13    using CO2  . Lower extremity venous doppler  06/13/2011    No evidence of thrombus or thrombophlebitis, no venous insuffiency noted.  . Cardiovascular stress test  08/28/2012    Normal stress nuclear study with likely breast attenuation, low risk stress test.  . Renal angiogram N/A 12/16/2013    Procedure: RENAL ANGIOGRAM;  Surgeon: Runell Gess, MD;  Location: [facility] CATH LAB;  Service: Cardiovascular;  Laterality: N/A;  . Percutaneous stent intervention Left 12/16/2013    Procedure: PERCUTANEOUS STENT INTERVENTION - LEFT RENAL ARTERY;  Surgeon: Runell Gess, MD;  Location: [facility] CATH LAB;  Service: Cardiovascular;  Laterality: Left;  renal    Family  History  Problem Relation Age of Onset  . Breast cancer Mother   . Heart attack Father   . Cancer - Cervical Brother     Social History:  reports that she quit smoking about 22 years ago. Her smoking use included Cigarettes. She has never used smokeless tobacco. She reports that she drinks about 4.2 oz of alcohol per week. She reports that she does not use illicit drugs.  Allergies:  Allergies  Allergen Reactions  . Angiotensin Receptor Blockers   . Latex Swelling  . Statins Other (See Comments)     Diffuse cramping; has tried Lipitor, Crestor, Pravachol and simvastatin  . Benicar [Olmesartan] Itching and Rash    Medications:  Scheduled Meds: . aspirin EC  81 mg Oral Daily  . diphenhydrAMINE  50 mg Oral Once  . folic acid  1 mg Oral Daily  . iohexol  25 mL Oral Q1 Hr x 2  . levalbuterol  0.63 mg Nebulization BID  . metoprolol  50 mg Oral BID  . multivitamin with minerals  1 tablet Oral Daily  . pantoprazole  40 mg Oral Daily  . predniSONE  60 mg Oral QAC breakfast  . sodium chloride flush  3 mL Intravenous Q12H  . thiamine  100 mg Oral Daily   Or  . thiamine  100 mg Intravenous Daily   Continuous Infusions: . heparin 700 Units/hr (07/07/15 1114)   PRN Meds:.acetaminophen **OR** acetaminophen, levalbuterol, LORazepam, ondansetron **OR** ondansetron (ZOFRAN) IV   Results for orders placed or performed during the hospital encounter of 07/03/15 (from the past 48 hour(s))  Heparin level (unfractionated)     Status: None   Collection Time: 07/05/15  6:05 PM  Result Value Ref Range   Heparin Unfractionated 0.55 0.30 - 0.70 IU/mL    Comment:        IF HEPARIN RESULTS ARE BELOW EXPECTED VALUES, AND PATIENT DOSAGE HAS BEEN CONFIRMED, SUGGEST FOLLOW UP TESTING OF ANTITHROMBIN III LEVELS.   CBC     Status: Abnormal   Collection Time: 07/06/15  6:15 AM  Result Value Ref Range   WBC 8.2 4.0 - 10.5 K/uL   RBC 3.21 (L) 3.87 - 5.11 MIL/uL   Hemoglobin 10.4 (L) 12.0 - 15.0 g/dL   HCT 30.7 (L) 36.0 - 46.0 %   MCV 95.6 78.0 - 100.0 fL   MCH 32.4 26.0 - 34.0 pg   MCHC 33.9 30.0 - 36.0 g/dL   RDW 12.8 11.5 - 15.5 %   Platelets 177 150 - 400 K/uL  Heparin level (unfractionated)     Status: None   Collection Time: 07/06/15  6:15 AM  Result Value Ref Range   Heparin Unfractionated 0.57 0.30 - 0.70 IU/mL    Comment:        IF HEPARIN RESULTS ARE BELOW EXPECTED VALUES, AND PATIENT DOSAGE HAS BEEN CONFIRMED, SUGGEST FOLLOW UP TESTING OF ANTITHROMBIN III LEVELS.   Comprehensive  metabolic panel     Status: Abnormal   Collection Time: 07/06/15  6:15 AM  Result Value Ref Range   Sodium 128 (L) 135 - 145 mmol/L   Potassium 3.9 3.5 - 5.1 mmol/L   Chloride 91 (L) 101 - 111 mmol/L   CO2 20 (L) 22 - 32 mmol/L   Glucose, Bld 130 (H) 65 - 99 mg/dL   BUN 158 (H) 6 - 20 mg/dL    Comment: RESULTS CONFIRMED BY MANUAL DILUTION   Creatinine, Ser 4.36 (H) 0.44 - 1.00 mg/dL   Calcium 7.5 (  L) 8.9 - 10.3 mg/dL   Total Protein 5.6 (L) 6.5 - 8.1 g/dL   Albumin 3.2 (L) 3.5 - 5.0 g/dL   AST 695 (H) 15 - 41 U/L   ALT 722 (H) 14 - 54 U/L   Alkaline Phosphatase 119 38 - 126 U/L   Total Bilirubin 1.1 0.3 - 1.2 mg/dL   GFR calc non Af Amer 9 (L) >60 mL/min   GFR calc Af Amer 10 (L) >60 mL/min    Comment: (NOTE) The eGFR has been calculated using the CKD EPI equation. This calculation has not been validated in all clinical situations. eGFR's persistently <60 mL/min signify possible Chronic Kidney Disease.    Anion gap 17 (H) 5 - 15  Magnesium     Status: None   Collection Time: 07/06/15  6:15 AM  Result Value Ref Range   Magnesium 2.2 1.7 - 2.4 mg/dL  CBC     Status: Abnormal   Collection Time: 07/07/15  5:47 AM  Result Value Ref Range   WBC 8.7 4.0 - 10.5 K/uL   RBC 3.47 (L) 3.87 - 5.11 MIL/uL   Hemoglobin 11.0 (L) 12.0 - 15.0 g/dL   HCT 33.1 (L) 36.0 - 46.0 %   MCV 95.4 78.0 - 100.0 fL   MCH 31.7 26.0 - 34.0 pg   MCHC 33.2 30.0 - 36.0 g/dL   RDW 13.1 11.5 - 15.5 %   Platelets 202 150 - 400 K/uL  Heparin level (unfractionated)     Status: Abnormal   Collection Time: 07/07/15  5:47 AM  Result Value Ref Range   Heparin Unfractionated 1.05 (H) 0.30 - 0.70 IU/mL    Comment:        IF HEPARIN RESULTS ARE BELOW EXPECTED VALUES, AND PATIENT DOSAGE HAS BEEN CONFIRMED, SUGGEST FOLLOW UP TESTING OF ANTITHROMBIN III LEVELS.   Comprehensive metabolic panel     Status: Abnormal   Collection Time: 07/07/15  5:47 AM  Result Value Ref Range   Sodium 130 (L) 135 - 145 mmol/L    Potassium 4.2 3.5 - 5.1 mmol/L   Chloride 91 (L) 101 - 111 mmol/L   CO2 21 (L) 22 - 32 mmol/L   Glucose, Bld 141 (H) 65 - 99 mg/dL   BUN 158 (H) 6 - 20 mg/dL    Comment: RESULTS CONFIRMED BY MANUAL DILUTION   Creatinine, Ser 4.65 (H) 0.44 - 1.00 mg/dL   Calcium 7.9 (L) 8.9 - 10.3 mg/dL   Total Protein 6.3 (L) 6.5 - 8.1 g/dL   Albumin 3.7 3.5 - 5.0 g/dL   AST 612 (H) 15 - 41 U/L   ALT 815 (H) 14 - 54 U/L   Alkaline Phosphatase 126 38 - 126 U/L   Total Bilirubin 0.8 0.3 - 1.2 mg/dL   GFR calc non Af Amer 8 (L) >60 mL/min   GFR calc Af Amer 10 (L) >60 mL/min    Comment: (NOTE) The eGFR has been calculated using the CKD EPI equation. This calculation has not been validated in all clinical situations. eGFR's persistently <60 mL/min signify possible Chronic Kidney Disease.    Anion gap 18 (H) 5 - 15  Phosphorus     Status: Abnormal   Collection Time: 07/07/15  5:47 AM  Result Value Ref Range   Phosphorus 8.5 (H) 2.5 - 4.6 mg/dL  Lactic acid, plasma     Status: None   Collection Time: 07/07/15  8:38 AM  Result Value Ref Range   Lactic Acid, Venous 1.5  0.5 - 2.0 mmol/L  TSH     Status: None   Collection Time: 07/07/15  8:46 AM  Result Value Ref Range   TSH 0.922 0.350 - 4.500 uIU/mL  Procalcitonin - Baseline     Status: None   Collection Time: 07/07/15  8:46 AM  Result Value Ref Range   Procalcitonin 3.79 ng/mL    Comment:        Interpretation: PCT > 2 ng/mL: Systemic infection (sepsis) is likely, unless other causes are known. (NOTE)         ICU PCT Algorithm               Non ICU PCT Algorithm    ----------------------------     ------------------------------         PCT < 0.25 ng/mL                 PCT < 0.1 ng/mL     Stopping of antibiotics            Stopping of antibiotics       strongly encouraged.               strongly encouraged.    ----------------------------     ------------------------------       PCT level decrease by               PCT < 0.25 ng/mL       >=  80% from peak PCT       OR PCT 0.25 - 0.5 ng/mL          Stopping of antibiotics                                             encouraged.     Stopping of antibiotics           encouraged.    ----------------------------     ------------------------------       PCT level decrease by              PCT >= 0.25 ng/mL       < 80% from peak PCT        AND PCT >= 0.5 ng/mL            Continuing antibiotics                                               encouraged.       Continuing antibiotics            encouraged.    ----------------------------     ------------------------------     PCT level increase compared          PCT > 0.5 ng/mL         with peak PCT AND          PCT >= 0.5 ng/mL             Escalation of antibiotics                                          strongly encouraged.      Escalation of antibiotics  strongly encouraged.   Lactic acid, plasma     Status: None   Collection Time: 07/07/15 11:01 AM  Result Value Ref Range   Lactic Acid, Venous 1.0 0.5 - 2.0 mmol/L    Dg Abd Acute W/chest  07/07/2015  CLINICAL DATA:  Hypothermia, history of CABG, former smoking history EXAM: DG ABDOMEN ACUTE W/ 1V CHEST COMPARISON:  Chest x-ray of 07/05/2015 and ultrasound of the abdomen of 07/04/2015 FINDINGS: No active infiltrate or effusion is seen. Cardiomegaly is stable. Median sternotomy sutures are noted from prior CABG. Supine and left lateral decubitus films of the abdomen were obtained. Better seen on the supine film there are somewhat distended loops of small bowel, and the colon and distal small bowel appears decompressed. A partial small bowel obstruction is a definite consideration. On the left lower decubitus films, there is some lucency just above the right lobe of liver, and a small amount of free intraperitoneal air cannot be excluded. CT the abdomen pelvis is recommended to assess for possible free air as well as evaluate the possibility of partial small bowel obstruction. No  opaque calculi are seen. The bones are osteopenic. IMPRESSION: 1. Prominent loops of small bowel may indicate a partial small bowel obstruction. 2. Cannot exclude a small amount of free air on the decubitus film. In view of these findings, CT of the abdomen pelvis at this time is recommended. 3. No active lung disease.  Cardiomegaly. I spoke with Dr. Ramiro Harvest concerning the findings of this study at 12:51 p.m. Electronically Signed   By: Dwyane Dee M.D.   On: 07/07/2015 12:52    Review of Systems  Respiratory: Positive for cough. Negative for hemoptysis, sputum production, shortness of breath and wheezing.   Cardiovascular: Negative for chest pain, palpitations, orthopnea, claudication, leg swelling and PND.  Gastrointestinal: Negative for nausea, vomiting, abdominal pain, diarrhea, constipation, blood in stool and melena.  Genitourinary: Negative for dysuria, urgency, frequency, hematuria and flank pain.  Musculoskeletal: Negative for myalgias.  Neurological: Negative for dizziness, tingling, tremors, sensory change, speech change, focal weakness, seizures, loss of consciousness and headaches.   Blood pressure 143/72, pulse 74, temperature 98 F (36.7 C), temperature source Oral, resp. rate 18, height 5\' 3"  (1.6 m), weight 78.019 kg (172 lb), SpO2 100 %. Physical Exam  Constitutional: She is oriented to person, place, and time. She appears well-developed and well-nourished. No distress.  Cardiovascular: Normal heart sounds and intact distal pulses.  Exam reveals no gallop and no friction rub.   No murmur heard. Respiratory: Effort normal and breath sounds normal. No respiratory distress. She has no wheezes. She has no rales. She exhibits no tenderness.  GI: Soft. Bowel sounds are normal. She exhibits no distension and no mass. There is no tenderness. There is no rebound and no guarding.  Musculoskeletal: Normal range of motion. She exhibits no edema or tenderness.  Neurological: She is  alert and oriented to person, place, and time.  Skin: Skin is warm and dry. No rash noted. She is not diaphoretic. No erythema. No pallor.  Psychiatric: She has a normal mood and affect. Her behavior is normal. Judgment and thought content normal.    Assessment/Plan 79 y/o/f with a history of CAD s/p CABG 2007, EtOH abuse, PVD, renal artery stenosis s/p left renal stent, CKD, atrial fibrillation, CHF(EF 20-25%), carotid artery disease, HTN, anemia, diverticulitis s/p colon resection for rectovaginal fistula.   Transaminitis hypothermia Abdominal exam is benign.  Will await CT scan, however, should CT demonstrate  free air, will continue to watch her clinically given she has no symptoms.  No indications for surgical intervention.  Abnormal LFTs may be secondary to alcohol use or medication related.  No further recs.  Thank you for the consult.  Will follow along for now.   [name] ANP-BC 07/07/2015, 1:48 PM

## 2015-07-07 NOTE — Progress Notes (Addendum)
ANTICOAGULATION CONSULT NOTE - F/u Consult  Pharmacy Consult for heparin Indication: chest pain/ACS and atrial fibrillation  Allergies  Allergen Reactions  . Angiotensin Receptor Blockers   . Latex Swelling  . Statins Other (See Comments)    Diffuse cramping; has tried Lipitor, Crestor, Pravachol and simvastatin  . Benicar [Olmesartan] Itching and Rash    Patient Measurements: Height:  (160 cm) Weight: 172 lb (78.019 kg) IBW/kg (Calculated) : 52.4 Heparin Dosing Weight: 63 kg  Vital Signs: Temp: 96.7 F (35.9 C) (02/14 0745) Temp Source: Rectal (02/14 0745) BP: 125/74 mmHg (02/14 0518) Pulse Rate: 97 (02/14 0518)  Labs:  Recent Labs  07/04/15 0952  07/05/15 0551  07/05/15 1805 07/06/15 0615 07/07/15 0547  HGB  --   < > 10.9*  --   --  10.4* 11.0*  HCT  --   --  32.9*  --   --  30.7* 33.1*  PLT  --   --  202  --   --  177 202  HEPARINUNFRC  --   < >  --   < > 0.55 0.57 1.05*  CREATININE  --   --  3.90*  --   --  4.36* 4.65*  CKTOTAL 318*  --   --   --   --   --   --   < > = values in this interval not displayed.  Estimated Creatinine Clearance: 10 mL/min (by C-G formula based on Cr of 4.65).   Medical History: Past Medical History  Diagnosis Date  . CAD in native artery 2007    a. S/p CABG 2007 (LIMA-LAD, SVG-dRCA, SVG-intermediate, SVG-diagonal). b. Nuclear stress test 08/2012: normal, low risk.  . S/P CABG x 4 2007    LIMA-LAD, SVG-Distal RCA, SVG-Ramus, SVG-Diagonal; no echocardiogram done  . PAD (peripheral artery disease) (HCC) 1997    Mild-moderate carotid disease; status post right SFA occlusion with Fem-Below Knee Pop bypass (Dr. Hart Rochester); LEA Dopplers 07/2014: RIGHT - ABI 0.84, CIA/EIA 50-69%, CFA/PFA patent Fem-Pop bypass patent w/ 70-99% anastomotic stenosis, patent Pop A with 3 V runoff; LABI 0.85- L CIA/EIA patent, LPFA 70-99%, dLSFA 50-69%, patent LPopA & 3 V runoff.  . Renovascular hypertension 12/17/2013    a. s/p L RA Stent 11/2013. b. Renal  duplex 02/2015: >60% right proximal renal artery stenosis, normal left renal artery s/p stent, f/u 1 yr recommended.  . CKD (chronic kidney disease), stage III   . Essential hypertension   . Hyperlipidemia LDL goal <70   . Carotid artery disease (HCC)     a. Mild-mod carotid disease per Dr.Berry's note.  . Anemia     Assessment: Patient's a 80 y.o F presented to the ED on 2/10 with c/o SOB.  Troponin was found to be elevated in the ED and with new onset afib.  To start heparin for Afib and r/o ACS.  Patient reported no recent bleeding events or surgical procedure.  Today, 07/07/2015:  HL previously therapeutic and stable at 1100 units/hr, now 1.05 this AM.  Verified lab drawn correctly and pump running at 11 ml/hr.  No bleeding or infusion issues per patient or RN  CBC stable/improved Plavix stopped by MD, continues on ASA 81 mg Advanced CKD, CrCl stable ~10 ml/min  Goal of Therapy:  Heparin level 0.3-0.7 units/ml Monitor platelets by anticoagulation protocol: Yes   Plan:   Hold heparin for 1 hr, restarting at Continue heparin infusion at 700 units/hr  Recheck heparin level 8 hrs after restart  Daily CBC and HL while on heparin infusion  F/u plans for transition to PO anticoag. Per cards, poor long-term candidate for [facility].   Bernadene Person, PharmD, BCPS Pager: [phone] 07/07/2015, 8:50 AM

## 2015-07-07 NOTE — Progress Notes (Signed)
Patient: [name] / Admit Date: 07/03/2015 / Date of Encounter: 07/07/2015, 8:48 AM   Subjective: Felt chills this AM but now feeling warm. No CP or SOB. No LEE. Wheezing resolved. Cough much improved.   Objective: Telemetry: atrial fib, rate controlled, occ PVCs Physical Exam: Blood pressure 125/74, pulse 97, temperature 96.7 F (35.9 C), temperature source Rectal, resp. rate 20, height  (1.6 m), weight 172 lb (78.019 kg), SpO2 98 %. General: Well developed, well nourished WF in no acute distress. Head: Normocephalic, atraumatic, sclera non-icteric, no xanthomas, nares are without discharge. Neck: JVP not elevated. Lungs: Crackles L lung base, otherwise no rhonchi, wheezes or rales. Breathing is unlabored. Heart: RRR S1 S2 without murmurs, rubs, or gallops.  Abdomen: Soft, non-tender, non-distended with normoactive bowel sounds. No rebound/guarding. Extremities: No clubbing or cyanosis. No edema. Distal pedal pulses are 2+ and equal bilaterally. Neuro: Alert and oriented X 3. Moves all extremities spontaneously. Psych: Responds to questions appropriately with a normal affect.   Intake/Output Summary (Last 24 hours) at 07/07/15 0848 Last data filed at 07/07/15 0748  Gross per 24 hour  Intake    208 ml  Output    700 ml  Net   -492 ml    Inpatient Medications:  . aspirin EC  81 mg Oral Daily  . diphenhydrAMINE  50 mg Oral Once  . folic acid  1 mg Oral Daily  . levalbuterol  0.63 mg Nebulization BID  . metoprolol  50 mg Oral BID  . multivitamin with minerals  1 tablet Oral Daily  . pantoprazole  40 mg Oral Daily  . predniSONE  60 mg Oral QAC breakfast  . sodium chloride flush  3 mL Intravenous Q12H  . thiamine  100 mg Oral Daily   Or  . thiamine  100 mg Intravenous Daily   Infusions:  . heparin 1,100 Units/hr (07/06/15 1739)    Labs:  Recent Labs  07/06/15 0615 07/07/15 0547  NA 128* 130*  K 3.9 4.2  CL 91* 91*  CO2 20* 21*  GLUCOSE 130* 141*   BUN 158* 158*  CREATININE 4.36* 4.65*  CALCIUM 7.5* 7.9*  MG 2.2  --   PHOS  --  8.5*    Recent Labs  07/06/15 0615 07/07/15 0547  AST 695* 612*  ALT 722* 815*  ALKPHOS 119 126  BILITOT 1.1 0.8  PROT 5.6* 6.3*  ALBUMIN 3.2* 3.7    Recent Labs  07/06/15 0615 07/07/15 0547  WBC 8.2 8.7  HGB 10.4* 11.0*  HCT 30.7* 33.1*  MCV 95.6 95.4  PLT 177 202    Recent Labs  07/04/15 0952  CKTOTAL 318*   Invalid input(s): POCBNP No results for input(s): HGBA1C in the last 72 hours.   Radiology/Studies:  Dg Chest 2 View  07/05/2015  CLINICAL DATA:  80 year old female with shortness of breath. EXAM: CHEST  2 VIEW COMPARISON:  07/03/2015 and prior exams FINDINGS: Cardiomegaly, CABG changes and mild pulmonary vascular congestion again noted. There is no evidence of focal airspace disease, pulmonary edema, suspicious pulmonary nodule/mass, pleural effusion, or pneumothorax. No acute bony abnormalities are identified. IMPRESSION: Cardiomegaly with mild pulmonary vascular congestion. Electronically Signed   By: Harmon Pier M.D.   On: 07/05/2015 09:26   Dg Chest 2 View  07/03/2015  CLINICAL DATA:  Cough, congestion, shortness of breath, and weakness for 3 weeks. EXAM: CHEST  2 VIEW COMPARISON:  05/30/2006 FINDINGS: Mild enlargement of the cardiopericardial silhouette, with mild cephalization of blood  flow but no overt edema. No Kerley B-lines observed. Prior CABG.  Atherosclerotic calcification of the aortic arch. Thoracic spondylosis.  No pleural effusion. IMPRESSION: 1. Stable mild enlargement of the cardiopericardial silhouette, with pulmonary venous hypertension but without overt edema. 2. Prior CABG. 3. Atherosclerotic aortic arch. Electronically Signed   By: Gaylyn Rong M.D.   On: 07/03/2015 12:06   US Abdomen Complete  07/04/2015  CLINICAL DATA:  81 year old female with transaminitis/ elevated LFTs. Patient with chronic renal disease. EXAM: ABDOMEN ULTRASOUND COMPLETE  COMPARISON:  None. FINDINGS: Gallbladder: The gallbladder is unremarkable. There is no evidence of acute cholecystitis or cholelithiasis. Common bile duct: Diameter: 5 mm. There is no evidence of intrahepatic or extrahepatic biliary dilatation. Liver: Slightly increased and heterogeneous hepatic echotexture identified without focal lesions. IVC: No abnormality visualized. Pancreas: Visualized portion unremarkable. Spleen: Size and appearance within normal limits. Right Kidney: Length: 11.1 cm. Cortical thinning and increased renal echogenicity noted. There is no evidence of hydronephrosis or solid mass. Left Kidney: Length: 11.1 cm. Cortical thinning and increased renal echogenicity noted. There is no evidence of hydronephrosis or solid mass. Abdominal aorta: No aneurysm visualized. Other findings: None. IMPRESSION: Slightly increased and heterogeneous hepatic echotexture which is nonspecific but may represent hepatic steatosis. Cortical thinning and increased renal echogenicity compatible with medical renal disease. No other significant abnormalities. Electronically Signed   By: Harmon Pier M.D.   On: 07/04/2015 12:29     Assessment and Plan  80 y/o F with history of CAD s/p CABG 2007 (LIMA-LAD, SVG-dRCA, SVG-intermediate, SVG-diagonal, neg nuc 2014), EtOH abuse, PVD s/p right femoropopliteal bypass grafting, renal artery stenosis s/p L renal stent 11/2013 (residual RAS being followed for now), reported mild-mod carotid artery disease, CKD stage III, chronic-appearing anemia, former tobacco abuse admitted with fever, cough, acute kidney injury on CKD (BUN 93/Cr 3.64 - previously 2 range), newly recognized atrial fib (rates 90-110 on admission), marked transaminitis, lactic acidosis, elevated BNP and initial troponin 1.09. Patient also initially reported 30 lb weight loss but actual weights in computer (beside stated weight) congruent with prior. 2D Echo 07/05/15: EF 20-25%, diffuse HK, akinesis of anteroseptal and  inferolateral myocardium, mild AI, mod MR, severely dilated LA, mildly dilated RA, mod reduced RV function. (Prior EF by nuc in 2014 was 63%).  1. Cough/fever/SOB/wheezing possibly due to combination of PNA and acute combined systolic and diastolic CHF - now with hypothermia on 07/07/15, unclear cause. Presentation initially suspected pulmonary & improved with steroids/abx, but renal was also concerned for component of acute systolic CHF in the setting of acute renal failure with new low EF. Diuretics being managed by renal -> now on hold. Does not appear significantly volume overloaded on exam. Per MD, would consider RHC before restarting diuretics to determine volume status. Not a candidate for ACEI/ARB/spiro with AKI. Would consolidate Lopressor to Toprol closer to d/c. Etiology of LV dysfunction not clear - top ddx includes ischemia, r/t afib, alcohol abuse, or viral. See below.   2. AKI on CKD stage III - BUN/Cr continue to rise - up to 158/4.65. Nephrology following.  3. Elevated troponin - peak trop 1.09. Not presently a candidate for invasive evaluation with renal failure. With ? EF by echo will need repeat ischemic eval once acute issues sort out. Dr. Jens Som discontinued Plavix. Continue ASA. She denies any recent angina.   4. Newly recognized atrial fibrillation - TSH normal. Continue rate control. Not currently a candidate for DOAC with renal dysfunction. With EtOH abuse she may not  be a good long-term anticoagulation candidate. Per Dr. Ludwig Clarks note, continue heparin per pharmacy and transition to Coumadin later pending renal function with plan for DCCV as outpt if atrial fibrillation persists. Afib could also be related to acute illness so as she recovers could treat with short term anticoagulation and then proceed with DCCV to see if she holds sinus and then DC anticoagulation 4 weeks later. Dr. Herbie Baltimore is concerned about potential use of warfarin as well, however, given elevated LFTs. Will  need to follow.  5. EtOH abuse - counseled regarding cessation. On CIWA.   6. Acute transaminitis - felt possibly related to alcohol abuse, but levels persist. Abd [place] increased heterogeneous hepatic echotexture nonspecific but may represent hepatic steatosis. Acute hep panel neg. Per IM, they discussed case with Dr. Loreta Ave who recommended discontinuing Praluent with outpatient f/u.  Signed, Ronie Spies PA-C Pager: [phone]  ATTENDING ATTESTATION:  I have seen and examined the patient along with Mrs. Dunn, PA-C.  I have reviewed the chart, notes and new data.  I agree with her findings, examination & recommendations as noted above.  Brief Description: 80 year old woman with known CAD status post CABG, long-standing alcohol abuse, PAD and stage III chronic renal insufficiency who was admitted with combination of multiple organ failure but the most notable symptom was acute hypoxic respiratory failure. He she was found to have acute on chronic renal injury, severely elevated LFTs as well as echocardiogram evidence of severely reduced ejection fraction/cardiac myopathy with diffuse hypo-and akinesis. She denies having any symptoms of chest pain prior to this.   She is currently being treated for combination of possible pneumonia versus acute combined heart failure. She has had progressively worsening renal function with diuresis. Currently the diuretics are on hold. Elevated transaminases was thought to be related to Praluent - now on hold (cannot exclude the possibility of this is simply congestive hepatopathy) Worsening renal function could very well be consistent with cardiorenal syndrome.  At this point, we have no clear way of knowing her hemodynamics.   Key new complaints: This morning she was found to be hypothermic; abdominal x-ray suggested possible perforated viscus not confirmed by CT scan. She was transferred to ICU/stepdown for [employer].  Key examination changes: Appears somewhat  confused and almost delirious. She apparently she just received Valium.  Heart rate continues to be in stable and A. fib. I do not see clear JVD although she does have some Cannon A waves. I do not necessarily hear rales.  Key new findings / data: Labs reviewed - creatinine now up to 4.65 with BUN of 158. Pro-calcitonin 3.79 which is elevated. ALT actually went up but AST went down.  PLAN:  Her A. fib seems relatively well rate controlled with beta blocker. Currently on IV heparin.  Her reduced ejection fraction is very concerning to me. It's hard to tell if this is a stress reaction to an underlying illness, or if this is the initial inciting etiology leading to congestive hepatopathy and congestive nephropathy. She certainly has not had improvement in renal function with diuresis. This leads me to wonder if we are probably diagnosing volume overload.  As I mentioned yesterday, would benefit from right heart catheterization to know what her filling pressures are in the extent of volume overload.  As part of an ischemic evaluation for her new with a reduced EF and positive troponin, cardiac catheterization would be most definitive. Apparently the nephrology team is talked with the patient and family about the fact  she is pretty much having the direction of needing dialysis.  At this point think a reasonable plan to consider would be to move forward with the concept of placing a dialysis catheter and initiating hemodialysis for volume removal plus renal replacement. If this is the direction where heading, then we can consider right and left heart catheterization with potential staged PCI if indicated.  This would basically provide [place] data to support the need for additional diuresis, it could also tell us the etiology of this newly found cardiomyopathy. The caveat would be the knowledge that there could be definitely worsening renal function.  Furosemide was restarted by nephrology, and she still is  responding some.    The logistics of this plan of action will require having a dialysis line placed then transferring the patient to [facility] for dialysis and then cardiac catheterization. Patient will likely need to remain on the hospitalist service. Further plans will be dictated based on cath results.  Plan caveat here is with her hypo-thermia and elevated pro-calcitonin levels, we need to ensure there is no active sepsis. This would preclude cardiac catheterization   Marykay Lex, M.D., M.S.  [address] [place], [place]  [address]  [phone]  07/07/2015 8:19 PM

## 2015-07-07 NOTE — Care Management Note (Signed)
Case Management Note  Patient Details  Name: [name] MRN: [id number] Date of Birth: 18-May-1936  Subjective/Objective:Transfer to [facility] w/rvr,hypothermia.PT recc-HHPT. Provided family w/HHC agency list, since pcp-Dr. Juleen China informed dtr to contact pcp office to confirm which [facility] agency doctor prefers.dtr voiced understanding.                     Action/Plan:d/c plan home w/HHC.   Expected Discharge Date:   (unkown)               Expected Discharge Plan:  Home w Home Health Services  In-House Referral:     Discharge planning Services  CM Consult  Post Acute Care Choice:    Choice offered to:  Adult Children  DME Arranged:    DME Agency:     HH Arranged:    HH Agency:     Status of Service:  In process, will continue to follow  Medicare Important Message Given:  Yes Date Medicare IM Given:    Medicare IM give by:    Date Additional Medicare IM Given:    Additional Medicare Important Message give by:     If discussed at Long Length of Stay Meetings, dates discussed:    Additional Comments:  Lanier Clam, RN 07/07/2015, 2:56 PM

## 2015-07-07 NOTE — Progress Notes (Signed)
ANTICOAGULATION CONSULT NOTE - F/u Consult  Pharmacy Consult for heparin Indication: chest pain/ACS and atrial fibrillation  Allergies  Allergen Reactions  . Angiotensin Receptor Blockers   . Latex Swelling  . Statins Other (See Comments)    Diffuse cramping; has tried Lipitor, Crestor, Pravachol and simvastatin  . Benicar [Olmesartan] Itching and Rash    Patient Measurements: Height: 5\' 3"  (160 cm) Weight: 173 lb 8 oz (78.7 kg) IBW/kg (Calculated) : 52.4 Heparin Dosing Weight: 63 kg  Vital Signs: Temp: 97.5 F (36.4 C) (02/14 1759) Temp Source: Oral (02/14 1759) BP: 120/98 mmHg (02/14 1800) Pulse Rate: 76 (02/14 1800)  Labs:  Recent Labs  07/05/15 0551  07/06/15 0615 07/07/15 0547 07/07/15 1830  HGB 10.9*  --  10.4* 11.0*  --   HCT 32.9*  --  30.7* 33.1*  --   PLT 202  --  177 202  --   HEPARINUNFRC  --   < > 0.57 1.05* 0.41  CREATININE 3.90*  --  4.36* 4.65*  --   < > = values in this interval not displayed.  Estimated Creatinine Clearance: 10.1 mL/min (by C-G formula based on Cr of 4.65).   Medical History: Past Medical History  Diagnosis Date  . CAD in native artery 2007    a. S/p CABG 2007 (LIMA-LAD, SVG-dRCA, SVG-intermediate, SVG-diagonal). b. Nuclear stress test 08/2012: normal, low risk.  . S/P CABG x 4 2007    LIMA-LAD, SVG-Distal RCA, SVG-Ramus, SVG-Diagonal; no echocardiogram done  . PAD (peripheral artery disease) (HCC) 1997    Mild-moderate carotid disease; status post right SFA occlusion with Fem-Below Knee Pop bypass (Dr. Hart Rochester); LEA Dopplers 07/2014: RIGHT - ABI 0.84, CIA/EIA 50-69%, CFA/PFA patent Fem-Pop bypass patent w/ 70-99% anastomotic stenosis, patent Pop A with 3 V runoff; LABI 0.85- L CIA/EIA patent, LPFA 70-99%, dLSFA 50-69%, patent LPopA & 3 V runoff.  . Renovascular hypertension 12/17/2013    a. s/p L RA Stent 11/2013. b. Renal duplex 02/2015: >60% right proximal renal artery stenosis, normal left renal artery s/p stent, f/u 1 yr  recommended.  . CKD (chronic kidney disease), stage III   . Essential hypertension   . Hyperlipidemia LDL goal <70   . Carotid artery disease (HCC)     a. Mild-mod carotid disease per Dr.Berry's note.  . Anemia     Assessment: Patient's a 80 y.o F presented to the ED on 2/10 with c/o SOB.  Troponin was found to be elevated in the ED and with new onset afib.  To start heparin for Afib and r/o ACS.  Patient reported no recent bleeding events or surgical procedure.  Today, 07/07/2015:  HL previously therapeutic and stable at 1100 units/hr, now 1.05 this AM.  Verified lab drawn correctly and pump running at 11 ml/hr.  No bleeding or infusion issues per patient or RN  CBC stable/improved Plavix stopped by MD, continues on ASA 81 mg Advanced CKD, CrCl stable ~10 ml/min PM UPDATE:  - Heparin level = 0.41 with heparin infusing @ 700 units/hrs  - No complications of therapy noted  Goal of Therapy:  Heparin level 0.3-0.7 units/ml Monitor platelets by anticoagulation protocol: Yes   Plan:   Continue heparin infusion at 700 units/hr  Recheck heparin level 8 hrs to confirm therapeutic dose  Daily CBC and HL while on heparin infusion  F/u plans for transition to PO anticoag. Per cards, poor long-term candidate for [facility].   Terrilee Files, PharmD  07/07/2015, 7:53 PM

## 2015-07-08 ENCOUNTER — Inpatient Hospital Stay (HOSPITAL_COMMUNITY): Payer: Medicare Other

## 2015-07-08 DIAGNOSIS — E785 Hyperlipidemia, unspecified: Secondary | ICD-10-CM

## 2015-07-08 DIAGNOSIS — J9602 Acute respiratory failure with hypercapnia: Secondary | ICD-10-CM

## 2015-07-08 DIAGNOSIS — J9601 Acute respiratory failure with hypoxia: Secondary | ICD-10-CM | POA: Diagnosis present

## 2015-07-08 DIAGNOSIS — I701 Atherosclerosis of renal artery: Secondary | ICD-10-CM

## 2015-07-08 LAB — CBC
HEMATOCRIT: 32.3 % — AB (ref 36.0–46.0)
HEMOGLOBIN: 10.9 g/dL — AB (ref 12.0–15.0)
MCH: 32 pg (ref 26.0–34.0)
MCHC: 33.7 g/dL (ref 30.0–36.0)
MCV: 94.7 fL (ref 78.0–100.0)
PLATELETS: 199 10*3/uL (ref 150–400)
RBC: 3.41 MIL/uL — AB (ref 3.87–5.11)
RDW: 13.1 % (ref 11.5–15.5)
WBC: 8.8 10*3/uL (ref 4.0–10.5)

## 2015-07-08 LAB — CULTURE, BLOOD (ROUTINE X 2)
CULTURE: NO GROWTH
Culture: NO GROWTH

## 2015-07-08 LAB — COMPREHENSIVE METABOLIC PANEL
ALK PHOS: 113 U/L (ref 38–126)
ALT: 778 U/L — AB (ref 14–54)
AST: 502 U/L — AB (ref 15–41)
Albumin: 3.6 g/dL (ref 3.5–5.0)
Anion gap: 17 — ABNORMAL HIGH (ref 5–15)
BILIRUBIN TOTAL: 0.8 mg/dL (ref 0.3–1.2)
BUN: 171 mg/dL — AB (ref 6–20)
CALCIUM: 7.8 mg/dL — AB (ref 8.9–10.3)
CHLORIDE: 90 mmol/L — AB (ref 101–111)
CO2: 21 mmol/L — ABNORMAL LOW (ref 22–32)
CREATININE: 4.68 mg/dL — AB (ref 0.44–1.00)
GFR calc Af Amer: 9 mL/min — ABNORMAL LOW (ref >60)
GFR, EST NON AFRICAN AMERICAN: 8 mL/min — AB (ref >60)
Glucose, Bld: 145 mg/dL — ABNORMAL HIGH (ref 65–99)
Potassium: 4.2 mmol/L (ref 3.5–5.1)
Sodium: 128 mmol/L — ABNORMAL LOW (ref 135–145)
Total Protein: 6.1 g/dL — ABNORMAL LOW (ref 6.5–8.1)

## 2015-07-08 LAB — PHOSPHORUS: PHOSPHORUS: 8.7 mg/dL — AB (ref 2.5–4.6)

## 2015-07-08 LAB — HEPARIN LEVEL (UNFRACTIONATED)
Heparin Unfractionated: 0.32 IU/mL (ref 0.30–0.70)
Heparin Unfractionated: 0.27 IU/mL — ABNORMAL LOW (ref 0.30–0.70)
Heparin Unfractionated: 0.37 IU/mL (ref 0.30–0.70)

## 2015-07-08 LAB — MRSA PCR SCREENING: MRSA by PCR: NEGATIVE

## 2015-07-08 MED ORDER — SODIUM CHLORIDE 0.9% FLUSH: 3.0000 mL | INTRAVENOUS | Status: DC | PRN

## 2015-07-08 MED ORDER — METOPROLOL TARTRATE 25 MG PO TABS
25.0000 mg | ORAL_TABLET | Freq: Once | ORAL | Status: AC
  Administered 2015-07-08: 25 mg via ORAL
  Filled 2015-07-08: qty 1

## 2015-07-08 MED ORDER — DEXTROSE 5 % IV SOLN
100.0000 mg | Freq: Two times a day (BID) | INTRAVENOUS | Status: DC
  Administered 2015-07-08 – 2015-07-09 (×3): 100 mg via INTRAVENOUS
  Filled 2015-07-08 (×5): qty 100

## 2015-07-08 MED ORDER — SODIUM CHLORIDE 0.9 % IV SOLN: 250.0000 mL | INTRAVENOUS | Status: DC | PRN

## 2015-07-08 MED ORDER — CETYLPYRIDINIUM CHLORIDE 0.05 % MT LIQD
7.0000 mL | Freq: Two times a day (BID) | OROMUCOSAL | Status: DC
  Administered 2015-07-08 – 2015-07-18 (×19): 7 mL via OROMUCOSAL

## 2015-07-08 MED ORDER — METOPROLOL TARTRATE 50 MG PO TABS
75.0000 mg | ORAL_TABLET | Freq: Two times a day (BID) | ORAL | Status: DC
  Administered 2015-07-08 – 2015-07-19 (×21): 75 mg via ORAL
  Filled 2015-07-08 (×18): qty 1
  Filled 2015-07-08: qty 2
  Filled 2015-07-08 (×9): qty 1

## 2015-07-08 MED ORDER — SODIUM CHLORIDE 0.9 % IV SOLN: INTRAVENOUS | Status: DC | PRN

## 2015-07-08 MED ORDER — HEPARIN (PORCINE) IN NACL 100-0.45 UNIT/ML-% IJ SOLN
800.0000 [IU]/h | INTRAMUSCULAR | Status: DC
  Filled 2015-07-08: qty 250

## 2015-07-08 MED ORDER — ASPIRIN 81 MG PO CHEW
81.0000 mg | CHEWABLE_TABLET | ORAL | Status: AC
  Administered 2015-07-09: 81 mg via ORAL
  Filled 2015-07-08: qty 1

## 2015-07-08 MED ORDER — SODIUM CHLORIDE 0.9 % IV SOLN
INTRAVENOUS | Status: DC
  Administered 2015-07-09: 06:00:00 via INTRAVENOUS

## 2015-07-08 MED ORDER — SODIUM CHLORIDE 0.9% FLUSH
3.0000 mL | Freq: Two times a day (BID) | INTRAVENOUS | Status: DC
  Administered 2015-07-08: 3 mL via INTRAVENOUS

## 2015-07-08 NOTE — Progress Notes (Signed)
ANTICOAGULATION CONSULT NOTE  Pharmacy Consult for heparin Indication: chest pain/ACS and atrial fibrillation  Allergies  Allergen Reactions  . Angiotensin Receptor Blockers   . Latex Swelling  . Statins Other (See Comments)    Diffuse cramping; has tried Lipitor, Crestor, Pravachol and simvastatin  . Benicar [Olmesartan] Itching and Rash    Patient Measurements: Height: 5\' 3"  (160 cm) Weight: 167 lb 8.8 oz (76 kg) IBW/kg (Calculated) : 52.4 Heparin Dosing Weight: 63 kg  Vital Signs: Temp: 97.4 F (36.3 C) (02/15 1951) Temp Source: Oral (02/15 1951) BP: 139/87 mmHg (02/15 1951) Pulse Rate: 96 (02/15 1951)  Labs:  Recent Labs  07/06/15 0615 07/07/15 0547  07/08/15 0256 07/08/15 1059 07/08/15 2015  HGB 10.4* 11.0*  --  10.9*  --   --   HCT 30.7* 33.1*  --  32.3*  --   --   PLT 177 202  --  199  --   --   HEPARINUNFRC 0.57 1.05*  < > 0.32 0.27* 0.37  CREATININE 4.36* 4.65*  --  4.68*  --   --   < > = values in this interval not displayed.  Estimated Creatinine Clearance: 9.8 mL/min (by C-G formula based on Cr of 4.68).   Medical History: Past Medical History  Diagnosis Date  . CAD in native artery 2007    a. S/p CABG 2007 (LIMA-LAD, SVG-dRCA, SVG-intermediate, SVG-diagonal). b. Nuclear stress test 08/2012: normal, low risk.  . S/P CABG x 4 2007    LIMA-LAD, SVG-Distal RCA, SVG-Ramus, SVG-Diagonal; no echocardiogram done  . PAD (peripheral artery disease) (HCC) 1997    Mild-moderate carotid disease; status post right SFA occlusion with Fem-Below Knee Pop bypass (Dr. Hart Rochester); LEA Dopplers 07/2014: RIGHT - ABI 0.84, CIA/EIA 50-69%, CFA/PFA patent Fem-Pop bypass patent w/ 70-99% anastomotic stenosis, patent Pop A with 3 V runoff; LABI 0.85- L CIA/EIA patent, LPFA 70-99%, dLSFA 50-69%, patent LPopA & 3 V runoff.  . Renovascular hypertension 12/17/2013    a. s/p L RA Stent 11/2013. b. Renal duplex 02/2015: >60% right proximal renal artery stenosis, normal left renal artery  s/p stent, f/u 1 yr recommended.  . CKD (chronic kidney disease), stage III   . Essential hypertension   . Hyperlipidemia LDL goal <70   . Carotid artery disease (HCC)     a. Mild-mod carotid disease per Dr.Berry's note.  . Anemia     Assessment: Patient's a 80 y.o F presented to the ED on 2/10 with c/o SOB.  Troponin was found to be elevated in the ED and with new onset afib.  To start heparin for Afib and r/o ACS.  Patient reported no recent bleeding events or surgical procedure.  Needs heart cath but must agree to HD first.    Today, 07/08/2015:  Heparin level subtherapeutic on 700 units/hr (level was supratherapeutic 2/14). No issues with infusion per RN.  No bleeding noted  CBC stable Plavix stopped by MD, continues on ASA 81 mg Advanced CKD, CrCl stable ~10 ml/min.  Awaiting decision from patient and family re: starting HD.   Goal of Therapy:  Heparin level 0.3-0.7 units/ml Monitor platelets by anticoagulation protocol: Yes   Plan:   Increase heparin to 800 units/hr for low heparin level  Recheck heparin level 8 hrs   Daily CBC and HL while on heparin infusion  F/u plans for transition to PO anticoag. Per cards, poor long-term candidate for [facility].   Addendum   -Evening heparin level therapeutic x1 -Next level with AM  labs   Baldemar Friday  07/08/2015 9:03 PM

## 2015-07-08 NOTE — Progress Notes (Signed)
ANTICOAGULATION CONSULT NOTE - F/u Consult  Pharmacy Consult for heparin Indication: chest pain/ACS and atrial fibrillation  Allergies  Allergen Reactions  . Angiotensin Receptor Blockers   . Latex Swelling  . Statins Other (See Comments)    Diffuse cramping; has tried Lipitor, Crestor, Pravachol and simvastatin  . Benicar [Olmesartan] Itching and Rash    Patient Measurements: Height: 5\' 3"  (160 cm) Weight: 173 lb 8 oz (78.7 kg) IBW/kg (Calculated) : 52.4 Heparin Dosing Weight: 63 kg  Vital Signs: Temp: 97.8 F (36.6 C) (02/14 1958) Temp Source: Oral (02/14 1958) BP: 157/90 mmHg (02/14 2200) Pulse Rate: 106 (02/14 2200)  Labs:  Recent Labs  07/06/15 0615 07/07/15 0547 07/07/15 1830 07/08/15 0256  HGB 10.4* 11.0*  --  10.9*  HCT 30.7* 33.1*  --  32.3*  PLT 177 202  --  199  HEPARINUNFRC 0.57 1.05* 0.41 0.32  CREATININE 4.36* 4.65*  --  4.68*    Estimated Creatinine Clearance: 10 mL/min (by C-G formula based on Cr of 4.68).   Medical History: Past Medical History  Diagnosis Date  . CAD in native artery 2007    a. S/p CABG 2007 (LIMA-LAD, SVG-dRCA, SVG-intermediate, SVG-diagonal). b. Nuclear stress test 08/2012: normal, low risk.  . S/P CABG x 4 2007    LIMA-LAD, SVG-Distal RCA, SVG-Ramus, SVG-Diagonal; no echocardiogram done  . PAD (peripheral artery disease) (HCC) 1997    Mild-moderate carotid disease; status post right SFA occlusion with Fem-Below Knee Pop bypass (Dr. Hart Rochester); LEA Dopplers 07/2014: RIGHT - ABI 0.84, CIA/EIA 50-69%, CFA/PFA patent Fem-Pop bypass patent w/ 70-99% anastomotic stenosis, patent Pop A with 3 V runoff; LABI 0.85- L CIA/EIA patent, LPFA 70-99%, dLSFA 50-69%, patent LPopA & 3 V runoff.  . Renovascular hypertension 12/17/2013    a. s/p L RA Stent 11/2013. b. Renal duplex 02/2015: >60% right proximal renal artery stenosis, normal left renal artery s/p stent, f/u 1 yr recommended.  . CKD (chronic kidney disease), stage III   . Essential  hypertension   . Hyperlipidemia LDL goal <70   . Carotid artery disease (HCC)     a. Mild-mod carotid disease per Dr.Berry's note.  . Anemia     Assessment: Patient's a 80 y.o F presented to the ED on 2/10 with c/o SOB.  Troponin was found to be elevated in the ED and with new onset afib.  To start heparin for Afib and r/o ACS.  Patient reported no recent bleeding events or surgical procedure.  Today, 07/08/2015:  HL previously therapeutic and stable at 1100 units/hr, now 1.05 this AM.  Verified lab drawn correctly and pump running at 11 ml/hr.  No bleeding or infusion issues per patient or RN  CBC stable/improved Plavix stopped by MD, continues on ASA 81 mg Advanced CKD, CrCl stable ~10 ml/min 2/14  - Heparin level = 0.41 with heparin infusing @ 700 units/hrs  - No complications of therapy noted Today, 2/15    0256 HL=0.32, no problems reported with infusion or bleeding.  Goal of Therapy:  Heparin level 0.3-0.7 units/ml Monitor platelets by anticoagulation protocol: Yes   Plan:   Continue heparin infusion at 700 units/hr  Will recheck later today to ensure stays in range since trending down to low end of therapeutic range  Daily CBC and HL while on heparin infusion  F/u plans for transition to PO anticoag. Per cards, poor long-term candidate for [facility].   Lorenza Evangelist 07/08/2015, 4:04 AM

## 2015-07-08 NOTE — Progress Notes (Signed)
Subjective: Breathing better.  Now CP.  Dizzy when she stands up.  Objective: Vital signs in last 24 hours: Temp:  [96.7 F (35.9 C)-97.8 F (36.6 C)] 96.7 F (35.9 C) (02/15 1636) Pulse Rate:  [51-141] 103 (02/15 1645) Resp:  [12-20] 14 (02/15 1645) BP: (134-174)/(84-117) 141/105 mmHg (02/15 1645) SpO2:  [94 %-100 %] 98 % (02/15 1645) Weight:  [167 lb 8.8 oz (76 kg)] 167 lb 8.8 oz (76 kg) (02/15 1600) Last BM Date: 07/08/15  Intake/Output from previous day: 02/14 0701 - 02/15 0700 In: 456.4 [P.O.:250; I.V.:206.4] Out: 650 [Urine:650] Intake/Output this shift:    Medications Scheduled Meds: . antiseptic oral rinse  7 mL Mouth Rinse BID  . aspirin EC  81 mg Oral Daily  . diphenhydrAMINE  50 mg Oral Once  . doxycycline (VIBRAMYCIN) IV  100 mg Intravenous Q12H  . folic acid  1 mg Oral Daily  . furosemide  80 mg Intravenous Q12H  . levalbuterol  0.63 mg Nebulization BID  . metoprolol  75 mg Oral BID  . multivitamin with minerals  1 tablet Oral Daily  . pantoprazole  40 mg Oral Daily  . predniSONE  60 mg Oral QAC breakfast  . sodium chloride flush  3 mL Intravenous Q12H  . thiamine  100 mg Oral Daily   Or  . thiamine  100 mg Intravenous Daily   Continuous Infusions: . heparin 800 Units/hr (07/08/15 1228)   PRN Meds:.sodium chloride, acetaminophen **OR** acetaminophen, levalbuterol, LORazepam, ondansetron **OR** ondansetron (ZOFRAN) IV  PE: General appearance: alert, cooperative and no distress Neck: Appears elevated Lungs: clear to auscultation bilaterally Heart: irregularly irregular rhythm Abdomen: +BS, Nontender Extremities: No LEE Pulses: 2+ and symmetric Skin: Warm and dry Neurologic: Grossly normal, generalized weakness.    Lab Results:   Recent Labs  07/06/15 0615 07/07/15 0547 07/08/15 0256  WBC 8.2 8.7 8.8  HGB 10.4* 11.0* 10.9*  HCT 30.7* 33.1* 32.3*  PLT 177 202 199   BMET  Recent Labs  07/06/15 0615 07/07/15 0547 07/08/15 0256    NA 128* 130* 128*  K 3.9 4.2 4.2  CL 91* 91* 90*  CO2 20* 21* 21*  GLUCOSE 130* 141* 145*  BUN 158* 158* 171*  CREATININE 4.36* 4.65* 4.68*  CALCIUM 7.5* 7.9* 7.8*    Assessment/Plan  80 yo female with a history of CAD s/p CABG 2007 (LIMA-LAD, SVG-dRCA, SVG-intermediate, SVG-diagonal, neg nuc 2014), EtOH abuse, PVD s/p right femoropopliteal bypass grafting, renal artery stenosis s/p L renal stent 11/2013 (residual RAS being followed for now), reported mild-mod carotid artery disease, CKD stage III, chronic-appearing anemia, former tobacco abuse admitted with fever, cough, acute kidney injury on CKD (BUN 93/Cr 3.64 - previously 2 range), newly recognized atrial fib (rates 90-110 on admission), marked transaminitis, lactic acidosis, elevated BNP and initial troponin 1.09. Patient also initially reported 30 lb weight loss but actual weights in computer (beside stated weight) congruent with prior.  2D Echo 07/05/15: EF 20-25%, diffuse HK, akinesis of anteroseptal and inferolateral myocardium, mild AI, mod MR, severely dilated LA, mildly dilated RA, mod reduced RV function. (Prior EF by nuc in 2014 was 63%).  Principal Problem:   Acute respiratory failure with hypoxia and hypercarbia (HCC) Active Problems:   CAD in native artery - CABG x4 (LIMA-LAD, SVG-distal RCA, SVG-diagonal, SVG-Ramus   Chronic renal insufficiency, stage III (moderate)   Acute on chronic renal failure (HCC)   Elevated troponin   Persistent atrial fibrillation (HCC)   New onset atrial  fibrillation (HCC)   Congestive dilated cardiomyopathy (HCC)   Cardiorenal syndrome with renal failure   Hypertension, essential   Hyperlipidemia with target LDL less than 70   Renal artery stenosis, native, bilateral (HCC)   Transaminitis   Dyspnea   Unintentional weight loss   Lactic acidosis   Sepsis (HCC)   Community acquired pneumonia   Shortness of breath   SOB (shortness of breath)   Hypothermia  Cough/fever/SOB/wheezing  possibly due to combination of PNA.  Hypothermia on 07/07/15, unclear cause. Presentation initially suspected pulmonary & improved with steroids/abx, but renal was also concerned for component of acute systolic CHF in the setting of acute renal failure with new low EF. Diuretics being managed by renal.   IV given today.  Does not appear significantly volume overloaded on exam but JVD appears elevated.   Not a candidate for ACEI/ARB/spiro with AKI. Would consolidate Lopressor to Toprol closer to d/c. Etiology of LV dysfunction not clear - top ddx includes ischemia,  afib, alcohol abuse, or viral. See below.   Planning right and left heart caths.   Acute combined systolic and diastolic CHF  Net fluids: -0.2L.  CT Chest this morning:  "Advanced atherosclerosis of the aorta and its branch vessels. Probable mild edema/fluid overload. Small effusions layering dependently, left larger than right.  Bilateral tree in bud opacities usually indicative of bronchopneumonia with atypical organisms such as mycobacterium.  Differential diagnosis does include an early manifestation of edema and micronodular metastatic disease, but those are felt less likely."  AKI on CKD stage III - BUN/Cr high but stable 171/4.68. Nephrology following.  Elevated troponin - Peak trop 1.09.  The patient is agreeable to having a cardiac cath knowing that she will likely need dialysis.  With ? EF by echo 20-25%, There is akinesis of the anteroseptal and inferolateral myocardium. Continue ASA. She denies any recent angina.    Right heart cath in AM first (with sheath exchange for Tiralysis Catheter).  Once HD set up and she can lay flat, can potentially consider LHC/Angio    Newly recognized atrial fibrillation -  Rate controlled.  TSH normal. Continue rate control. On lopressor 50 twice daily.  IV heparin.  Not currently a candidate for DOAC with renal dysfunction. With EtOH abuse she may not be a good long-term anticoagulation  candidate.  Per Dr. Ludwig Clarks note, continue heparin per pharmacy and transition to Coumadin later pending renal function with plan for DCCV (? As in vs. Out patient) if atrial fibrillation persists. Afib could also be related to acute illness so as she recovers could treat with short term anticoagulation and then proceed with DCCV to see if she holds sinus and then DC anticoagulation 4 weeks later. Dr. Herbie Baltimore is concerned about potential use of warfarin as well, however, given elevated LFTs. Will need to follow.  Left Atrium is severely dilated which will likely make successful conversion less likely.   EtOH abuse - counseled regarding cessation. On CIWA.   Acute transaminitis -  felt possibly related to alcohol abuse, but levels persist. Abd [place] increased heterogeneous hepatic echotexture nonspecific but may represent hepatic steatosis. Acute hep panel neg. Per IM, they discussed case with Dr. Loreta Ave who recommended discontinuing Praluent with outpatient f/u.  NSVT 16 beat run last night. Increase lopressor to 75 twice daily    LOS: 5 days    HAGER, BRYAN PA-C 07/08/2015 7:09 PM  I saw evaluated the patient today after she was transferred to [facility]. I agree  with Bryan's findings, assessment and plan as noted above. We discussed the plan prior to her transfer.  Overall she seems clinically improved, and her creatinine seems to have now plateaued. She does continue to have urine output, so she is not anuric but likely oliguric.  LFTs are now starting to trend down. Sodium is slightly lower.  On exam today she does have a little JVD with hepatojugular reflux.  I asked Dr. Gala Romney from heart failure service to evaluate the patient with me this afternoon. The most difficult issue here is that we're trying to determine her hemodynamic/volume status.  She has new onset heart failure associated with acute hepatic and renal failure that due to an unexplained etiology. She has known  CAD with history of CABG. She has known renal artery stenosis on Doppler exams in the past.  She has newly diagnosed atrial fibrillation, that is rate controlled. I do agree with increasing the beta blocker as there is a short run of nonsustained VT on telemetry. This could've been apparent see, but just to be sure increasing beta blockers is warranted.  I discussed the case with Dr. Gala Romney and he felt that we need to determine the volume status.  Dr. Gala Romney recommended right heart catheterization in the cardiac catheterization lab tomorrow followed by switching out the Swan sheath for a Trialysis catheter (he is working to adjust his schedule in order to perform this in the morning) - if unable to do so, can continue with VIR for  dialysis catheter  Dr. Gala Romney will likely be the one performing the procedure, as he is comfortable with exchanging sheaths for the dialysis catheter. He will therefore know how to best interpret the results. Depending on the findings, we may actually ask the advanced heart failure service to take over primary management of her cardiology/heart failure issues.  We would like to proceed with a right heart catheterization first in order to assess her volume status as it is unclear as well as her hemodynamics and cardiac output. The concern here is that she may have low output failure with reduced EF that is exacerbated by A. fib. This could be leading to cardiorenal syndrome plus or minus congestive hepatopathy.   She does have a new decrease in EF, and would benefit from ischemic evaluation. Based on the positive troponins (not sure if this is a true non-STEMI versus demand ischemia - as age: Of one is unlikely to cause a significant drop in EF) -- I would like to consider left heart catheterization, however we need to ensure that her renal function is stable post-minus improving.   The intention would be to avoid potential of going from chronic renal insufficiency  to end-stage renal insufficiency requiring long-term dialysis which she does not want.   Once we are sure of the renal function plus or minus the ability to dialyze out contrast, we can consider coronary and graft angiography with potentially staged PCI    She continues to be on IV heparin which can be held for procedures. This is for combination elevation of cardiac enzymes as well as A. fib. Her A. fib rate is controlled with current dose of beta blocker   Prior to determining the appropriate anticoagulation strategy for long-term, it would be beneficial to know her coronary anatomy  Pending the right heart catheterization numbers, we may determine that she would benefit from TEE guided cardioversion. Potentially restoring sinus rhythm could improve cardiac output.     Marykay Lex, M.D., M.S.  Interventional Cardiologist   Pager # [phone] Phone # [phone] [address] [place], [place] [address]

## 2015-07-08 NOTE — Progress Notes (Signed)
ANTICOAGULATION CONSULT NOTE - F/u Consult  Pharmacy Consult for heparin Indication: chest pain/ACS and atrial fibrillation  Allergies  Allergen Reactions  . Angiotensin Receptor Blockers   . Latex Swelling  . Statins Other (See Comments)    Diffuse cramping; has tried Lipitor, Crestor, Pravachol and simvastatin  . Benicar [Olmesartan] Itching and Rash    Patient Measurements: Height: 5\' 3"  (160 cm) Weight: 173 lb 8 oz (78.7 kg) IBW/kg (Calculated) : 52.4 Heparin Dosing Weight: 63 kg  Vital Signs: Temp: 97.4 F (36.3 C) (02/15 1000) BP: 160/117 mmHg (02/15 0800) Pulse Rate: 99 (02/15 0800)  Labs:  Recent Labs  07/06/15 0615 07/07/15 0547 07/07/15 1830 07/08/15 0256 07/08/15 1059  HGB 10.4* 11.0*  --  10.9*  --   HCT 30.7* 33.1*  --  32.3*  --   PLT 177 202  --  199  --   HEPARINUNFRC 0.57 1.05* 0.41 0.32 0.27*  CREATININE 4.36* 4.65*  --  4.68*  --     Estimated Creatinine Clearance: 10 mL/min (by C-G formula based on Cr of 4.68).   Medical History: Past Medical History  Diagnosis Date  . CAD in native artery 2007    a. S/p CABG 2007 (LIMA-LAD, SVG-dRCA, SVG-intermediate, SVG-diagonal). b. Nuclear stress test 08/2012: normal, low risk.  . S/P CABG x 4 2007    LIMA-LAD, SVG-Distal RCA, SVG-Ramus, SVG-Diagonal; no echocardiogram done  . PAD (peripheral artery disease) (HCC) 1997    Mild-moderate carotid disease; status post right SFA occlusion with Fem-Below Knee Pop bypass (Dr. Hart Rochester); LEA Dopplers 07/2014: RIGHT - ABI 0.84, CIA/EIA 50-69%, CFA/PFA patent Fem-Pop bypass patent w/ 70-99% anastomotic stenosis, patent Pop A with 3 V runoff; LABI 0.85- L CIA/EIA patent, LPFA 70-99%, dLSFA 50-69%, patent LPopA & 3 V runoff.  . Renovascular hypertension 12/17/2013    a. s/p L RA Stent 11/2013. b. Renal duplex 02/2015: >60% right proximal renal artery stenosis, normal left renal artery s/p stent, f/u 1 yr recommended.  . CKD (chronic kidney disease), stage III   .  Essential hypertension   . Hyperlipidemia LDL goal <70   . Carotid artery disease (HCC)     a. Mild-mod carotid disease per Dr.Berry's note.  . Anemia     Assessment: Patient's a 80 y.o F presented to the ED on 2/10 with c/o SOB.  Troponin was found to be elevated in the ED and with new onset afib.  To start heparin for Afib and r/o ACS.  Patient reported no recent bleeding events or surgical procedure.  Needs heart cath but must agree to HD first.    Today, 07/08/2015:  Heparin level subtherapeutic on 700 units/hr (level was supratherapeutic 2/14). No issues with infusion per RN.  No bleeding noted  CBC stable Plavix stopped by MD, continues on ASA 81 mg Advanced CKD, CrCl stable ~10 ml/min.  Awaiting decision from patient and family re: starting HD.   Goal of Therapy:  Heparin level 0.3-0.7 units/ml Monitor platelets by anticoagulation protocol: Yes   Plan:   Increase heparin to 800 units/hr for low heparin level  Recheck heparin level 8 hrs   Daily CBC and HL while on heparin infusion  F/u plans for transition to PO anticoag. Per cards, poor long-term candidate for [facility].  Juliette Alcide, PharmD, BCPS.   Pager: [phone] 07/08/2015 11:39 AM

## 2015-07-08 NOTE — Progress Notes (Addendum)
[facility] PROGRESS NOTE  [name]:[id number] DOB: 1935/07/02 DOA: 07/03/2015 PCP: Michiel Sites, MD  Summary I've seen and examined Ms. Gotto at bedside and reviewed her chart. Appreciate cardiology/nephrology. Spoke with Dr Eliott Nine. Tonna Boehringer is a very pleasant 80 year old female with multiple medical problems including CAD status post CABG/essential hypertension/CKD stage III, among other medical problems who came in with cough/shortness of breath for 1 week and she has since been found to have Acute hypercapnic/hypoxic respiratory failure apparently of multifactorial origin- pulmonary vascular congestion which is related to acute systolic CHF(? Ischemic cardiomyopathy, EF 20%), Acute kidney injury on chronic CKD stage 3 causing fluid retention, afib/nstemi, bronchopneumonia/Pleural Effusion(CT chest today showed "Advanced atherosclerosis of the aorta and its branch vessels. Probable mild edema/fluid overload. Small effusions layering dependently, left larger than right. Bilateral tree in bud opacities usually indicative of bronchopneumonia with atypical organisms such as mycobacterium. Differential diagnosis does include an early manifestation of edema and micronodular metastatic disease, but those are felt less likely". Patient received antibiotics(?Ceftin in recent weeks), and was started on antibiotics at admission but these were held, so we will start Doxycycline for now-may need to broaden antibiotics depending on clinical progress. Ms. Blevins needs cardiac catheterization but will need renal replacement therapy prior to cardiac cath hence she will be transferred to [facility] for HD/Cardiac cath(SDU, spoke with Dr Sharon Seller). She is currently on heparin. Will defer further management to nephrology/cardiology. Plan Acute respiratory failure with hypoxia and hypercarbia (HCC)/pleural effusion/Dyspnea/Lactic acidosis/Sepsis (HCC)/Community acquired pneumonia/SOB  (shortness of breath)/Hypothermia  Doxycycline  Oxygen supplementation/systemic steroids/bronchodilators  Consider ID/pulmonary input depending on clinical progress Congestive dilated cardiomyopathy, acute (HCC)/CAD in native artery - CABG x4 (LIMA-LAD, SVG-distal RCA, SVG-diagonal, SVG-Ramus/Hypertension, essential/Transaminitis/Elevated troponin/NSTEMI (non-ST elevated myocardial infarction) (HCC)/New onset atrial fibrillation (HCC)  Defer management to cardiology- ?cardiac cath  Patient on aspirin/Lopressor/Lasix/Heparin Cardiorenal syndrome with renal failure/Chronic renal insufficiency, stage III (moderate)/Acute on chronic renal failure (HCC)  Defer management to nephrology  Follow renal replacement therapy Unintentional weight loss  Significance not clear, monitor for now   Code Status: Full Code Family Communication: husband, daughter and son at the bedside. Disposition Plan: To [facility], SDU when bed becomes available  Consultants:  Cardiology  Nephrology  Procedures:  For HD  Antibiotics:  Doxycycline 07/08/15>>  HPI/Subjective: "Ok", jovial. Asks about her prognosis.  Objective: Filed Vitals:   07/08/15 1000 07/08/15 1200  BP:  159/84  Pulse:    Temp: 97.4 F (36.3 C)   Resp:  20    Intake/Output Summary (Last 24 hours) at 07/08/15 1327 Last data filed at 07/08/15 0900  Gross per 24 hour  Intake 576.37 ml  Output    650 ml  Net -73.63 ml   Filed Weights   07/06/15 0456 07/07/15 0543 07/07/15 1607  Weight: 78.699 kg (173 lb 8 oz) 78.019 kg (172 lb) 78.7 kg (173 lb 8 oz)    Exam:   General:  Comfortable at rest.  Cardiovascular: S1-S2 normal. No murmurs. Pulse regular.  Respiratory: Good air entry bilaterally. Bilateral rhonchi/rales.  Abdomen: Soft and nontender. Normal bowel sounds. No organomegaly.  Musculoskeletal: No pedal edema   Neurological: Intact  Data Reviewed: Basic Metabolic Panel:  Recent Labs Lab  07/04/15 0427 07/05/15 0551 07/06/15 0615 07/07/15 0547 07/08/15 0256  NA 131* 130* 128* 130* 128*  K 4.3 3.8 3.9 4.2 4.2  CL 94* 92* 91* 91* 90*  CO2 17* 21* 20* 21* 21*  GLUCOSE 124* 143* 130* 141* 145*  BUN 111* 132* 158* 158* 171*  CREATININE 3.37* 3.90* 4.36* 4.65* 4.68*  CALCIUM 7.5* 7.7* 7.5* 7.9* 7.8*  MG  --   --  2.2  --   --   PHOS  --   --   --  8.5* 8.7*   Liver Function Tests:  Recent Labs Lab 07/04/15 0427 07/05/15 0551 07/06/15 0615 07/07/15 0547 07/08/15 0256  AST 647* 802* 695* 612* 502*  ALT 538* 695* 722* 815* 778*  ALKPHOS 116 119 119 126 113  BILITOT 0.9 0.9 1.1 0.8 0.8  PROT 6.4* 5.9* 5.6* 6.3* 6.1*  ALBUMIN 3.6 3.3* 3.2* 3.7 3.6   No results for input(s): LIPASE, AMYLASE in the last 168 hours. No results for input(s): AMMONIA in the last 168 hours. CBC:  Recent Labs Lab 07/03/15 1236 07/04/15 0427 07/05/15 0551 07/06/15 0615 07/07/15 0547 07/08/15 0256  WBC 7.7 8.2 7.9 8.2 8.7 8.8  NEUTROABS 5.8  --   --   --   --   --   HGB 10.3* 12.0 10.9* 10.4* 11.0* 10.9*  HCT 30.4* 36.6 32.9* 30.7* 33.1* 32.3*  MCV 96.2 97.1 96.5 95.6 95.4 94.7  PLT 186 205 202 177 202 199   Cardiac Enzymes:  Recent Labs Lab 07/03/15 1236 07/03/15 1603 07/03/15 2247 07/04/15 0425 07/04/15 0952  CKTOTAL  --   --   --   --  318*  TROPONINI 1.09* 0.85* 0.54* 0.45*  --    BNP (last 3 results)  Recent Labs  07/03/15 1236  BNP 4223.3*    ProBNP (last 3 results) No results for input(s): PROBNP in the last 8760 hours.  CBG: No results for input(s): GLUCAP in the last 168 hours.  Recent Results (from the past 240 hour(s))  Culture, blood (routine x 2)     Status: None   Collection Time: 07/03/15 12:32 PM  Result Value Ref Range Status   Specimen Description BLOOD RIGHT ANTECUBITAL  Final   Special Requests BOTTLES DRAWN AEROBIC AND ANAEROBIC 5CC  Final   Culture   Final    NO GROWTH 5 DAYS Performed at [facility]    Report Status  07/08/2015 FINAL  Final  Culture, blood (routine x 2)     Status: None   Collection Time: 07/03/15 12:35 PM  Result Value Ref Range Status   Specimen Description BLOOD LEFT ANTECUBITAL  Final   Special Requests BOTTLES DRAWN AEROBIC AND ANAEROBIC 5CC  Final   Culture   Final    NO GROWTH 5 DAYS Performed at [facility]    Report Status 07/08/2015 FINAL  Final  Culture, blood (Routine X 2) w Reflex to ID Panel     Status: None (Preliminary result)   Collection Time: 07/07/15  8:38 AM  Result Value Ref Range Status   Specimen Description BLOOD RIGHT ARM  Final   Special Requests BOTTLES DRAWN AEROBIC AND ANAEROBIC 5CC  Final   Culture   Final    NO GROWTH 1 DAY Performed at [facility]    Report Status PENDING  Incomplete  Culture, blood (Routine X 2) w Reflex to ID Panel     Status: None (Preliminary result)   Collection Time: 07/07/15  8:47 AM  Result Value Ref Range Status   Specimen Description BLOOD RIGHT HAND  Final   Special Requests IN PEDIATRIC BOTTLE 3CC  Final   Culture   Final    NO GROWTH 1 DAY Performed at [facility]  Report Status PENDING  Incomplete  MRSA PCR Screening     Status: None   Collection Time: 07/08/15  7:54 AM  Result Value Ref Range Status   MRSA by PCR NEGATIVE NEGATIVE Final    Comment:        The GeneXpert MRSA Assay (FDA approved for NASAL specimens only), is one component of a comprehensive MRSA colonization surveillance program. It is not intended to diagnose MRSA infection nor to guide or monitor treatment for MRSA infections.      Studies: Ct Abdomen Pelvis Wo Contrast  07/07/2015  CLINICAL DATA:  Small bowel obstruction. Perforated viscus. Abnormal x-rays appear EXAM: CT ABDOMEN AND PELVIS WITHOUT CONTRAST TECHNIQUE: Multidetector CT imaging of the abdomen and pelvis was performed following the standard protocol without IV contrast. COMPARISON:  Plain films performed today. FINDINGS: Mild cardiomegaly.  Small left pleural effusion and trace right effusion. Minimal dependent atelectasis in the lung bases. Liver, gallbladder, spleen, pancreas, adrenals have an unremarkable unenhanced appearance. Vascular calcifications in the renal hila bilaterally. No ureteral stones or hydronephrosis. Urinary bladder is unremarkable. Scattered colonic diverticula.  No active diverticulitis. The previously seen small bowel prominence is less evident by CT. There is mild prominence of upper abdominal small bowel loops with decompressed distal small bowel loops. Contrast material has made it into the right side of the colon. I do not see any transition point. No CT evidence for obstruction. Aorta and iliac vessels are heavily calcified, non aneurysmal. Prior hysterectomy. No adnexal masses. No free fluid, free air or adenopathy. No acute bony abnormality or focal bone lesion. IMPRESSION: No CT evidence for small bowel obstruction. No evidence of free air as questioned on plain films. Scattered colonic diverticulosis.  No active diverticulitis. Small left pleural effusion.  Trace right pleural effusion. Electronically Signed   By: Charlett Nose M.D.   On: 07/07/2015 16:01   Ct Chest Wo Contrast  07/08/2015  CLINICAL DATA:  Shortness of breath. EXAM: CT CHEST WITHOUT CONTRAST TECHNIQUE: Multidetector CT imaging of the chest was performed following the standard protocol without IV contrast. COMPARISON:  Radiography an abdominal CT 07/07/2015 FINDINGS: There is a small amount of pleural fluid layering dependently, more on the left than the right. There is no pericardial fluid. There is atherosclerosis of the aorta and its branch vessels including the coronary arteries. No enlarged hilar or mediastinal lymph nodes are visible. Mild prominence of the septal lines in the lungs could be associated with mild edema/ fluid overload. No advanced pulmonary edema. Micronodular opacities within both lungs, probably tree in bud opacities, most  likely to reflect bronchopneumonia with atypical organisms such as mycobacterium. The differential diagnosis does include an early manifestation of edema and micronodular metastatic disease, but those are felt less likely. Scans in the upper abdomen do not show any acute finding. IMPRESSION: Advanced atherosclerosis of the aorta and its branch vessels. Probable mild edema/fluid overload. Small effusions layering dependently, left larger than right. Bilateral tree in bud opacities usually indicative of bronchopneumonia with atypical organisms such as mycobacterium. Differential diagnosis does include an early manifestation of edema and micronodular metastatic disease, but those are felt less likely. Electronically Signed   By: Paulina Fusi M.D.   On: 07/08/2015 10:45   Dg Abd Acute W/chest  07/07/2015  CLINICAL DATA:  Hypothermia, history of CABG, former smoking history EXAM: DG ABDOMEN ACUTE W/ 1V CHEST COMPARISON:  Chest x-ray of 07/05/2015 and ultrasound of the abdomen of 07/04/2015 FINDINGS: No active infiltrate or effusion is  seen. Cardiomegaly is stable. Median sternotomy sutures are noted from prior CABG. Supine and left lateral decubitus films of the abdomen were obtained. Better seen on the supine film there are somewhat distended loops of small bowel, and the colon and distal small bowel appears decompressed. A partial small bowel obstruction is a definite consideration. On the left lower decubitus films, there is some lucency just above the right lobe of liver, and a small amount of free intraperitoneal air cannot be excluded. CT the abdomen pelvis is recommended to assess for possible free air as well as evaluate the possibility of partial small bowel obstruction. No opaque calculi are seen. The bones are osteopenic. IMPRESSION: 1. Prominent loops of small bowel may indicate a partial small bowel obstruction. 2. Cannot exclude a small amount of free air on the decubitus film. In view of these findings,  CT of the abdomen pelvis at this time is recommended. 3. No active lung disease.  Cardiomegaly. I spoke with Dr. Ramiro Harvest concerning the findings of this study at 12:51 p.m. Electronically Signed   By: Dwyane Dee M.D.   On: 07/07/2015 12:52    Scheduled Meds: . antiseptic oral rinse  7 mL Mouth Rinse BID  . aspirin EC  81 mg Oral Daily  . diphenhydrAMINE  50 mg Oral Once  . doxycycline (VIBRAMYCIN) IV  100 mg Intravenous Q12H  . folic acid  1 mg Oral Daily  . furosemide  80 mg Intravenous Q12H  . levalbuterol  0.63 mg Nebulization BID  . metoprolol  75 mg Oral BID  . multivitamin with minerals  1 tablet Oral Daily  . pantoprazole  40 mg Oral Daily  . predniSONE  60 mg Oral QAC breakfast  . sodium chloride flush  3 mL Intravenous Q12H  . thiamine  100 mg Oral Daily   Or  . thiamine  100 mg Intravenous Daily   Continuous Infusions: . heparin 800 Units/hr (07/08/15 1228)     Time spent: 25 minutes    Lyle Leisner  [facility] Pager [phone]. If 7PM-7AM, please contact night-coverage at [email or web address], password [facility] 07/08/2015, 1:27 PM  LOS: 5 days

## 2015-07-08 NOTE — Progress Notes (Signed)
[facility] Progress Note   Subjective:  Moved to ICU stepdown Breathing is stable Had some CP yesterday Has now agreed to dialysis (and this will alloow heart cath to be done) Will need to be moved to [facility] for all this  CT yesterday showed no intrabdominal pathology so no surgical issue  Lasix started back yesterday - not a lot of output Creatinine is 4.71 BUN now in the 170's  Filed Vitals:   07/08/15 0800 07/08/15 0826 07/08/15 1000 07/08/15 1200  BP: 160/117   159/84  Pulse: 99     Temp: 96.8 F (36 C)  97.4 F (36.3 C)   TempSrc:      Resp: 14   20  Height:      Weight:      SpO2: 100% 100%  99%   Exam: Alert no distress Chest ant clear  Irreg irreg rhythm no MRG Abd obese, soft ntnd no hsm noted  MS no joint chgs There is no edema of LE's Neuro nonfocal N asterixis, Ox 3  Inpatient medications: . antiseptic oral rinse  7 mL Mouth Rinse BID  . aspirin EC  81 mg Oral Daily  . diphenhydrAMINE  50 mg Oral Once  . doxycycline (VIBRAMYCIN) IV  100 mg Intravenous Q12H  . folic acid  1 mg Oral Daily  . furosemide  80 mg Intravenous Q12H  . levalbuterol  0.63 mg Nebulization BID  . metoprolol  75 mg Oral BID  . multivitamin with minerals  1 tablet Oral Daily  . pantoprazole  40 mg Oral Daily  . predniSONE  60 mg Oral QAC breakfast  . sodium chloride flush  3 mL Intravenous Q12H  . thiamine  100 mg Oral Daily   Or  . thiamine  100 mg Intravenous Daily   Infusions . heparin 800 Units/hr (07/08/15 1228)   Prn medicines acetaminophen **OR** acetaminophen, levalbuterol, LORazepam, ondansetron **OR** ondansetron (ZOFRAN) IV   Recent Labs Lab 07/06/15 0615 07/07/15 0547 07/08/15 0256  NA 128* 130* 128*  K 3.9 4.2 4.2  CL 91* 91* 90*  CO2 20* 21* 21*  GLUCOSE 130* 141* 145*  BUN 158* 158* 171*  CREATININE 4.36* 4.65* 4.68*  CALCIUM 7.5* 7.9* 7.8*  PHOS  --  8.5* 8.7*    Recent Labs Lab 07/06/15 0615 07/07/15 0547 07/08/15 0256   AST 695* 612* 502*  ALT 722* 815* 778*  ALKPHOS 119 126 113  BILITOT 1.1 0.8 0.8  PROT 5.6* 6.3* 6.1*  ALBUMIN 3.2* 3.7 3.6    Recent Labs Lab 07/03/15 1236  07/06/15 0615 07/07/15 0547 07/08/15 0256  WBC 7.7  < > 8.2 8.7 8.8  NEUTROABS 5.8  --   --   --   --   HGB 10.3*  < > 10.4* 11.0* 10.9*  HCT 30.4*  < > 30.7* 33.1* 32.3*  MCV 96.2  < > 95.6 95.4 94.7  PLT 186  < > 177 202 199  < > = values in this interval not displayed.   Assessment: 1. SOB/ cough/ hypoxemia - improved initially with diuresis. Very poor LV function (EF 20-25%), and new afib. Diuretics held for a day, restarted yesterday. CT of chest today edema, small effusions, changes of bronchopneumonia. Doxy added by primary team 2. CKD4-> now CKD 5 (AKI on CKD) diuresis for CHF, very low EF, new AF - cardiorenal component. Bump in creat from baseline mid 2's.  BUN/Cr worse again today. Creatinine has increased 3.9->4.36->4.65, BUN 132->158->170's (steroids likely  playing a role in the latter).  Lasix held for 24 hours without effect and restarted yesterday. Has decided with family on a trial of dialysis (and this will allow heart cath) Understands could be permanent but that she will always have option of stopping. Will need to transfer to [facility], ask IR to place a temporary HD cath.   3. CAD hx CABG with severe dilated / CM EF 20-25% - heart cath once dialysis lined up and would like to get a treatment or 2 done for volume and azotemia in the short run 4. New onset afib/ aflutter - per primary/cards 5. Pneumoperitoneum -CT was negative.  6. HTN meds 7. HL previously on alirocumab (Praluent) injections - Dr. Loreta Ave has recommended stopping 8. ID - no fever. CT chest changes of bronchopneumonaia (and they wonder about atypicals). On doxy 9. ^LFT's - etoh vs congestion. AST/ALT remain high  have not fallen to any great degree.   10. Dispo - transfer to [facility], temp HD cath (IR will place early AM 2/16), dialysis after , cards to  schedule heart cath. Discussed with Dr. Venetia Constable who will facilitate the transfer  Camille Bal, MD [facility] [phone] Pager 07/08/2015, 12:55 PM

## 2015-07-09 ENCOUNTER — Encounter (HOSPITAL_COMMUNITY)
Admission: EM | Disposition: A | Discharge status: Home Health | Payer: Self-pay | Source: Home / Self Care | Attending: Internal Medicine

## 2015-07-09 ENCOUNTER — Encounter (HOSPITAL_COMMUNITY): Payer: Self-pay | Admitting: Internal Medicine

## 2015-07-09 DIAGNOSIS — I472 Ventricular tachycardia: Secondary | ICD-10-CM

## 2015-07-09 DIAGNOSIS — I5023 Acute on chronic systolic (congestive) heart failure: Secondary | ICD-10-CM

## 2015-07-09 DIAGNOSIS — T68XXXD Hypothermia, subsequent encounter: Secondary | ICD-10-CM

## 2015-07-09 DIAGNOSIS — I4729 Other ventricular tachycardia: Secondary | ICD-10-CM

## 2015-07-09 HISTORY — PX: CARDIAC CATHETERIZATION: SHX172

## 2015-07-09 LAB — PROCALCITONIN: Procalcitonin: 1.19 ng/mL

## 2015-07-09 LAB — COMPREHENSIVE METABOLIC PANEL
ALBUMIN: 3.3 g/dL — AB (ref 3.5–5.0)
ALK PHOS: 100 U/L (ref 38–126)
ALT: 666 U/L — AB (ref 14–54)
ANION GAP: 21 — AB (ref 5–15)
AST: 275 U/L — ABNORMAL HIGH (ref 15–41)
BILIRUBIN TOTAL: 0.8 mg/dL (ref 0.3–1.2)
BUN: 177 mg/dL — ABNORMAL HIGH (ref 6–20)
CALCIUM: 7.9 mg/dL — AB (ref 8.9–10.3)
CO2: 20 mmol/L — AB (ref 22–32)
CREATININE: 4.61 mg/dL — AB (ref 0.44–1.00)
Chloride: 91 mmol/L — ABNORMAL LOW (ref 101–111)
GFR calc Af Amer: 10 mL/min — ABNORMAL LOW (ref >60)
GFR calc non Af Amer: 8 mL/min — ABNORMAL LOW (ref >60)
GLUCOSE: 189 mg/dL — AB (ref 65–99)
Potassium: 3.6 mmol/L (ref 3.5–5.1)
Sodium: 132 mmol/L — ABNORMAL LOW (ref 135–145)
TOTAL PROTEIN: 5.8 g/dL — AB (ref 6.5–8.1)

## 2015-07-09 LAB — CBC
HCT: 32 % — ABNORMAL LOW (ref 36.0–46.0)
Hemoglobin: 10.7 g/dL — ABNORMAL LOW (ref 12.0–15.0)
MCH: 30.9 pg (ref 26.0–34.0)
MCHC: 33.4 g/dL (ref 30.0–36.0)
MCV: 92.5 fL (ref 78.0–100.0)
PLATELETS: 198 10*3/uL (ref 150–400)
RBC: 3.46 MIL/uL — ABNORMAL LOW (ref 3.87–5.11)
RDW: 13.1 % (ref 11.5–15.5)
WBC: 8.8 10*3/uL (ref 4.0–10.5)

## 2015-07-09 LAB — PROTIME-INR
INR: 1.34 (ref 0.00–1.49)
Prothrombin Time: 16.7 seconds — ABNORMAL HIGH (ref 11.6–15.2)

## 2015-07-09 LAB — HEPARIN LEVEL (UNFRACTIONATED): HEPARIN UNFRACTIONATED: 0.39 [IU]/mL (ref 0.30–0.70)

## 2015-07-09 LAB — PHOSPHORUS: Phosphorus: 8.7 mg/dL — ABNORMAL HIGH (ref 2.5–4.6)

## 2015-07-09 LAB — MAGNESIUM: Magnesium: 2.6 mg/dL — ABNORMAL HIGH (ref 1.7–2.4)

## 2015-07-09 SURGERY — RIGHT HEART CATH
Anesthesia: LOCAL

## 2015-07-09 MED ORDER — LORAZEPAM 2 MG/ML IJ SOLN: 2.0000 mg | INTRAMUSCULAR | Status: DC | PRN

## 2015-07-09 MED ORDER — SODIUM CHLORIDE 0.9% FLUSH
3.0000 mL | Freq: Two times a day (BID) | INTRAVENOUS | Status: DC
  Administered 2015-07-09 – 2015-07-10 (×3): 3 mL via INTRAVENOUS

## 2015-07-09 MED ORDER — LIDOCAINE HCL (PF) 1 % IJ SOLN
INTRAMUSCULAR | Status: AC
  Filled 2015-07-09: qty 30

## 2015-07-09 MED ORDER — LIDOCAINE HCL (PF) 1 % IJ SOLN
INTRAMUSCULAR | Status: DC | PRN
  Administered 2015-07-09: 5 mL via INTRADERMAL

## 2015-07-09 MED ORDER — SODIUM CHLORIDE 0.9 % IV SOLN: 250.0000 mL | INTRAVENOUS | Status: DC | PRN

## 2015-07-09 MED ORDER — ONDANSETRON HCL 4 MG/2ML IJ SOLN: 4.0000 mg | Freq: Four times a day (QID) | INTRAMUSCULAR | Status: DC | PRN

## 2015-07-09 MED ORDER — HEPARIN (PORCINE) IN NACL 2-0.9 UNIT/ML-% IJ SOLN
INTRAMUSCULAR | Status: AC
  Filled 2015-07-09: qty 500

## 2015-07-09 MED ORDER — DIPHENHYDRAMINE HCL 25 MG PO CAPS
ORAL_CAPSULE | ORAL | Status: AC
  Filled 2015-07-09: qty 1

## 2015-07-09 MED ORDER — DIPHENHYDRAMINE HCL 25 MG PO CAPS
25.0000 mg | ORAL_CAPSULE | Freq: Once | ORAL | Status: AC
  Administered 2015-07-09: 25 mg via ORAL

## 2015-07-09 MED ORDER — SODIUM CHLORIDE 0.9% FLUSH: 3.0000 mL | INTRAVENOUS | Status: DC | PRN

## 2015-07-09 MED ORDER — NEPRO/CARBSTEADY PO LIQD
237.0000 mL | ORAL | Status: DC
  Administered 2015-07-09 – 2015-07-12 (×2): 237 mL via ORAL
  Filled 2015-07-09 (×4): qty 237

## 2015-07-09 MED ORDER — HEPARIN (PORCINE) IN NACL 100-0.45 UNIT/ML-% IJ SOLN
900.0000 [IU]/h | INTRAMUSCULAR | Status: DC
  Administered 2015-07-09 – 2015-07-11 (×2): 800 [IU]/h via INTRAVENOUS
  Administered 2015-07-12: 850 [IU]/h via INTRAVENOUS
  Administered 2015-07-13: 900 [IU]/h via INTRAVENOUS
  Filled 2015-07-09 (×4): qty 250

## 2015-07-09 MED ORDER — FENTANYL CITRATE (PF) 100 MCG/2ML IJ SOLN
INTRAMUSCULAR | Status: DC | PRN
  Administered 2015-07-09: 25 ug via INTRAVENOUS

## 2015-07-09 MED ORDER — MIDAZOLAM HCL 2 MG/2ML IJ SOLN
INTRAMUSCULAR | Status: AC
  Filled 2015-07-09: qty 2

## 2015-07-09 MED ORDER — MIDAZOLAM HCL 2 MG/2ML IJ SOLN
INTRAMUSCULAR | Status: DC | PRN
  Administered 2015-07-09: 2 mg via INTRAVENOUS

## 2015-07-09 MED ORDER — ACETAMINOPHEN 325 MG PO TABS: 650.0000 mg | ORAL_TABLET | ORAL | Status: DC | PRN

## 2015-07-09 MED ORDER — HEPARIN SODIUM (PORCINE) 1000 UNIT/ML IJ SOLN
INTRAMUSCULAR | Status: AC
  Filled 2015-07-09: qty 1

## 2015-07-09 MED ORDER — SODIUM CHLORIDE 0.9% FLUSH
3.0000 mL | INTRAVENOUS | Status: DC | PRN
Start: 2015-07-09 — End: 2015-07-10

## 2015-07-09 MED ORDER — HEPARIN SODIUM (PORCINE) 1000 UNIT/ML IJ SOLN
INTRAMUSCULAR | Status: DC | PRN
Start: 2015-07-09 — End: 2015-07-09
  Administered 2015-07-09: 2400 [IU] via INTRAVENOUS

## 2015-07-09 MED ORDER — SODIUM CHLORIDE 0.9% FLUSH
3.0000 mL | Freq: Two times a day (BID) | INTRAVENOUS | Status: DC
  Administered 2015-07-09 (×2): 3 mL via INTRAVENOUS
  Administered 2015-07-10: 10 mL via INTRAVENOUS

## 2015-07-09 MED ORDER — FENTANYL CITRATE (PF) 100 MCG/2ML IJ SOLN
INTRAMUSCULAR | Status: AC
  Filled 2015-07-09: qty 2

## 2015-07-09 SURGICAL SUPPLY — 5 items
CATH SWAN GANZ 7F STRAIGHT (CATHETERS) ×1 IMPLANT
HOVERMATT SINGLE USE (MISCELLANEOUS) ×1 IMPLANT
PACK CARDIAC CATHETERIZATION (CUSTOM PROCEDURE TRAY) ×2 IMPLANT
SHEATH PINNACLE 7F 10CM (SHEATH) ×1 IMPLANT
TRANSDUCER W/STOPCOCK (MISCELLANEOUS) ×3 IMPLANT

## 2015-07-09 NOTE — Progress Notes (Signed)
PT Cancellation Note  Patient Details Name: [name] MRN: [id number] DOB: [birth year]   Cancelled Treatment:     Patient for cath lab procedure, will hold PT session today.   Fabio Asa 07/09/2015, 9:45 AM Charlotte Crumb, PT DPT  [phone]

## 2015-07-09 NOTE — Progress Notes (Signed)
Nutrition Follow-up  DOCUMENTATION CODES:   Obesity unspecified  INTERVENTION:   -Nepro Shake po daily, each supplement provides 425 kcal and 19 grams protein -Continue AM snack daily  NUTRITION DIAGNOSIS:   Increased nutrient needs related to other (see comment) (ETOH) as evidenced by estimated needs.  Ongoing  GOAL:   Patient will meet greater than or equal to 90% of their needs  Progressing  MONITOR:   PO intake, Labs, Weight trends, I & O's  REASON FOR ASSESSMENT:   Malnutrition Screening Tool    ASSESSMENT:   80 y.o. female with a past medical history of coronary artery disease, peripheral vascular disease, chronic kidney disease stage III, who was in her usual state of health until sometime in December when she started developing a cough which was dry. She had difficulty breathing. She was seen by her provider who prescribed an antibiotic regimen.   Pt transferred from [facility] on 07/08/15 due to heart cath and HD needs.   Pt underwent rt heart cath and trialysis catheter placement this AM. Pt had just returned from cath lab at time of visit and was fairly drowsy. No family present at time of visit.   Case discussed with RN. Plan to initiate HD later today. Per nephrology notes, pt will receive HD treatments today and tomorrow, however, pessimistic about recovery of renal function. Nutritional needs re-estimated and increased related to HD needs. Meal completion 30-50%.   Labs reviewed: Na: 132, Phos: 8.7, Mg: 2.6, GFR: 10, BUN/Creat: 177/4.61.   Diet Order:  Diet renal with fluid restriction Fluid restriction:: 1800 mL Fluid; Room service appropriate?: Yes; Fluid consistency:: Thin  Skin:  Reviewed, no issues  Last BM:  07/08/15  Height:   Ht Readings from Last 1 Encounters:  07/08/15 5\' 3"  (1.6 m)    Weight:   Wt Readings from Last 1 Encounters:  07/09/15 171 lb 1.2 oz (77.6 kg)    Ideal Body Weight:  52.3 kg  BMI:  Body mass index is 30.31  kg/(m^2).  Estimated Nutritional Needs:   Kcal:  1750-1950  Protein:  >70 grams  Fluid:  per MD  EDUCATION NEEDS:   Education needs addressed  Montia Haslip A. Mayford Knife, RD, LDN, CDE Pager: [phone] After hours Pager: [phone]

## 2015-07-09 NOTE — Interval H&P Note (Signed)
History and Physical Interval Note:  07/09/2015 9:36 AM  [name]  has presented today for surgery, with the diagnosis of HF and renal failure   The various methods of treatment have been discussed with the patient and family. After consideration of risks, benefits and other options for treatment, the patient has consented to  Procedure(s): Right Heart Cath (N/A) and placement of a trialysis catheter as a surgical intervention .  The patient's history has been reviewed, patient examined, no change in status, stable for surgery.  I have reviewed the patient's chart and labs.  Questions were answered to the patient's satisfaction.     Huzaifa Viney, Reuel Boom

## 2015-07-09 NOTE — Progress Notes (Signed)
Patient ID: [name], female   DOB: 1935-12-12, 80 y.o.   MRN: [id number]    Pt tentatively scheduled for temporary dialysis catheter to be placed in IR today.  Note from cardiology reports for Heart cath today and placement of Trialysis catheter at same time per Dr Herbie Baltimore  IR will HOLD on dialysis catheter for now Let us know if need [place]

## 2015-07-09 NOTE — Progress Notes (Signed)
Patient: Tanna Savoy / Admit Date: 07/03/2015 / Date of Encounter: 07/09/2015, 7:58 AM   Subjective: Breathing steady, does not feel SOB. No CP.    Objective: Telemetry: atrial fib rates controlled Physical Exam: Blood pressure 144/94, pulse 88, temperature 97.5 F (36.4 C), temperature source Oral, resp. rate 14, height 5\' 3"  (1.6 m), weight 171 lb 1.2 oz (77.6 kg), SpO2 100 %. General: Well developed, well nourished WF in no acute distress. Head: Normocephalic, atraumatic, sclera non-icteric, no xanthomas, nares are without discharge. Neck: JVP not elevated. Lungs: Diminished throughout, otherwise no rhonchi, wheezes or rales. Breathing is unlabored. Heart: Irregularly irregular, rate controlled, S1 S2 without murmurs, rubs, or gallops.  Abdomen: Soft, non-tender, non-distended with normoactive bowel sounds. No rebound/guarding. Extremities: No clubbing or cyanosis. No edema. Distal pedal pulses are 2+ and equal bilaterally. Neuro: Alert and oriented X 3. Moves all extremities spontaneously. Psych: Responds to questions appropriately with a normal affect.   Intake/Output Summary (Last 24 hours) at 07/09/15 0758 Last data filed at 07/09/15 0700  Gross per 24 hour  Intake 1523.27 ml  Output   1485 ml  Net  38.27 ml    Inpatient Medications:  . antiseptic oral rinse  7 mL Mouth Rinse BID  . aspirin EC  81 mg Oral Daily  . diphenhydrAMINE  50 mg Oral Once  . doxycycline (VIBRAMYCIN) IV  100 mg Intravenous Q12H  . folic acid  1 mg Oral Daily  . furosemide  80 mg Intravenous Q12H  . levalbuterol  0.63 mg Nebulization BID  . metoprolol  75 mg Oral BID  . multivitamin with minerals  1 tablet Oral Daily  . pantoprazole  40 mg Oral Daily  . predniSONE  60 mg Oral QAC breakfast  . sodium chloride flush  3 mL Intravenous Q12H  . sodium chloride flush  3 mL Intravenous Q12H  . thiamine  100 mg Oral Daily   Or  . thiamine  100 mg Intravenous Daily   Infusions:  .  sodium chloride 10 mL/hr at 07/09/15 0608  . heparin 800 Units/hr (07/09/15 0658)    Labs:  Recent Labs  07/08/15 0256 07/09/15 0420  NA 128* 132*  K 4.2 3.6  CL 90* 91*  CO2 21* 20*  GLUCOSE 145* 189*  BUN 171* 177*  CREATININE 4.68* 4.61*  CALCIUM 7.8* 7.9*  MG  --  2.6*  PHOS 8.7* 8.7*    Recent Labs  07/08/15 0256 07/09/15 0420  AST 502* 275*  ALT 778* 666*  ALKPHOS 113 100  BILITOT 0.8 0.8  PROT 6.1* 5.8*  ALBUMIN 3.6 3.3*    Recent Labs  07/08/15 0256 07/09/15 0420  WBC 8.8 8.8  HGB 10.9* 10.7*  HCT 32.3* 32.0*  MCV 94.7 92.5  PLT 199 198   No results for input(s): CKTOTAL, CKMB, TROPONINI in the last 72 hours. Invalid input(s): POCBNP No results for input(s): HGBA1C in the last 72 hours.   Radiology/Studies:  Ct Abdomen Pelvis Wo Contrast  07/07/2015  CLINICAL DATA:  Small bowel obstruction. Perforated viscus. Abnormal x-rays appear EXAM: CT ABDOMEN AND PELVIS WITHOUT CONTRAST TECHNIQUE: Multidetector CT imaging of the abdomen and pelvis was performed following the standard protocol without IV contrast. COMPARISON:  Plain films performed today. FINDINGS: Mild cardiomegaly. Small left pleural effusion and trace right effusion. Minimal dependent atelectasis in the lung bases. Liver, gallbladder, spleen, pancreas, adrenals have an unremarkable unenhanced appearance. Vascular calcifications in the renal hila bilaterally. No ureteral stones or hydronephrosis. Urinary  bladder is unremarkable. Scattered colonic diverticula.  No active diverticulitis. The previously seen small bowel prominence is less evident by CT. There is mild prominence of upper abdominal small bowel loops with decompressed distal small bowel loops. Contrast material has made it into the right side of the colon. I do not see any transition point. No CT evidence for obstruction. Aorta and iliac vessels are heavily calcified, non aneurysmal. Prior hysterectomy. No adnexal masses. No free fluid, free  air or adenopathy. No acute bony abnormality or focal bone lesion. IMPRESSION: No CT evidence for small bowel obstruction. No evidence of free air as questioned on plain films. Scattered colonic diverticulosis.  No active diverticulitis. Small left pleural effusion.  Trace right pleural effusion. Electronically Signed   By: Charlett Nose M.D.   On: 07/07/2015 16:01   Dg Chest 2 View  07/05/2015  CLINICAL DATA:  80 year old female with shortness of breath. EXAM: CHEST  2 VIEW COMPARISON:  07/03/2015 and prior exams FINDINGS: Cardiomegaly, CABG changes and mild pulmonary vascular congestion again noted. There is no evidence of focal airspace disease, pulmonary edema, suspicious pulmonary nodule/mass, pleural effusion, or pneumothorax. No acute bony abnormalities are identified. IMPRESSION: Cardiomegaly with mild pulmonary vascular congestion. Electronically Signed   By: Harmon Pier M.D.   On: 07/05/2015 09:26   Dg Chest 2 View  07/03/2015  CLINICAL DATA:  Cough, congestion, shortness of breath, and weakness for 3 weeks. EXAM: CHEST  2 VIEW COMPARISON:  05/30/2006 FINDINGS: Mild enlargement of the cardiopericardial silhouette, with mild cephalization of blood flow but no overt edema. No Kerley B-lines observed. Prior CABG.  Atherosclerotic calcification of the aortic arch. Thoracic spondylosis.  No pleural effusion. IMPRESSION: 1. Stable mild enlargement of the cardiopericardial silhouette, with pulmonary venous hypertension but without overt edema. 2. Prior CABG. 3. Atherosclerotic aortic arch. Electronically Signed   By: Gaylyn Rong M.D.   On: 07/03/2015 12:06   Ct Chest Wo Contrast  07/08/2015  CLINICAL DATA:  Shortness of breath. EXAM: CT CHEST WITHOUT CONTRAST TECHNIQUE: Multidetector CT imaging of the chest was performed following the standard protocol without IV contrast. COMPARISON:  Radiography an abdominal CT 07/07/2015 FINDINGS: There is a small amount of pleural fluid layering dependently,  more on the left than the right. There is no pericardial fluid. There is atherosclerosis of the aorta and its branch vessels including the coronary arteries. No enlarged hilar or mediastinal lymph nodes are visible. Mild prominence of the septal lines in the lungs could be associated with mild edema/ fluid overload. No advanced pulmonary edema. Micronodular opacities within both lungs, probably tree in bud opacities, most likely to reflect bronchopneumonia with atypical organisms such as mycobacterium. The differential diagnosis does include an early manifestation of edema and micronodular metastatic disease, but those are felt less likely. Scans in the upper abdomen do not show any acute finding. IMPRESSION: Advanced atherosclerosis of the aorta and its branch vessels. Probable mild edema/fluid overload. Small effusions layering dependently, left larger than right. Bilateral tree in bud opacities usually indicative of bronchopneumonia with atypical organisms such as mycobacterium. Differential diagnosis does include an early manifestation of edema and micronodular metastatic disease, but those are felt less likely. Electronically Signed   By: Paulina Fusi M.D.   On: 07/08/2015 10:45   US Abdomen Complete  07/04/2015  CLINICAL DATA:  80 year old female with transaminitis/ elevated LFTs. Patient with chronic renal disease. EXAM: ABDOMEN ULTRASOUND COMPLETE COMPARISON:  None. FINDINGS: Gallbladder: The gallbladder is unremarkable. There is no evidence of  acute cholecystitis or cholelithiasis. Common bile duct: Diameter: 5 mm. There is no evidence of intrahepatic or extrahepatic biliary dilatation. Liver: Slightly increased and heterogeneous hepatic echotexture identified without focal lesions. IVC: No abnormality visualized. Pancreas: Visualized portion unremarkable. Spleen: Size and appearance within normal limits. Right Kidney: Length: 11.1 cm. Cortical thinning and increased renal echogenicity noted. There is no  evidence of hydronephrosis or solid mass. Left Kidney: Length: 11.1 cm. Cortical thinning and increased renal echogenicity noted. There is no evidence of hydronephrosis or solid mass. Abdominal aorta: No aneurysm visualized. Other findings: None. IMPRESSION: Slightly increased and heterogeneous hepatic echotexture which is nonspecific but may represent hepatic steatosis. Cortical thinning and increased renal echogenicity compatible with medical renal disease. No other significant abnormalities. Electronically Signed   By: Harmon Pier M.D.   On: 07/04/2015 12:29   Dg Abd Acute W/chest  07/07/2015  CLINICAL DATA:  Hypothermia, history of CABG, former smoking history EXAM: DG ABDOMEN ACUTE W/ 1V CHEST COMPARISON:  Chest x-ray of 07/05/2015 and ultrasound of the abdomen of 07/04/2015 FINDINGS: No active infiltrate or effusion is seen. Cardiomegaly is stable. Median sternotomy sutures are noted from prior CABG. Supine and left lateral decubitus films of the abdomen were obtained. Better seen on the supine film there are somewhat distended loops of small bowel, and the colon and distal small bowel appears decompressed. A partial small bowel obstruction is a definite consideration. On the left lower decubitus films, there is some lucency just above the right lobe of liver, and a small amount of free intraperitoneal air cannot be excluded. CT the abdomen pelvis is recommended to assess for possible free air as well as evaluate the possibility of partial small bowel obstruction. No opaque calculi are seen. The bones are osteopenic. IMPRESSION: 1. Prominent loops of small bowel may indicate a partial small bowel obstruction. 2. Cannot exclude a small amount of free air on the decubitus film. In view of these findings, CT of the abdomen pelvis at this time is recommended. 3. No active lung disease.  Cardiomegaly. I spoke with Dr. Ramiro Harvest concerning the findings of this study at 12:51 p.m. Electronically Signed   By:  Dwyane Dee M.D.   On: 07/07/2015 12:52     Assessment and Plan  80 y/o F with history of CAD s/p CABG 2007 (LIMA-LAD, SVG-dRCA, SVG-intermediate, SVG-diagonal, neg nuc 2014), EtOH abuse, PVD s/p right femoropopliteal bypass grafting, renal artery stenosis s/p L renal stent 11/2013 (residual RAS being followed for now), reported mild-mod carotid artery disease, CKD stage III, chronic-appearing anemia, former tobacco abuse admitted with fever, cough, acute kidney injury on CKD (BUN 93/Cr 3.64 - previously 2 range), newly recognized atrial fib (rates 90-110 on admission), marked transaminitis, lactic acidosis, elevated BNP and troponin 1.09. Patient also initially reported 30 lb weight loss but actual weights in computer (beside stated weight) congruent with prior. 2D Echo 07/05/15: EF 20-25%, diffuse HK, akinesis of anteroseptal and inferolateral myocardium, mild AI, mod MR, severely dilated LA, mildly dilated RA, mod reduced RV function. (Prior EF by nuc in 2014 was 63%). Placed on high dose diuretics in attempts to diurese per renal with subsequent worsening of BUN/Cr. On 07/07/15 she developed hypothermia of unclear cause with possible pneumoperitoneum later ruled out by CT.  1. Cough/fever/SOB/wheezing felt possibly due to combination of PNA and acute combined systolic and diastolic CHF, also with hypothermia on 07/07/15 and persistently elevated procalcitonin. Presentation initially suspected pulmonary & improved with steroids/abx, but renal was also concerned for  component of acute systolic CHF in the setting of acute renal failure with new low EF. Diuretics have been managed by renal -> initially treated with high dose, then held due to BUN 150+/4.6, then resumed. Plan RHC today to assess volume status and help guide further management. See Dr. Elissa Hefty note yesterday - plan is tentatively to try and place trialysis catheter during RHC as well. Etiology of LV dysfunction not clear - top ddx includes  ischemia, afib, alcohol abuse, or viral. Also appreciate IM input regarding persistent elevation of procalcitonin. CT Chest 07/08/15 - bilateral tree bud opacities ?bronchoPNA with atypical organisms, less likely edema or micronodular metastatic disease.  2. AKI on CKD stage III - diuretics being managed by nephrology. BUN/Cr has plateaued today. Will ask nurse to hold AM dose of Lasix until we get more info from RHC. See above re: trialysis catheter.  3. Elevated troponin - peak trop 1.09. ? NSTEMI vs demand ischemia. Spoke with Dr. Herbie Baltimore this AM - plan is to hold off left heart cath until we have a more definitive plan for kidney recovery. Continue ASA. Dr. Jens Som d/c'd Plavix earlier this admission. She denies any recent angina.   4. Newly recognized atrial fibrillation - TSH normal. Remains on heparin. Initially there was plan to follow for outpatient DCCV but this may change based on RHC data.   5. EtOH abuse - counseled regarding cessation.   6. Acute transaminitis - felt possibly related to alcohol abuse. Abd [place] increased heterogeneous hepatic echotexture nonspecific but may represent hepatic steatosis. Acute hep panel neg. Per IM, they discussed case with Dr. Loreta Ave who recommended discontinuing Praluent with outpatient f/u. ? Hepatic congestion. Numbers seem to be finally improving.  7. NSVT - 16 beats on 07/07/15. Continue BB.   Signed, Ronie Spies PA-C Pager: [phone]   Patient seen and examined with Ronie Spies, PA-C. We discussed all aspects of the encounter. I agree with the assessment and plan as stated above.   RHC results reviewed with her and her family. Moderately elevated filling pressures with moderately depressed cardiac output. Suspect component of cardiorenal syndrome. Trialysis catheter placed and will proceed with iHD (discussed with Dr. Eliott Nine). Doubt ACS here but agree with need for possible coronary angio once renal trajectory determined. Would eventually consider TEE  and DC-CV.   Felisha Claytor,MD 5:20 PM

## 2015-07-09 NOTE — Progress Notes (Signed)
[place] TEAM 1 - Stepdown/ICU TEAM Progress Note  [name] NGE:[id number] DOB: [birth year] DOA: 07/03/2015 PCP: Michiel Sites, MD  Admit HPI / Brief Narrative: [name] is a 80 y.o. WF PMHx ETOH Abuse (5-6 glasses Liquor/dy)  history of coronary artery disease, peripheral vascular disease, chronic kidney disease stage III, who was in her usual state of health until sometime in December when she started developing a cough which was dry. She had difficulty breathing. She was seen by her provider who prescribed an antibiotic regimen. Chest x-ray did not show any pneumonia. She took these medications for 10 days. Did not feel any better. And then about 4 days ago she went back to see her primary care physician. This time she was experiencing greenish expectoration with cough. She had a fever up to 102F. She was wheezing. She was given a steroid injection. She was prescribed Ceftin.  In the emergency department evaluation revealed elevated BNP and troponin level. New-onset atrial fibrillation with rapid ventricular response. Lactic acid was also elevated. WBC was normal. She was noted to have acute on chronic renal failure versus progressive renal disease. Patient also noted to have a transaminitis. Acute hepatitis panel was negative. Abdominal ultrasound with hepatic steatosis. LFTs started to trend down since admission however over the past 24-48 hours no significant change. Patient was on a lipid lowering medication which is currently on hold. Case discussed with Dr. Loreta Ave who recommended outpatient follow-up. Repeat chest x-ray showed Claritin as such IV antibiotics were discontinued. On the morning of 07/07/2015 patient noted to be hypothermic and subsequently transferred to the stepdown unit for bear hugger and labs obtained.   HPI/Subjective: 2/16 A/O 4, states drinks 5-6 glasses of liquor per day. States does not smoke   Assessment/Plan: Hypothermia -Resolved    -TSH, WNL  cough chronic /shortness of breath/CAP? -Resolved  -Influenza PCR is negative. -Patient afebrile, negative leukocytosis very much doubt CAP. DC antibiotics and monitor  Acute on Chronic CKD IV/Now CKD V/Cardiorenal syndrome -Patient will start HD today  -HD per nephrology  TransaminitisAlcohol Abuse -Drinks 5-6 glasses of liquor per day. Last drink per patient approximately admission. -CIWA Protocol  -Most likely secondary to patient's alcoholism. Although could be secondary to patient's home medication of Alirocumab. -Hold Alirocumab -Trending down continue to monitor  -Obtain acute hepatitis panel  -Admitting team Discussed case with Dr. Loreta Ave, gastroenterology who recommended discontinuing lipid medication with outpatient follow-up in 2 weeks for further evaluation.  New onset Atrial fibrillation with RVR/cardiomyopathy/Chronic Systolic CHF  -Rate controlled -Metoprolol 75 mg BID -See echocardiogram below - Continue IV heparin for anticoagulation; continue per cardiology -With new renal failure ACEI/ARB would be a poor choice  Elevated troponin /NSTEMI -Patient asymptomatic.  -See atrial fibrillation  CAD native artery  -Discussed at length a sequela of continuing to drink alcohol to include death -See atrial fibrillation    Code Status: FULL Family Communication: no family present at time of exam Disposition Plan: Per nephrology/cardiology    Consultants: Nephrology: Dr.Schertz 07/03/2015 Cardiology: Dr. Allyson Sabal 07/03/2015   Procedure/Significant Events: 2/11 Abdominal ultrasound; increased and heterogeneous hepatic echotexture;nonspecific but may represent hepatic steatosis.  2/12 echocardiogram;- Left ventricle: mild focalbasal hypertrophy of the septum.LVEF= 20%-25%. Diffuse hypokinesis. Akinesis anteroseptal and inferolateral myocardium. - Aortic valve: There was mild regurgitation. - Mitral valve: moderateregurgitation.--- Left atrium:severely  dilated. 2/15 CT Chest wo contrast;Probable mild edema/fluid overload. -Small effusions layering dependently, left larger than right. -Bilateral tree in bud opacities usually indicative of bronchopneumonia with  atypical organisms such as mycobacterium. DDx include an early manifestation of edema and micronodular metastatic disease, but those are felt less likely.     Culture 2/10 blood right/Left AC negative final 2/14 blood right arm/hand NGTD 2/15 MRSA by PCR negative   Antibiotics: IV cefepime 07/04/2015>>>>> 07/05/2015  IV vancomycin 07/03/2015>>>>> 07/05/2015 2/15 doxycycline>> 2/16   DVT prophylaxis: Heparin drip  Devices NA   LINES / TUBES:  [facility] HD Cath 2/16    Continuous Infusions: . sodium chloride 10 mL/hr at 07/09/15 0608  . heparin 800 Units/hr (07/09/15 6213)    Objective: VITAL SIGNS: Temp: 97.5 F (36.4 C) (02/16 0751) Temp Source: Oral (02/16 0751) BP: 141/89 mmHg (02/16 0751) Pulse Rate: 67 (02/16 0751) SPO2; FIO2:   Intake/Output Summary (Last 24 hours) at 07/09/15 0802 Last data filed at 07/09/15 0700  Gross per 24 hour  Intake 1523.27 ml  Output   1485 ml  Net  38.27 ml     Exam: General: A/O x 4 , Positive acute respiratory distress Eyes: Negative headache, negative scleral hemorrhage ENT: Negative Runny nose, negative gingival bleeding, Neck:  Negative scars, masses, torticollis, lymphadenopathy, JVD Lungs: Diffuse decreased Breath sounds, Clear to auscultation bilaterally without wheezes or crackles Cardiovascular: Tachycardia, Regular rhythm without murmur gallop or rub normal S1 and S2 Abdomen:negative abdominal pain, nondistended, positive soft, bowel sounds, no rebound, no ascites, no appreciable mass Extremities: No significant cyanosis, clubbing, or edema bilateral lower extremities Psychiatric:  Negative depression, positive anxiety, negative fatigue, negative mania Neurologic:  Cranial nerves II through XII intact,  tongue/uvula midline, all extremities muscle strength 5/5, sensation intact throughout, negative dysarthria, negative expressive aphasia, negative receptive aphasia.   Data Reviewed: Basic Metabolic Panel:  Recent Labs Lab 07/05/15 0551 07/06/15 0615 07/07/15 0547 07/08/15 0256 07/09/15 0420  NA 130* 128* 130* 128* 132*  K 3.8 3.9 4.2 4.2 3.6  CL 92* 91* 91* 90* 91*  CO2 21* 20* 21* 21* 20*  GLUCOSE 143* 130* 141* 145* 189*  BUN 132* 158* 158* 171* 177*  CREATININE 3.90* 4.36* 4.65* 4.68* 4.61*  CALCIUM 7.7* 7.5* 7.9* 7.8* 7.9*  MG  --  2.2  --   --  2.6*  PHOS  --   --  8.5* 8.7* 8.7*   Liver Function Tests:  Recent Labs Lab 07/05/15 0551 07/06/15 0615 07/07/15 0547 07/08/15 0256 07/09/15 0420  AST 802* 695* 612* 502* 275*  ALT 695* 722* 815* 778* 666*  ALKPHOS 119 119 126 113 100  BILITOT 0.9 1.1 0.8 0.8 0.8  PROT 5.9* 5.6* 6.3* 6.1* 5.8*  ALBUMIN 3.3* 3.2* 3.7 3.6 3.3*   No results for input(s): LIPASE, AMYLASE in the last 168 hours. No results for input(s): AMMONIA in the last 168 hours. CBC:  Recent Labs Lab 07/03/15 1236  07/05/15 0551 07/06/15 0615 07/07/15 0547 07/08/15 0256 07/09/15 0420  WBC 7.7  < > 7.9 8.2 8.7 8.8 8.8  NEUTROABS 5.8  --   --   --   --   --   --   HGB 10.3*  < > 10.9* 10.4* 11.0* 10.9* 10.7*  HCT 30.4*  < > 32.9* 30.7* 33.1* 32.3* 32.0*  MCV 96.2  < > 96.5 95.6 95.4 94.7 92.5  PLT 186  < > 202 177 202 199 198  < > = values in this interval not displayed. Cardiac Enzymes:  Recent Labs Lab 07/03/15 1236 07/03/15 1603 07/03/15 2247 07/04/15 0425 07/04/15 0865  CKTOTAL  --   --   --   --  318*  TROPONINI 1.09* 0.85* 0.54* 0.45*  --    BNP (last 3 results)  Recent Labs  07/03/15 1236  BNP 4223.3*    ProBNP (last 3 results) No results for input(s): PROBNP in the last 8760 hours.  CBG: No results for input(s): GLUCAP in the last 168 hours.  Recent Results (from the past 240 hour(s))  Culture, blood (routine x  2)     Status: None   Collection Time: 07/03/15 12:32 PM  Result Value Ref Range Status   Specimen Description BLOOD RIGHT ANTECUBITAL  Final   Special Requests BOTTLES DRAWN AEROBIC AND ANAEROBIC 5CC  Final   Culture   Final    NO GROWTH 5 DAYS Performed at [facility]    Report Status 07/08/2015 FINAL  Final  Culture, blood (routine x 2)     Status: None   Collection Time: 07/03/15 12:35 PM  Result Value Ref Range Status   Specimen Description BLOOD LEFT ANTECUBITAL  Final   Special Requests BOTTLES DRAWN AEROBIC AND ANAEROBIC 5CC  Final   Culture   Final    NO GROWTH 5 DAYS Performed at [facility]    Report Status 07/08/2015 FINAL  Final  Culture, blood (Routine X 2) w Reflex to ID Panel     Status: None (Preliminary result)   Collection Time: 07/07/15  8:38 AM  Result Value Ref Range Status   Specimen Description BLOOD RIGHT ARM  Final   Special Requests BOTTLES DRAWN AEROBIC AND ANAEROBIC 5CC  Final   Culture   Final    NO GROWTH 1 DAY Performed at [facility]    Report Status PENDING  Incomplete  Culture, blood (Routine X 2) w Reflex to ID Panel     Status: None (Preliminary result)   Collection Time: 07/07/15  8:47 AM  Result Value Ref Range Status   Specimen Description BLOOD RIGHT HAND  Final   Special Requests IN PEDIATRIC BOTTLE 3CC  Final   Culture   Final    NO GROWTH 1 DAY Performed at [facility]    Report Status PENDING  Incomplete  MRSA PCR Screening     Status: None   Collection Time: 07/08/15  7:54 AM  Result Value Ref Range Status   MRSA by PCR NEGATIVE NEGATIVE Final    Comment:        The GeneXpert MRSA Assay (FDA approved for NASAL specimens only), is one component of a comprehensive MRSA colonization surveillance program. It is not intended to diagnose MRSA infection nor to guide or monitor treatment for MRSA infections.      Studies:  Recent x-ray studies have been reviewed in detail by the Attending  Physician  Scheduled Meds:  Scheduled Meds: . antiseptic oral rinse  7 mL Mouth Rinse BID  . aspirin EC  81 mg Oral Daily  . diphenhydrAMINE  50 mg Oral Once  . doxycycline (VIBRAMYCIN) IV  100 mg Intravenous Q12H  . folic acid  1 mg Oral Daily  . furosemide  80 mg Intravenous Q12H  . levalbuterol  0.63 mg Nebulization BID  . metoprolol  75 mg Oral BID  . multivitamin with minerals  1 tablet Oral Daily  . pantoprazole  40 mg Oral Daily  . predniSONE  60 mg Oral QAC breakfast  . sodium chloride flush  3 mL Intravenous Q12H  . sodium chloride flush  3 mL Intravenous Q12H  . thiamine  100 mg Oral Daily  Or  . thiamine  100 mg Intravenous Daily    Time spent on care of this patient: 40 mins   Neilani Duffee, Roselind Messier , MD  [facility] Office  [phone] Pager [phone]  On-Call/Text Page:      [email or web address]      password [email or web address]  If 7PM-7AM, please contact night-coverage [email or web address] Password [email or web address] 07/09/2015, 8:02 AM   LOS: 6 days   Care during the described time interval was provided by me .  I have reviewed this patient's available data, including medical history, events of note, physical examination, and all test results as part of my evaluation. I have personally reviewed and interpreted all radiology studies.   Carolyne Littles, MD [phone] Pager

## 2015-07-09 NOTE — Progress Notes (Signed)
[facility] Brief Note  See my earlier prog note R heart cath done showing moderately elevated filling pressures Trialysis catheter was placed  Pt will get her first hemodialysis treatment this afternoon, 2nd treatment tomorrow (I was not planning CVVHD)  I am pessimistic about recovery of renal function but we will see.  Camille Bal, MD [facility] [phone] Pager 07/09/2015, 2:31 PM

## 2015-07-09 NOTE — H&P (View-Only) (Signed)
Subjective: Breathing better.  Now CP.  Dizzy when she stands up.  Objective: Vital signs in last 24 hours: Temp:  [96.7 F (35.9 C)-97.8 F (36.6 C)] 96.7 F (35.9 C) (02/15 1636) Pulse Rate:  [51-141] 103 (02/15 1645) Resp:  [12-20] 14 (02/15 1645) BP: (134-174)/(84-117) 141/105 mmHg (02/15 1645) SpO2:  [94 %-100 %] 98 % (02/15 1645) Weight:  [167 lb 8.8 oz (76 kg)] 167 lb 8.8 oz (76 kg) (02/15 1600) Last BM Date: 07/08/15  Intake/Output from previous day: 02/14 0701 - 02/15 0700 In: 456.4 [P.O.:250; I.V.:206.4] Out: 650 [Urine:650] Intake/Output this shift:    Medications Scheduled Meds: . antiseptic oral rinse  7 mL Mouth Rinse BID  . aspirin EC  81 mg Oral Daily  . diphenhydrAMINE  50 mg Oral Once  . doxycycline (VIBRAMYCIN) IV  100 mg Intravenous Q12H  . folic acid  1 mg Oral Daily  . furosemide  80 mg Intravenous Q12H  . levalbuterol  0.63 mg Nebulization BID  . metoprolol  75 mg Oral BID  . multivitamin with minerals  1 tablet Oral Daily  . pantoprazole  40 mg Oral Daily  . predniSONE  60 mg Oral QAC breakfast  . sodium chloride flush  3 mL Intravenous Q12H  . thiamine  100 mg Oral Daily   Or  . thiamine  100 mg Intravenous Daily   Continuous Infusions: . heparin 800 Units/hr (07/08/15 1228)   PRN Meds:.sodium chloride, acetaminophen **OR** acetaminophen, levalbuterol, LORazepam, ondansetron **OR** ondansetron (ZOFRAN) IV  PE: General appearance: alert, cooperative and no distress Neck: Appears elevated Lungs: clear to auscultation bilaterally Heart: irregularly irregular rhythm Abdomen: +BS, Nontender Extremities: No LEE Pulses: 2+ and symmetric Skin: Warm and dry Neurologic: Grossly normal, generalized weakness.    Lab Results:   Recent Labs  07/06/15 0615 07/07/15 0547 07/08/15 0256  WBC 8.2 8.7 8.8  HGB 10.4* 11.0* 10.9*  HCT 30.7* 33.1* 32.3*  PLT 177 202 199   BMET  Recent Labs  07/06/15 0615 07/07/15 0547 07/08/15 0256    NA 128* 130* 128*  K 3.9 4.2 4.2  CL 91* 91* 90*  CO2 20* 21* 21*  GLUCOSE 130* 141* 145*  BUN 158* 158* 171*  CREATININE 4.36* 4.65* 4.68*  CALCIUM 7.5* 7.9* 7.8*    Assessment/Plan  80 yo female with a history of CAD s/p CABG 2007 (LIMA-LAD, SVG-dRCA, SVG-intermediate, SVG-diagonal, neg nuc 2014), EtOH abuse, PVD s/p right femoropopliteal bypass grafting, renal artery stenosis s/p L renal stent 11/2013 (residual RAS being followed for now), reported mild-mod carotid artery disease, CKD stage III, chronic-appearing anemia, former tobacco abuse admitted with fever, cough, acute kidney injury on CKD (BUN 93/Cr 3.64 - previously 2 range), newly recognized atrial fib (rates 90-110 on admission), marked transaminitis, lactic acidosis, elevated BNP and initial troponin 1.09. Patient also initially reported 30 lb weight loss but actual weights in computer (beside stated weight) congruent with prior.  2D Echo 07/05/15: EF 20-25%, diffuse HK, akinesis of anteroseptal and inferolateral myocardium, mild AI, mod MR, severely dilated LA, mildly dilated RA, mod reduced RV function. (Prior EF by nuc in 2014 was 63%).  Principal Problem:   Acute respiratory failure with hypoxia and hypercarbia (HCC) Active Problems:   CAD in native artery - CABG x4 (LIMA-LAD, SVG-distal RCA, SVG-diagonal, SVG-Ramus   Chronic renal insufficiency, stage III (moderate)   Acute on chronic renal failure (HCC)   Elevated troponin   Persistent atrial fibrillation (HCC)   New onset atrial  fibrillation (HCC)   Congestive dilated cardiomyopathy (HCC)   Cardiorenal syndrome with renal failure   Hypertension, essential   Hyperlipidemia with target LDL less than 70   Renal artery stenosis, native, bilateral (HCC)   Transaminitis   Dyspnea   Unintentional weight loss   Lactic acidosis   Sepsis (HCC)   Community acquired pneumonia   Shortness of breath   SOB (shortness of breath)   Hypothermia  Cough/fever/SOB/wheezing  possibly due to combination of PNA.  Hypothermia on 07/07/15, unclear cause. Presentation initially suspected pulmonary & improved with steroids/abx, but renal was also concerned for component of acute systolic CHF in the setting of acute renal failure with new low EF. Diuretics being managed by renal.   IV given today.  Does not appear significantly volume overloaded on exam but JVD appears elevated.   Not a candidate for ACEI/ARB/spiro with AKI. Would consolidate Lopressor to Toprol closer to d/c. Etiology of LV dysfunction not clear - top ddx includes ischemia,  afib, alcohol abuse, or viral. See below.   Planning right and left heart caths.   Acute combined systolic and diastolic CHF  Net fluids: -0.2L.  CT Chest this morning:  "Advanced atherosclerosis of the aorta and its branch vessels. Probable mild edema/fluid overload. Small effusions layering dependently, left larger than right.  Bilateral tree in bud opacities usually indicative of bronchopneumonia with atypical organisms such as mycobacterium.  Differential diagnosis does include an early manifestation of edema and micronodular metastatic disease, but those are felt less likely."  AKI on CKD stage III - BUN/Cr high but stable 171/4.68. Nephrology following.  Elevated troponin - Peak trop 1.09.  The patient is agreeable to having a cardiac cath knowing that she will likely need dialysis.  With ? EF by echo 20-25%, There is akinesis of the anteroseptal and inferolateral myocardium. Continue ASA. She denies any recent angina.    Right heart cath in AM first (with sheath exchange for Tiralysis Catheter).  Once HD set up and she can lay flat, can potentially consider LHC/Angio    Newly recognized atrial fibrillation -  Rate controlled.  TSH normal. Continue rate control. On lopressor 50 twice daily.  IV heparin.  Not currently a candidate for DOAC with renal dysfunction. With EtOH abuse she may not be a good long-term anticoagulation  candidate.  Per Dr. Ludwig Clarks note, continue heparin per pharmacy and transition to Coumadin later pending renal function with plan for DCCV (? As in vs. Out patient) if atrial fibrillation persists. Afib could also be related to acute illness so as she recovers could treat with short term anticoagulation and then proceed with DCCV to see if she holds sinus and then DC anticoagulation 4 weeks later. Dr. Herbie Baltimore is concerned about potential use of warfarin as well, however, given elevated LFTs. Will need to follow.  Left Atrium is severely dilated which will likely make successful conversion less likely.   EtOH abuse - counseled regarding cessation. On CIWA.   Acute transaminitis -  felt possibly related to alcohol abuse, but levels persist. Abd [place] increased heterogeneous hepatic echotexture nonspecific but may represent hepatic steatosis. Acute hep panel neg. Per IM, they discussed case with Dr. Loreta Ave who recommended discontinuing Praluent with outpatient f/u.  NSVT 16 beat run last night. Increase lopressor to 75 twice daily    LOS: 5 days    HAGER, BRYAN PA-C 07/08/2015 7:09 PM  I saw evaluated the patient today after she was transferred to [facility]. I agree  with Bryan's findings, assessment and plan as noted above. We discussed the plan prior to her transfer.  Overall she seems clinically improved, and her creatinine seems to have now plateaued. She does continue to have urine output, so she is not anuric but likely oliguric.  LFTs are now starting to trend down. Sodium is slightly lower.  On exam today she does have a little JVD with hepatojugular reflux.  I asked Dr. Gala Romney from heart failure service to evaluate the patient with me this afternoon. The most difficult issue here is that we're trying to determine her hemodynamic/volume status.  She has new onset heart failure associated with acute hepatic and renal failure that due to an unexplained etiology. She has known  CAD with history of CABG. She has known renal artery stenosis on Doppler exams in the past.  She has newly diagnosed atrial fibrillation, that is rate controlled. I do agree with increasing the beta blocker as there is a short run of nonsustained VT on telemetry. This could've been apparent see, but just to be sure increasing beta blockers is warranted.  I discussed the case with Dr. Gala Romney and he felt that we need to determine the volume status.  Dr. Gala Romney recommended right heart catheterization in the cardiac catheterization lab tomorrow followed by switching out the Swan sheath for a Trialysis catheter (he is working to adjust his schedule in order to perform this in the morning) - if unable to do so, can continue with VIR for  dialysis catheter  Dr. Gala Romney will likely be the one performing the procedure, as he is comfortable with exchanging sheaths for the dialysis catheter. He will therefore know how to best interpret the results. Depending on the findings, we may actually ask the advanced heart failure service to take over primary management of her cardiology/heart failure issues.  We would like to proceed with a right heart catheterization first in order to assess her volume status as it is unclear as well as her hemodynamics and cardiac output. The concern here is that she may have low output failure with reduced EF that is exacerbated by A. fib. This could be leading to cardiorenal syndrome plus or minus congestive hepatopathy.   She does have a new decrease in EF, and would benefit from ischemic evaluation. Based on the positive troponins (not sure if this is a true non-STEMI versus demand ischemia - as age: Of one is unlikely to cause a significant drop in EF) -- I would like to consider left heart catheterization, however we need to ensure that her renal function is stable post-minus improving.   The intention would be to avoid potential of going from chronic renal insufficiency  to end-stage renal insufficiency requiring long-term dialysis which she does not want.   Once we are sure of the renal function plus or minus the ability to dialyze out contrast, we can consider coronary and graft angiography with potentially staged PCI    She continues to be on IV heparin which can be held for procedures. This is for combination elevation of cardiac enzymes as well as A. fib. Her A. fib rate is controlled with current dose of beta blocker   Prior to determining the appropriate anticoagulation strategy for long-term, it would be beneficial to know her coronary anatomy  Pending the right heart catheterization numbers, we may determine that she would benefit from TEE guided cardioversion. Potentially restoring sinus rhythm could improve cardiac output.     Marykay Lex, M.D., M.S.  Interventional Cardiologist   Pager # [phone] Phone # [phone] [address] [place], [place] [address]

## 2015-07-09 NOTE — Progress Notes (Signed)
ANTICOAGULATION CONSULT NOTE  Pharmacy Consult for heparin Indication: chest pain/ACS and atrial fibrillation  Allergies  Allergen Reactions  . Angiotensin Receptor Blockers   . Latex Swelling  . Statins Other (See Comments)    Diffuse cramping; has tried Lipitor, Crestor, Pravachol and simvastatin  . Benicar [Olmesartan] Itching and Rash    Patient Measurements: Height:  (160 cm) Weight: 171 lb 1.2 oz (77.6 kg) IBW/kg (Calculated) : 52.4 Heparin Dosing Weight: 63 kg  Vital Signs: Temp: 97.3 F (36.3 C) (02/16 1156) Temp Source: Oral (02/16 1156) BP: 138/97 mmHg (02/16 1200) Pulse Rate: 102 (02/16 1200)  Labs:  Recent Labs  07/07/15 0547  07/08/15 0256 07/08/15 1059 07/08/15 2015 07/09/15 0420  HGB 11.0*  --  10.9*  --   --  10.7*  HCT 33.1*  --  32.3*  --   --  32.0*  PLT 202  --  199  --   --  198  LABPROT  --   --   --   --   --  16.7*  INR  --   --   --   --   --  1.34  HEPARINUNFRC 1.05*  < > 0.32 0.27* 0.37 0.39  CREATININE 4.65*  --  4.68*  --   --  4.61*  < > = values in this interval not displayed.  Estimated Creatinine Clearance: 10.1 mL/min (by C-G formula based on Cr of 4.61).   Medical History: Past Medical History  Diagnosis Date  . CAD in native artery 2007    a. S/p CABG 2007 (LIMA-LAD, SVG-dRCA, SVG-intermediate, SVG-diagonal). b. Nuclear stress test 08/2012: normal, low risk.  . S/P CABG x 4 2007    LIMA-LAD, SVG-Distal RCA, SVG-Ramus, SVG-Diagonal; no echocardiogram done  . PAD (peripheral artery disease) (HCC) 1997    Mild-moderate carotid disease; status post right SFA occlusion with Fem-Below Knee Pop bypass (Dr. Hart Rochester); LEA Dopplers 07/2014: RIGHT - ABI 0.84, CIA/EIA 50-69%, CFA/PFA patent Fem-Pop bypass patent w/ 70-99% anastomotic stenosis, patent Pop A with 3 V runoff; LABI 0.85- L CIA/EIA patent, LPFA 70-99%, dLSFA 50-69%, patent LPopA & 3 V runoff.  . Renovascular hypertension 12/17/2013    a. s/p L RA Stent 11/2013. b. Renal  duplex 02/2015: >60% right proximal renal artery stenosis, normal left renal artery s/p stent, f/u 1 yr recommended.  . CKD (chronic kidney disease), stage III   . Essential hypertension   . Hyperlipidemia LDL goal <70   . Carotid artery disease (HCC)     a. Mild-mod carotid disease per Dr.Berry's note.  . Anemia     Assessment: Patient's a 80 y.o F presented to the ED on 2/10 with c/o SOB.  Troponin was found to be elevated in the ED and with new onset afib.  To start heparin for Afib and r/o ACS.  Patient reported no recent bleeding events. Now s/p right heart cath this am.  Today, 07/09/2015:  Heparin level therapeutic on 800 units/hr prior to cath. No issues with infusion per RN.  No bleeding noted. No hematoma noted post cath  CBC stable Plavix stopped by MD, continues on ASA 81 mg Advanced CKD, CrCl stable ~10 ml/min.  Awaiting decision from patient and family re: starting HD.   Goal of Therapy:  Heparin level 0.3-0.7 units/ml Monitor platelets by anticoagulation protocol: Yes   Plan:   Restart heparin at 800 units/hr 8 hours post sheath removal (1900 tonight)  Recheck heparin level in am  Daily CBC and  HL while on heparin infusion  F/u plans for transition to PO anticoag. Per cards, poor long-term candidate for [facility].  Sheppard Coil PharmD., BCPS Clinical Pharmacist Pager [phone] 07/09/2015 12:45 PM

## 2015-07-09 NOTE — Progress Notes (Signed)
[facility] Progress Note   Subjective:  Transferred from [facility] last PM Getting heart cath so out of the room at present Cardiology note indicates plans to place the trialysis catheter in the cath lab   She has dialysis orders for today  Filed Vitals:   07/09/15 0605 07/09/15 0613 07/09/15 0751 07/09/15 0907  BP:  144/94 141/89   Pulse:  88 67   Temp:   97.5 F (36.4 C)   TempSrc:   Oral   Resp:  14 26   Height:      Weight: 77.6 kg (171 lb 1.2 oz)     SpO2:  100% 100% 100%   Exam: To be completed when pt returns from the cath lab  Inpatient medications: . [MAR Hold] antiseptic oral rinse  7 mL Mouth Rinse BID  . [MAR Hold] aspirin EC  81 mg Oral Daily  . [MAR Hold] diphenhydrAMINE  50 mg Oral Once  . [MAR Hold] doxycycline (VIBRAMYCIN) IV  100 mg Intravenous Q12H  . [MAR Hold] folic acid  1 mg Oral Daily  . [MAR Hold] furosemide  80 mg Intravenous Q12H  . [MAR Hold] metoprolol  75 mg Oral BID  . [MAR Hold] multivitamin with minerals  1 tablet Oral Daily  . [MAR Hold] pantoprazole  40 mg Oral Daily  . [MAR Hold] sodium chloride flush  3 mL Intravenous Q12H  . sodium chloride flush  3 mL Intravenous Q12H  . [MAR Hold] thiamine  100 mg Oral Daily   Or  . [MAR Hold] thiamine  100 mg Intravenous Daily   Infusions . sodium chloride 10 mL/hr at 07/09/15 0608  . heparin 800 Units/hr (07/09/15 9147)   Prn medicines sodium chloride, [MAR Hold] sodium chloride, [MAR Hold] acetaminophen **OR** [MAR Hold] acetaminophen, fentaNYL, [MAR Hold] levalbuterol, [MAR Hold] LORazepam, midazolam, [MAR Hold] ondansetron **OR** [MAR Hold] ondansetron (ZOFRAN) IV, sodium chloride flush   Recent Labs Lab 07/07/15 0547 07/08/15 0256 07/09/15 0420  NA 130* 128* 132*  K 4.2 4.2 3.6  CL 91* 90* 91*  CO2 21* 21* 20*  GLUCOSE 141* 145* 189*  BUN 158* 171* 177*  CREATININE 4.65* 4.68* 4.61*  CALCIUM 7.9* 7.8* 7.9*  PHOS 8.5* 8.7* 8.7*    Recent Labs Lab 07/07/15 0547  07/08/15 0256 07/09/15 0420  AST 612* 502* 275*  ALT 815* 778* 666*  ALKPHOS 126 113 100  BILITOT 0.8 0.8 0.8  PROT 6.3* 6.1* 5.8*  ALBUMIN 3.7 3.6 3.3*    Recent Labs Lab 07/03/15 1236  07/07/15 0547 07/08/15 0256 07/09/15 0420  WBC 7.7  < > 8.7 8.8 8.8  NEUTROABS 5.8  --   --   --   --   HGB 10.3*  < > 11.0* 10.9* 10.7*  HCT 30.4*  < > 33.1* 32.3* 32.0*  MCV 96.2  < > 95.4 94.7 92.5  PLT 186  < > 202 199 198  < > = values in this interval not displayed.   Assessment: 1. SOB/ cough/ hypoxemia - felt to be combination of PNA + combined systolic/diastolic CHF.  Improved initially with diuresis. Very poor LV function (EF 20-25%), and new afib. CT of chest edema, small effusions, changes of bronchopneumonia. bilateral tree bud opacities ?bronchoPNA with atypical organisms, less likely edema or micronodular metastatic disease.  Doxy added by primary team. Current diuretics lasix 80 BID. R heart cath will help with volume assessment.  2. CKD4-> now CKD 5 (AKI on CKD4) - worsened renal fx  with diuresis for CHF, very low EF, new AF - cardiorenal component. Bump in creat from baseline mid 2's. Creatinine has increased 3.9->4.36->4.65, BUN 132->158->170's (steroids likely playing a role in the latter).  Lasix held for 24 hours without effect and restarted 2/14. Decided with family on a trial of dialysis (and this has facilitated ability to do heart cath. She understands that this could be permanent but that she will always have option of stopping. Had planned for IR cath placement, but cards note indicates will place in cath lab.  Right heart cath today to be done to assess volume status. Planning HD today regardless for significant azotemia;  3. CAD hx CABG with severe dilated / CM EF 20-25% -New onset afib/ aflutter - per primary/cards. Appears from notes that cards holding off on LEFT heart cath "until more definitive plan for kidney recovery" - not sure what that  means...  4. Pneumoperitoneum -CT was negative.   5. HTN meds  6. HL previously on alirocumab (Praluent) injections - Dr. Loreta Ave has recommended stopping  7. ID - no fever. CT chest changes of bronchopneumonaia (and they wonder about atypicals). On doxy  8. ^LFT's - etoh vs congestion. AST/ALT starting to fall   Camille Bal, MD [facility] [phone] Pager 07/09/2015, 10:01 AM

## 2015-07-09 NOTE — CV Procedure (Signed)
Findings:  RA = 7 RV = 37/1/5 PA = 36/17 (26) PCW = 26 with v-waves to 35 Fick cardiac output/index = 3.8/2.1  Ao sat = 98% PA sat = 55%, 56%  Assessment: 1. Moderately elevated filling pressure with moderately depressed cardiac output 2. Progressive renal failure, likely due in part to cardi-renal syndrome  Plan/Discussion:   RHC cath results as above. Successful placement of Trialysis catheter. Agree with proceeding with CVVHD. Can consider inotrope support as needed. Timing of coronary angio per Dr. Herbie Baltimore.   Bensimhon, Daniel,MD 10:36 AM

## 2015-07-10 LAB — COMPREHENSIVE METABOLIC PANEL
ALK PHOS: 95 U/L (ref 38–126)
ALT: 537 U/L — AB (ref 14–54)
ANION GAP: 16 — AB (ref 5–15)
AST: 131 U/L — ABNORMAL HIGH (ref 15–41)
Albumin: 3.3 g/dL — ABNORMAL LOW (ref 3.5–5.0)
BUN: 90 mg/dL — ABNORMAL HIGH (ref 6–20)
CALCIUM: 8.2 mg/dL — AB (ref 8.9–10.3)
CO2: 27 mmol/L (ref 22–32)
CREATININE: 2.99 mg/dL — AB (ref 0.44–1.00)
Chloride: 96 mmol/L — ABNORMAL LOW (ref 101–111)
GFR, EST AFRICAN AMERICAN: 16 mL/min — AB (ref >60)
GFR, EST NON AFRICAN AMERICAN: 14 mL/min — AB (ref >60)
Glucose, Bld: 124 mg/dL — ABNORMAL HIGH (ref 65–99)
Potassium: 3.4 mmol/L — ABNORMAL LOW (ref 3.5–5.1)
Sodium: 139 mmol/L (ref 135–145)
TOTAL PROTEIN: 5.7 g/dL — AB (ref 6.5–8.1)
Total Bilirubin: 1 mg/dL (ref 0.3–1.2)

## 2015-07-10 LAB — POCT I-STAT 3, VENOUS BLOOD GAS (G3P V)
ACID-BASE DEFICIT: 5 mmol/L — AB (ref 0.0–2.0)
Acid-base deficit: 4 mmol/L — ABNORMAL HIGH (ref 0.0–2.0)
BICARBONATE: 23.4 meq/L (ref 20.0–24.0)
Bicarbonate: 22.6 mEq/L (ref 20.0–24.0)
O2 SAT: 56 %
O2 Saturation: 55 %
PH VEN: 7.272 (ref 7.250–7.300)
TCO2: 24 mmol/L (ref 0–100)
TCO2: 25 mmol/L (ref 0–100)
pCO2, Ven: 49.9 mmHg (ref 45.0–50.0)
pCO2, Ven: 50.7 mmHg — ABNORMAL HIGH (ref 45.0–50.0)
pH, Ven: 7.264 (ref 7.250–7.300)
pO2, Ven: 33 mmHg (ref 30.0–45.0)
pO2, Ven: 34 mmHg (ref 30.0–45.0)

## 2015-07-10 LAB — CBC
HEMATOCRIT: 32.4 % — AB (ref 36.0–46.0)
HEMOGLOBIN: 11.1 g/dL — AB (ref 12.0–15.0)
MCH: 32 pg (ref 26.0–34.0)
MCHC: 34.3 g/dL (ref 30.0–36.0)
MCV: 93.4 fL (ref 78.0–100.0)
Platelets: 184 10*3/uL (ref 150–400)
RBC: 3.47 MIL/uL — ABNORMAL LOW (ref 3.87–5.11)
RDW: 13.3 % (ref 11.5–15.5)
WBC: 10.8 10*3/uL — ABNORMAL HIGH (ref 4.0–10.5)

## 2015-07-10 LAB — PHOSPHORUS: PHOSPHORUS: 5.7 mg/dL — AB (ref 2.5–4.6)

## 2015-07-10 LAB — HEPARIN LEVEL (UNFRACTIONATED): HEPARIN UNFRACTIONATED: 0.34 [IU]/mL (ref 0.30–0.70)

## 2015-07-10 LAB — GAMMA GT: GGT: 234 U/L — ABNORMAL HIGH (ref 7–50)

## 2015-07-10 LAB — MAGNESIUM: MAGNESIUM: 2.2 mg/dL (ref 1.7–2.4)

## 2015-07-10 MED ORDER — ACETAMINOPHEN 650 MG RE SUPP: 325.0000 mg | Freq: Four times a day (QID) | RECTAL | Status: DC | PRN

## 2015-07-10 MED ORDER — POTASSIUM CHLORIDE CRYS ER 20 MEQ PO TBCR
30.0000 meq | EXTENDED_RELEASE_TABLET | Freq: Once | ORAL | Status: AC
  Administered 2015-07-10: 30 meq via ORAL
  Filled 2015-07-10: qty 1

## 2015-07-10 MED ORDER — AMIODARONE HCL 200 MG PO TABS
400.0000 mg | ORAL_TABLET | Freq: Two times a day (BID) | ORAL | Status: DC
  Administered 2015-07-10 – 2015-07-19 (×20): 400 mg via ORAL
  Filled 2015-07-10 (×19): qty 2

## 2015-07-10 MED ORDER — LORAZEPAM 2 MG/ML IJ SOLN: 2.0000 mg | INTRAMUSCULAR | Status: DC | PRN

## 2015-07-10 MED ORDER — ACETAMINOPHEN 325 MG PO TABS
325.0000 mg | ORAL_TABLET | Freq: Four times a day (QID) | ORAL | Status: DC | PRN
  Administered 2015-07-14: 325 mg via ORAL
  Filled 2015-07-10: qty 1

## 2015-07-10 MED ORDER — DIPHENHYDRAMINE HCL 25 MG PO CAPS
25.0000 mg | ORAL_CAPSULE | Freq: Four times a day (QID) | ORAL | Status: DC | PRN
  Administered 2015-07-10 – 2015-07-11 (×4): 25 mg via ORAL
  Filled 2015-07-10 (×4): qty 1

## 2015-07-10 NOTE — Progress Notes (Signed)
Occupational Therapy Treatment Patient Details Name: [name] MRN: [id number] DOB: 06-Mar-1936 Today's Date: 07/10/2015    History of present illness 80 yo female admitted with acute on chronic renal failure, Afib. Hx of ETOH abuse   OT comments  This 80 yo female admitted with above presents to acute making progress since initial eval 4 days ago in areas of bed mobility, basic ADLs and ambulation. She will continue to benefit from acute OT with follow HHOT with family A/S 24/7 (with A any time she is up on her feet and then A prn for other tasks).   Follow Up Recommendations  Home health OT;Supervision/Assistance - 24 hour    Equipment Recommendations  Other (comment) (RW)       Precautions / Restrictions Precautions Precautions: Fall Precaution Comments: monitor sats, HR Restrictions Weight Bearing Restrictions: No       Mobility Bed Mobility Overal bed mobility: Needs Assistance Bed Mobility: Rolling;Sidelying to Sit Rolling: Supervision (HOB elevated and use of rail) Sidelying to sit: Supervision (HOB elevated and use of rail)          Transfers Overall transfer level: Needs assistance Equipment used: Rolling walker (2 wheeled) Transfers: Sit to/from Stand Sit to Stand: Min guard         General transfer comment: Pt ambulated with min A 30 feet with RW with occassional posterior lean at a quite slow pace    Balance Overall balance assessment: Needs assistance Sitting-balance support: No upper extremity supported;Feet supported Sitting balance-Leahy Scale: Fair     Standing balance support: Single extremity supported;During functional activity Standing balance-Leahy Scale: Poor Standing balance comment: Reliant on at least one UE support in standing                   ADL Overall ADL's : Needs assistance/impaired Eating/Feeding: Independent;Sitting   Grooming: Set up;Sitting               Lower Body Dressing: Min guard;Sit  to/from stand   Toilet Transfer: Minimal assistance;Ambulation;RW Toilet Transfer Details (indicate cue type and reason): bed>out door 30 feet>sit in recliner behind her                            Cognition   Behavior During Therapy: WFL for tasks assessed/performed Overall Cognitive Status: Within Functional Limits for tasks assessed                       Extremity/Trunk Assessment  Upper Extremity Assessment Upper Extremity Assessment: Generalized weakness                       Pertinent Vitals/ Pain       Pain Assessment: No/denies pain  Home Living Family/patient expects to be discharged to:: Private residence Living Arrangements: Spouse/significant other Available Help at Discharge: Family Type of Home:  (condo) Home Access: Stairs to enter Secretary/administrator of Steps: 2   Home Layout: Two level;Able to live on main level with bedroom/bathroom     Bathroom Shower/Tub: Walk-in shower;Door   Bathroom Toilet: Handicapped height     Home Equipment: Cane - single point;Shower seat;Grab bars - tub/shower;Hand held shower head;Grab bars - toilet          Prior Functioning/Environment Level of Independence: Independent            Frequency Min 2X/week     Progress Toward Goals  OT Goals(current goals  can now be found in the care plan section)  Progress towards OT goals:  (Goals set)     Plan Discharge plan remains appropriate       End of Session Equipment Utilized During Treatment: Gait belt;Rolling walker   Activity Tolerance Patient tolerated treatment well   Patient Left in chair;with call bell/phone within reach;with family/visitor present   Nurse Communication          Time: 2130-8657 OT Time Calculation (min): 34 min  Charges: OT General Charges $OT Visit: 1 Procedure OT Treatments $Self Care/Home Management : 8-22 mins $Therapeutic Activity: 8-22 mins  Evette Georges [phone] 07/10/2015, 12:15  PM

## 2015-07-10 NOTE — Progress Notes (Signed)
[facility] TEAM 1 - Stepdown/ICU TEAM PROGRESS NOTE  [name] GOT:[id number] DOB: 1936-04-14 DOA: 07/03/2015 PCP: Michiel Sites, MD  Admit HPI / Brief Narrative: 80 yo F Hx ETOH Abuse (5-6 glasses Liquor/day), coronary artery disease, peripheral vascular disease, and CKD III, who was in her usual state of health until sometime in December when she started developing a cough. She was prescribed an antibiotic regimen. Chest x-ray did not show a pneumonia. 4 days prior to her admit she went back to see her primary care physician c/o green sputum with cough. She had a fever up to 102F.   ED evaluation revealed elevated BNP and troponin. New-onset atrial fibrillation with rapid ventricular response. Lactic acid was also elevated. WBC was normal. She was noted to have acute on chronic renal failure.  Patient also noted to have a transaminitis.  HPI/Subjective: The patient is resting comfortably in a bedside chair.  She denies chest pain shortness breath fevers chills nausea or vomiting.  She does report poor appetite.  Assessment/Plan:  Hypothermia Resolved   Cough /shortness of breath / hypoxia  Influenza PCR negative - ?bronchopneumonia suggested by CT chest - currently off abx - follow clinically  Acute renal failure in setting of CKD 4 - Now CKD 5 Nephrology following and directing HD - acute failure felt to be due to cardiorenal synd - baseline crt ~2.5   Severe acute systolic CHF  EF 59-74% - volume control per HD - Cards following  CAD - CABG x4 2007 - Elevated troponin /NSTEMI Planning for cardiac cath early next week   Transaminitis - Alcohol Abuse Drinks 5-6 glasses of liquor per day - CIWA Protocol - holding  Alirocumab - viral hepatitis panel negative   New onset Atrial fibrillation with RVR TSH normal - rate reasonably controlled - IV heparin anticoag - Cards considering DCCV next week   HTN BP remains above range - follow w/ ongoing HD - care w/ med  titration to avoid post-HD hypotension   Overweight - Body mass index is 29.41 kg/(m^2).   PVD/PAD S/p fem/pop bypass R 1997 - RAS s/p L stent 2015  HLD  Hyperglycemia  Check A1c  Code Status: FULL Family Communication: spoke w/ husband and daughter at bedside  Disposition Plan: SDU   Consultants: Nephrology Cardiology   Procedures: 2/12 TTE - EF 20-25%. Diffuse hypokinesis. Akinesis anteroseptal and inferolateral myocardium. - Aortic valve: There was mild regurgitation - Mitral valve: moderateregurgitation - Left atrium:severely dilated. 2/16 [facility] HD Cath 2/16 R heart cath - mod elevated filling pressures   Antibiotics: cefepime 02/11 > 02/12 vancomycin 2/10 > 02/12 Doxycycline 2/15 > 2/16  DVT prophylaxis: IV Heparin   Objective: Blood pressure 136/97, pulse 90, temperature 98.1 F (36.7 C), temperature source Oral, resp. rate 16, height 5\' 3"  (1.6 m), weight 75.3 kg (166 lb 0.1 oz), SpO2 95 %.  Intake/Output Summary (Last 24 hours) at 07/10/15 0842 Last data filed at 07/10/15 0800  Gross per 24 hour  Intake    865 ml  Output   2500 ml  Net  -1635 ml   Exam: General: No acute respiratory distress - alert  Lungs: mild bibasilar crackles - no wheeze  Cardiovascular: irreg irreg - no appreciable gallup or M - rate controlled  Abdomen: Nontender, nondistended, soft, bowel sounds positive, no rebound, no ascites, no appreciable mass Extremities: No significant cyanosis, clubbing, or edema bilateral lower extremities  Data Reviewed:  Basic Metabolic Panel:  Recent Labs Lab 07/06/15 0615 07/07/15  1610 07/08/15 0256 07/09/15 0420 07/10/15 0425  NA 128* 130* 128* 132* 139  K 3.9 4.2 4.2 3.6 3.4*  CL 91* 91* 90* 91* 96*  CO2 20* 21* 21* 20* 27  GLUCOSE 130* 141* 145* 189* 124*  BUN 158* 158* 171* 177* 90*  CREATININE 4.36* 4.65* 4.68* 4.61* 2.99*  CALCIUM 7.5* 7.9* 7.8* 7.9* 8.2*  MG 2.2  --   --  2.6* 2.2  PHOS  --  8.5* 8.7* 8.7* 5.7*     CBC:  Recent Labs Lab 07/03/15 1236  07/06/15 0615 07/07/15 0547 07/08/15 0256 07/09/15 0420 07/10/15 0425  WBC 7.7  < > 8.2 8.7 8.8 8.8 10.8*  NEUTROABS 5.8  --   --   --   --   --   --   HGB 10.3*  < > 10.4* 11.0* 10.9* 10.7* 11.1*  HCT 30.4*  < > 30.7* 33.1* 32.3* 32.0* 32.4*  MCV 96.2  < > 95.6 95.4 94.7 92.5 93.4  PLT 186  < > 177 202 199 198 184  < > = values in this interval not displayed.  Liver Function Tests:  Recent Labs Lab 07/06/15 0615 07/07/15 0547 07/08/15 0256 07/09/15 0420 07/10/15 0425  AST 695* 612* 502* 275* 131*  ALT 722* 815* 778* 666* 537*  ALKPHOS 119 126 113 100 95  BILITOT 1.1 0.8 0.8 0.8 1.0  PROT 5.6* 6.3* 6.1* 5.8* 5.7*  ALBUMIN 3.2* 3.7 3.6 3.3* 3.3*   Coags:  Recent Labs Lab 07/03/15 1236 07/04/15 0427 07/09/15 0420  INR 1.41 1.53* 1.34   Cardiac Enzymes:  Recent Labs Lab 07/03/15 1236 07/03/15 1603 07/03/15 2247 07/04/15 0425 07/04/15 0952  CKTOTAL  --   --   --   --  318*  TROPONINI 1.09* 0.85* 0.54* 0.45*  --     Recent Results (from the past 240 hour(s))  Culture, blood (routine x 2)     Status: None   Collection Time: 07/03/15 12:32 PM  Result Value Ref Range Status   Specimen Description BLOOD RIGHT ANTECUBITAL  Final   Special Requests BOTTLES DRAWN AEROBIC AND ANAEROBIC 5CC  Final   Culture   Final    NO GROWTH 5 DAYS Performed at [facility]    Report Status 07/08/2015 FINAL  Final  Culture, blood (routine x 2)     Status: None   Collection Time: 07/03/15 12:35 PM  Result Value Ref Range Status   Specimen Description BLOOD LEFT ANTECUBITAL  Final   Special Requests BOTTLES DRAWN AEROBIC AND ANAEROBIC 5CC  Final   Culture   Final    NO GROWTH 5 DAYS Performed at [facility]    Report Status 07/08/2015 FINAL  Final  Culture, blood (Routine X 2) w Reflex to ID Panel     Status: None (Preliminary result)   Collection Time: 07/07/15  8:38 AM  Result Value Ref Range Status    Specimen Description BLOOD RIGHT ARM  Final   Special Requests BOTTLES DRAWN AEROBIC AND ANAEROBIC 5CC  Final   Culture   Final    NO GROWTH 2 DAYS Performed at [facility]    Report Status PENDING  Incomplete  Culture, blood (Routine X 2) w Reflex to ID Panel     Status: None (Preliminary result)   Collection Time: 07/07/15  8:47 AM  Result Value Ref Range Status   Specimen Description BLOOD RIGHT HAND  Final   Special Requests IN PEDIATRIC BOTTLE [facility]  Final  Culture   Final    NO GROWTH 2 DAYS Performed at [facility]    Report Status PENDING  Incomplete  MRSA PCR Screening     Status: None   Collection Time: 07/08/15  7:54 AM  Result Value Ref Range Status   MRSA by PCR NEGATIVE NEGATIVE Final    Comment:        The GeneXpert MRSA Assay (FDA approved for NASAL specimens only), is one component of a comprehensive MRSA colonization surveillance program. It is not intended to diagnose MRSA infection nor to guide or monitor treatment for MRSA infections.      Studies:   Recent x-ray studies have been reviewed in detail by the Attending Physician  Scheduled Meds:  Scheduled Meds: . antiseptic oral rinse  7 mL Mouth Rinse BID  . aspirin EC  81 mg Oral Daily  . feeding supplement (NEPRO CARB STEADY)  237 mL Oral Q24H  . folic acid  1 mg Oral Daily  . furosemide  80 mg Intravenous Q12H  . metoprolol  75 mg Oral BID  . multivitamin with minerals  1 tablet Oral Daily  . pantoprazole  40 mg Oral Daily  . sodium chloride flush  3 mL Intravenous Q12H  . sodium chloride flush  3 mL Intravenous Q12H  . sodium chloride flush  3 mL Intravenous Q12H  . thiamine  100 mg Oral Daily   Or  . thiamine  100 mg Intravenous Daily    Time spent on care of this patient: 35 mins   Kaydance Bowie T , MD   [facility] Office  [phone] Pager - Text Page per Loretha Stapler as per below:  On-Call/Text Page:      [email or web address]      password [email or web address]  If 7PM-7AM,  please contact night-coverage [email or web address] Password [email or web address] 07/10/2015, 8:42 AM   LOS: 7 days

## 2015-07-10 NOTE — Progress Notes (Signed)
Physical Therapy Treatment Patient Details Name: [name] MRN: [id number] DOB: [birth year] Today's Date: 07/10/2015    History of Present Illness 80 yo female admitted with acute on chronic renal failure, Afib. Hx of ETOH abuse    PT Comments    Patient seen for mobility progression, utilized RW and ambulated short distance in room and hall with 2 standing breaks. Current POC remains appropriate, encouraged patient to continue mobility with nsg staff over the course of the weekend. Will continue to see and progress as tolerated.   Follow Up Recommendations  Home health PT;Supervision/Assistance - 24 hour     Equipment Recommendations  Rolling walker with 5" wheels    Recommendations for Other Services       Precautions / Restrictions Precautions Precautions: Fall Precaution Comments: monitor sats, HR Restrictions Weight Bearing Restrictions: No    Mobility  Bed Mobility Overal bed mobility: Needs Assistance Bed Mobility: Supine to Sit;Sit to Supine Rolling: Supervision (HOB elevated and use of rail) Sidelying to sit: Supervision (HOB elevated and use of rail) Supine to sit: Supervision Sit to supine: Min assist   General bed mobility comments: Min assist to elevate LEs back to bed and reposition trunk  Transfers Overall transfer level: Needs assistance Equipment used: Rolling walker (2 wheeled) Transfers: Sit to/from Stand Sit to Stand: Min guard         General transfer comment: VCs for hand placement and safety   Ambulation/Gait Ambulation/Gait assistance: Min guard Ambulation Distance (Feet): 80 Feet Assistive device: Rolling walker (2 wheeled) Gait Pattern/deviations: Step-through pattern;Decreased stride length;Drifts right/left;Narrow base of support Gait velocity: decreased   General Gait Details: Modest instability, heavy reliance on RW, limited by overall fatigue (just received benedry)    Stairs            Wheelchair Mobility     Modified Rankin (Stroke Patients Only)       Balance Overall balance assessment: Needs assistance Sitting-balance support: No upper extremity supported Sitting balance-Leahy Scale: Fair     Standing balance support: Bilateral upper extremity supported Standing balance-Leahy Scale: Fair Standing balance comment: Reliant on at least one UE support in standing                    Cognition Arousal/Alertness: Awake/alert Behavior During Therapy: WFL for tasks assessed/performed Overall Cognitive Status: Within Functional Limits for tasks assessed                      Exercises      General Comments General comments (skin integrity, edema, etc.): educated on importance of continued mobility over the wkend      Pertinent Vitals/Pain Pain Assessment: No/denies pain    Home Living Family/patient expects to be discharged to:: Private residence Living Arrangements: Spouse/significant other Available Help at Discharge: Family Type of Home:  (condo) Home Access: Stairs to enter   Home Layout: Two level;Able to live on main level with bedroom/bathroom Home Equipment: Gilmer Mor - single point;Shower seat;Grab bars - tub/shower;Hand held shower head;Grab bars - toilet      Prior Function Level of Independence: Independent          PT Goals (current goals can now be found in the care plan section) Acute Rehab PT Goals Patient Stated Goal: not have this oxygen PT Goal Formulation: With patient/family Time For Goal Achievement: 07/19/15 Potential to Achieve Goals: Fair Progress towards PT goals: Progressing toward goals    Frequency  Min 3X/week  PT Plan Current plan remains appropriate    Co-evaluation             End of Session Equipment Utilized During Treatment: Gait belt Activity Tolerance: Patient limited by fatigue Patient left: in bed;with call bell/phone within reach;with bed alarm set     Time: 1419-1438 PT Time Calculation (min)  (ACUTE ONLY): 19 min  Charges:  $Gait Training: 8-22 mins                    G CodesFabio Asa 08/09/2015, 3:20 PM Charlotte Crumb, PT DPT  [phone]

## 2015-07-10 NOTE — Progress Notes (Signed)
ANTICOAGULATION CONSULT NOTE - Follow Up Consult  Pharmacy Consult for Heparin Indication: r/o ACS, atrial fibrillation  Allergies  Allergen Reactions  . Angiotensin Receptor Blockers   . Latex Swelling  . Statins Other (See Comments)    Diffuse cramping; has tried Lipitor, Crestor, Pravachol and simvastatin  . Benicar [Olmesartan] Itching and Rash    Patient Measurements: Height: 5\' 3"  (160 cm) Weight: 166 lb 0.1 oz (75.3 kg) IBW/kg (Calculated) : 52.4 Heparin Dosing Weight: 63kg  Vital Signs: Temp: 98.1 F (36.7 C) (02/17 0715) Temp Source: Oral (02/17 0715) BP: 150/94 mmHg (02/17 0939) Pulse Rate: 92 (02/17 0939)  Labs:  Recent Labs  07/08/15 0256  07/08/15 2015 07/09/15 0420 07/10/15 0425  HGB 10.9*  --   --  10.7* 11.1*  HCT 32.3*  --   --  32.0* 32.4*  PLT 199  --   --  198 184  LABPROT  --   --   --  16.7*  --   INR  --   --   --  1.34  --   HEPARINUNFRC 0.32  < > 0.37 0.39 0.34  CREATININE 4.68*  --   --  4.61* 2.99*  < > = values in this interval not displayed.  Estimated Creatinine Clearance: 15.3 mL/min (by C-G formula based on Cr of 2.99).   Medications:  Heparin @ 800 units/hr  Assessment: 79yof continued on heparin yesterday after her RHC for possible ACS and new afib. Heparin level is therapeutic. CBC stable. No bleeding reported. Noted plan for cath on Monday.  Goal of Therapy:  Heparin level 0.3-0.7 units/ml Monitor platelets by anticoagulation protocol: Yes   Plan:  1) Continue heparin at 800 units/hr 2) Heparin level, CBC in AM  Fredrik Rigger 07/10/2015,10:47 AM

## 2015-07-10 NOTE — Progress Notes (Signed)
Patient Name: [name] Date of Encounter: 07/10/2015  Hospital Problem List     Principal Problem:   Acute respiratory failure with hypoxia and hypercarbia (HCC) Active Problems:   CAD in native artery - CABG x4 (LIMA-LAD, SVG-distal RCA, SVG-diagonal, SVG-Ramus   Chronic renal insufficiency, stage III (moderate)   Acute on chronic renal failure (HCC)   Elevated troponin   Persistent atrial fibrillation (HCC)   New onset atrial fibrillation (HCC)   Congestive dilated cardiomyopathy (HCC)   Cardiorenal syndrome with renal failure   Hypertension, essential   Hyperlipidemia with target LDL less than 70   Renal artery stenosis, native, bilateral (HCC)   Transaminitis   Dyspnea   Unintentional weight loss   Lactic acidosis   Sepsis (HCC)   Community acquired pneumonia   Shortness of breath   SOB (shortness of breath)   Hypothermia   NSVT (nonsustained ventricular tachycardia) (HCC)    Subjective   Feels much better.  Breathing better. Sitting up on chair  HD overnight after RHC -Trialysis cath yesterday Eating some - doesn't like the food  Inpatient Medications    . antiseptic oral rinse  7 mL Mouth Rinse BID  . aspirin EC  81 mg Oral Daily  . feeding supplement (NEPRO CARB STEADY)  237 mL Oral Q24H  . folic acid  1 mg Oral Daily  . furosemide  80 mg Intravenous Q12H  . metoprolol  75 mg Oral BID  . multivitamin with minerals  1 tablet Oral Daily  . pantoprazole  40 mg Oral Daily  . sodium chloride flush  3 mL Intravenous Q12H  . sodium chloride flush  3 mL Intravenous Q12H  . sodium chloride flush  3 mL Intravenous Q12H  . thiamine  100 mg Oral Daily   Or  . thiamine  100 mg Intravenous Daily    Vital Signs    Filed Vitals:   07/10/15 0400 07/10/15 0425 07/10/15 0600 07/10/15 0715  BP: 131/104  157/102 136/97  Pulse: 121  99 90  Temp: 97.9 F (36.6 C)   98.1 F (36.7 C)  TempSrc: Oral   Oral  Resp: 17  21 16   Height:      Weight:  166 lb  0.1 oz (75.3 kg)    SpO2: 94%  97% 95%    Intake/Output Summary (Last 24 hours) at 07/10/15 0923 Last data filed at 07/10/15 0800  Gross per 24 hour  Intake    865 ml  Output   2500 ml  Net  -1635 ml   Filed Weights   07/09/15 1935 07/09/15 2305 07/10/15 0425  Weight: 172 lb 2.9 oz (78.1 kg) 166 lb 7.2 oz (75.5 kg) 166 lb 0.1 oz (75.3 kg)    Physical Exam    General: Well developed, well nourished WF in no acute distress. Sitting up in chair Head: Normocephalic, atraumatic, sclera non-icteric, no xanthomas, nares are without discharge. Neck: JVP not elevated.  RIJ HD cath in place - c/d/i Lungs: Diminished throughout, otherwise no rhonchi, wheezes or rales. Breathing is unlabored. Heart: Irregularly irregular, rate controlled, S1 & S2 normal, 2/6 HSN @ apex, rubs, or gallops.  Abdomen: Soft, non-tender, non-distended with normoactive bowel sounds. No rebound/guarding. Extremities: No clubbing or cyanosis. No edema. Distal pedal pulses are 2+ and equal bilaterally. Neuro: Alert and oriented X 3. Moves all extremities spontaneously. Psych: Responds to questions appropriately with a normal affect.   Labs    CBC  Recent Labs  07/09/15  1610 07/10/15 0425  WBC 8.8 10.8*  HGB 10.7* 11.1*  HCT 32.0* 32.4*  MCV 92.5 93.4  PLT 198 184   Basic Metabolic Panel  Recent Labs  07/09/15 0420 07/10/15 0425  NA 132* 139  K 3.6 3.4*  CL 91* 96*  CO2 20* 27  GLUCOSE 189* 124*  BUN 177* 90*  CREATININE 4.61* 2.99*  CALCIUM 7.9* 8.2*  MG 2.6* 2.2  PHOS 8.7* 5.7*   Liver Function Tests  Recent Labs  07/09/15 0420 07/10/15 0425  AST 275* 131*  ALT 666* 537*  ALKPHOS 100 95  BILITOT 0.8 1.0  PROT 5.8* 5.7*  ALBUMIN 3.3* 3.3*   No results for input(s): LIPASE, AMYLASE in the last 72 hours. Cardiac Enzymes No results for input(s): CKTOTAL, CKMB, CKMBINDEX, TROPONINI in the last 72 hours. BNP Invalid input(s): POCBNP D-Dimer No results for input(s): DDIMER in the  last 72 hours. Hemoglobin A1C No results for input(s): HGBA1C in the last 72 hours. Fasting Lipid Panel No results for input(s): CHOL, HDL, LDLCALC, TRIG, CHOLHDL, LDLDIRECT in the last 72 hours. Thyroid Function Tests No results for input(s): TSH, T4TOTAL, T3FREE, THYROIDAB in the last 72 hours.  Invalid input(s): FREET3  Telemetry   Afib - rate controlled  ECG    No new EKG  CARDIOLOGY    Echo 2/12: mild focal basal hypertrophy, Severely reduced LV Fxn - EF ~20-25%, diffuse HK with AD of Anteroseptal & inferoseptal wall.  Mod MR. Severe LA dilated, Moderately reduced RV fxn.    RHC 2/17: Findings:  RA = 7 RV = 37/1/5 PA = 36/17 (26) PCW = 26 with v-waves to 35 Fick cardiac output/index = 3.8/2.1  Ao sat = 98% PA sat = 55%, 56%  Assessment: 1. Moderately elevated filling pressure with moderately depressed cardiac output 2. Progressive renal failure, likely due in part to cardi-renal syndrome  Plan/Discussion: RHC cath results as above. Successful placement of Trialysis catheter. Agree with proceeding with CVVHD. Can consider inotrope support as needed. Timing of coronary angio per Dr. Herbie Baltimore.   Assessment & Plan    Principal Problem:   Acute respiratory failure with hypoxia and hypercarbia (HCC) - combined ?PNA & Acute Combined S&DHF  Symptomatically improving with diuresis/HD volume reduction.  Off Abx as of yesterday  Active Problems:    Congestive dilated cardiomyopathy (HCC)  Newly identified reduced EF - Unclear of etiology --> plan for LHC ~Monday to define Coronary/Graft anatomy  RHC now shows moderately reduced CO & moderately elevated filling pressures --> hopefully relieved with HD volume removal  If no new CAD lesions, would expect that reduced EF is related to Afib --> pending LHC results will plan to attempt DCCV of Afib ~Tuesday    CAD in native artery - CABG x4 (LIMA-LAD, SVG-distal RCA, SVG-diagonal, SVG-Ramus  Elevated troponin - With  mild troponin elvation & new Dx of severely reduced LVEF & regional WMA on Echo, will need to pursue ischemic evaluation.  => Will plan for LHC on Monday to allow time for HD & potential renal recovery  Defining coronary anatomy will allow [place] to determine appropriate AC for Afib & need for DCCV    Chronic renal insufficiency, stage III (moderate) -- Acute on chronic renal failure (HCC) --> Now V (Severe)   Complicated by:  Cardiorenal syndrome with renal failure  Renal artery stenosis, native, bilateral (HCC)  HD cath placed yesterday -> plan is HD over weekend (perhaps with reduction of volume, renal Fxn will improve)  Holding off on LHC to allow for recovery of renal Fxn.  Need to optimize Cardiac output.;    New onset atrial fibrillation (HCC) /Persistent atrial fibrillation (HCC)  Unsure of duration. Rate controlled with BB   ON IV Heparin for [facility] pending LHC Monday & potential TEE/DCCV Tuesday  Will load with PO Amiodarone over the weekend to allow for facilitated DCCV (not a good option for long-term antiarrhythmic with Transaminitis.      Hypertension, essential - stable.    Hyperlipidemia with target LDL less than 70 - Praluent on hold 2/2 elevated LFTs; will need to reassess as OP.    Transaminitis - ? Congestive hepatopathy combined with [facility] & ? Concern for Praluent effect --> levels improving.   Signed, Marykay Lex, M.D., M.S. Interventional Cardiologist   Pager # [phone] Phone # [phone] [address] [place], [place] [address]

## 2015-07-10 NOTE — Progress Notes (Addendum)
[facility] Progress Note   Subjective:  Tolerated HD better than I expected yesterday 1.5 liters off (plus a liter of UOP yesterday - not so much today) Much brighter and more alert and less SOB! Sitting on edge of bed, sats 95% on RA   Filed Vitals:   07/10/15 0600 07/10/15 0715 07/10/15 0800 07/10/15 0939  BP: 157/102 136/97 150/94 150/94  Pulse: 99 90 63 92  Temp:  98.1 F (36.7 C)    TempSrc:  Oral    Resp: 21 16 14    Height:      Weight:      SpO2: 97% 95% 96%    Exam: General: Well developed, well nourished WF in no acute distress. Sitting on the edge of the bed. Neck: JVP not elevated. RIJ HD cath in place (2/16) Lungs: Diminished throughout, otherwise no rhonchi, wheezes or rales.  Heart: Irregularly irregular, S1 & S2 normal, 2/6 apical murmur but no rubs, or gallop.  Abdomen: Soft, non-tender, non-distended with normoactive bowel sounds.  Extremities:  No edema.  Neuro: Alert and oriented X 3.  Psych: Responds to questions appropriately and is much more alert and interactive  Inpatient medications: . amiodarone  400 mg Oral BID  . antiseptic oral rinse  7 mL Mouth Rinse BID  . aspirin EC  81 mg Oral Daily  . feeding supplement (NEPRO CARB STEADY)  237 mL Oral Q24H  . folic acid  1 mg Oral Daily  . furosemide  80 mg Intravenous Q12H  . metoprolol  75 mg Oral BID  . multivitamin with minerals  1 tablet Oral Daily  . pantoprazole  40 mg Oral Daily  . sodium chloride flush  3 mL Intravenous Q12H  . thiamine  100 mg Oral Daily   Or  . thiamine  100 mg Intravenous Daily   Infusions . heparin 800 Units/hr (07/09/15 2249)   Prn medicines sodium chloride, acetaminophen **OR** acetaminophen, levalbuterol, LORazepam, ondansetron **OR** ondansetron (ZOFRAN) IV   Recent Labs Lab 07/08/15 0256 07/09/15 0420 07/10/15 0425  NA 128* 132* 139  K 4.2 3.6 3.4*  CL 90* 91* 96*  CO2 21* 20* 27  GLUCOSE 145* 189* 124*  BUN 171* 177* 90*   CREATININE 4.68* 4.61* 2.99*  CALCIUM 7.8* 7.9* 8.2*  PHOS 8.7* 8.7* 5.7*    Recent Labs Lab 07/08/15 0256 07/09/15 0420 07/10/15 0425  AST 502* 275* 131*  ALT 778* 666* 537*  ALKPHOS 113 100 95  BILITOT 0.8 0.8 1.0  PROT 6.1* 5.8* 5.7*  ALBUMIN 3.6 3.3* 3.3*    Recent Labs Lab 07/03/15 1236  07/08/15 0256 07/09/15 0420 07/10/15 0425  WBC 7.7  < > 8.8 8.8 10.8*  NEUTROABS 5.8  --   --   --   --   HGB 10.3*  < > 10.9* 10.7* 11.1*  HCT 30.4*  < > 32.3* 32.0* 32.4*  MCV 96.2  < > 94.7 92.5 93.4  PLT 186  < > 199 198 184  < > = values in this interval not displayed.   Assessment: 1. SOB/ cough/ hypoxemia - felt to be combination of PNA + combined systolic/diastolic CHF. Very poor LV function (EF 20-25%), and new afib. CT of chest edema, small effusions, changes of bronchopneumonia. bilateral tree bud opacities ?bronchoPNA with atypical organisms, less likely edema or micronodular metastatic disease. ATB's completed. Right heart cath confirmed high filling pressures, probable cardiorenal component to her renal failure and she is better after a dialysis treatment with  1.5 liters off.   2. CKD4 (baseline creatinine around 2-2.5, known RAS, s/p L RA Stent 11/2013. Renal duplex 02/2015: >60% right proximal renal artery stenosis, normal left renal artery s/p stent, -> now CKD 5 (AKI on CKD4) - worsened renal fx with diuresis for CHF, very low EF, new AF - cardiorenal component. Bump in creat from baseline mid 2's. Decided with family on a trial of dialysis (and this has facilitated ability to do heart cath) and she did VERY well with HD yesterday. She understands that this could be permanent but that she will always have option of stopping. Will plan for another treatment on Saturday (didn't finish up first treatment until late last night). Have left lasix at 80 Q12H just to see if can still effect urine output. Still need to discuss addressing her renal arteries...   3. Hypokalemia -  3.4 - have ordered 1 dose of KDur 30 for today and will use 4K bath with HD tomorrow   4. New AF - for possible TEE/DCCV Tuesday of next week. Amio load started  5. CAD hx CABG with severe dilated / CM EF 20-25% -For left heart cath on Monday.  6. Pneumoperitoneum -CT was negative.   7. HTN meds  8. HL previously on alirocumab (Praluent) injections - Dr. Loreta Ave has recommended stopping  9. ID - no fever. CT chest changes of bronchopneumonaia (and they wonder about atypicals). No ATB's now  10. ^LFT's - etoh vs congestion. AST/ALT starting to fall  11. Alcohol abuse 12. HLD - currently off praluent  Camille Bal, MD [facility] [phone] Pager 07/10/2015, 10:13 AM

## 2015-07-11 DIAGNOSIS — I5043 Acute on chronic combined systolic (congestive) and diastolic (congestive) heart failure: Secondary | ICD-10-CM

## 2015-07-11 LAB — CBC
HEMATOCRIT: 34.1 % — AB (ref 36.0–46.0)
HEMOGLOBIN: 11.4 g/dL — AB (ref 12.0–15.0)
MCH: 32 pg (ref 26.0–34.0)
MCHC: 33.4 g/dL (ref 30.0–36.0)
MCV: 95.8 fL (ref 78.0–100.0)
Platelets: 201 10*3/uL (ref 150–400)
RBC: 3.56 MIL/uL — AB (ref 3.87–5.11)
RDW: 13.7 % (ref 11.5–15.5)
WBC: 12 10*3/uL — AB (ref 4.0–10.5)

## 2015-07-11 LAB — COMPREHENSIVE METABOLIC PANEL
ALBUMIN: 3.3 g/dL — AB (ref 3.5–5.0)
ALK PHOS: 96 U/L (ref 38–126)
ALT: 382 U/L — AB (ref 14–54)
AST: 70 U/L — AB (ref 15–41)
Anion gap: 17 — ABNORMAL HIGH (ref 5–15)
BILIRUBIN TOTAL: 1.2 mg/dL (ref 0.3–1.2)
BUN: 104 mg/dL — AB (ref 6–20)
CALCIUM: 8.8 mg/dL — AB (ref 8.9–10.3)
CO2: 25 mmol/L (ref 22–32)
CREATININE: 3.5 mg/dL — AB (ref 0.44–1.00)
Chloride: 97 mmol/L — ABNORMAL LOW (ref 101–111)
GFR calc Af Amer: 13 mL/min — ABNORMAL LOW (ref >60)
GFR calc non Af Amer: 12 mL/min — ABNORMAL LOW (ref >60)
GLUCOSE: 98 mg/dL (ref 65–99)
Potassium: 3.7 mmol/L (ref 3.5–5.1)
SODIUM: 139 mmol/L (ref 135–145)
TOTAL PROTEIN: 5.7 g/dL — AB (ref 6.5–8.1)

## 2015-07-11 LAB — HEPATITIS PANEL, ACUTE
HEP A IGM: NEGATIVE
HEP B C IGM: NEGATIVE
HEP B S AG: NEGATIVE

## 2015-07-11 LAB — MAGNESIUM: Magnesium: 2.1 mg/dL (ref 1.7–2.4)

## 2015-07-11 LAB — PHOSPHORUS: Phosphorus: 5.2 mg/dL — ABNORMAL HIGH (ref 2.5–4.6)

## 2015-07-11 LAB — HEPARIN LEVEL (UNFRACTIONATED): Heparin Unfractionated: 0.31 IU/mL (ref 0.30–0.70)

## 2015-07-11 MED ORDER — TEMAZEPAM 15 MG PO CAPS
15.0000 mg | ORAL_CAPSULE | Freq: Once | ORAL | Status: AC
  Administered 2015-07-12: 15 mg via ORAL
  Filled 2015-07-11: qty 1

## 2015-07-11 MED ORDER — DIPHENHYDRAMINE HCL 25 MG PO CAPS
25.0000 mg | ORAL_CAPSULE | ORAL | Status: DC | PRN
  Administered 2015-07-11 – 2015-07-17 (×13): 25 mg via ORAL
  Filled 2015-07-11 (×11): qty 1

## 2015-07-11 MED ORDER — DIPHENHYDRAMINE HCL 25 MG PO CAPS
ORAL_CAPSULE | ORAL | Status: AC
  Filled 2015-07-11: qty 1

## 2015-07-11 NOTE — Procedures (Signed)
I have personally attended this patient's dialysis session.   UF goal 1.5 liters 4K bath Tolerating well so far R IJ trialysis cath  Camille Bal, MD [facility] [phone] Pager 07/11/2015, 4:01 PM

## 2015-07-11 NOTE — Progress Notes (Signed)
ANTICOAGULATION CONSULT NOTE - Follow Up Consult  Pharmacy Consult for Heparin Indication: r/o ACS, atrial fibrillation  Allergies  Allergen Reactions  . Angiotensin Receptor Blockers   . Latex Swelling  . Statins Other (See Comments)    Diffuse cramping; has tried Lipitor, Crestor, Pravachol and simvastatin  . Benicar [Olmesartan] Itching and Rash    Patient Measurements: Height: 5\' 3"  (160 cm) Weight: 165 lb 8 oz (75.07 kg) IBW/kg (Calculated) : 52.4 Heparin Dosing Weight: 63kg  Vital Signs: Temp: 97.6 F (36.4 C) (02/18 0900) Temp Source: Oral (02/18 0900) BP: 154/59 mmHg (02/18 0800) Pulse Rate: 63 (02/18 0900)  Labs:  Recent Labs  07/09/15 0420 07/10/15 0425 07/11/15 0455 07/11/15 0500  HGB 10.7* 11.1*  --  11.4*  HCT 32.0* 32.4*  --  34.1*  PLT 198 184  --  201  LABPROT 16.7*  --   --   --   INR 1.34  --   --   --   HEPARINUNFRC 0.39 0.34 0.31  --   CREATININE 4.61* 2.99*  --  3.50*    Estimated Creatinine Clearance: 13.1 mL/min (by C-G formula based on Cr of 3.5).   Assessment: 79yof continued on heparin after her RHC on 2/16 for possible ACS and new afib. Planning for [facility] on Monday. Heparin level is on low end of therapeutic. CBC stable. No bleeding reported.  Goal of Therapy:  Heparin level 0.3-0.7 units/ml Monitor platelets by anticoagulation protocol: Yes   Plan:  1) Increased heparin slightly to 850 units/hr 2) Heparin level, CBC in AM  Greggory Stallion, PharmD Clinical Pharmacy Resident Pager # [phone] 07/11/2015 9:45 AM

## 2015-07-11 NOTE — Progress Notes (Signed)
Patient converted to sinus rhythm. HR 60-70s. Pt denies c/o pain but inability to go back to sleep. Heparin gtt still infusing at 800 units per hour. Call bell in reach of patient.

## 2015-07-11 NOTE — Progress Notes (Addendum)
SUBJECTIVE: The patient is doing well today.  At this time, she denies chest pain, shortness of breath, or any new concerns.    Marland Kitchen amiodarone  400 mg Oral BID  . antiseptic oral rinse  7 mL Mouth Rinse BID  . aspirin EC  81 mg Oral Daily  . feeding supplement (NEPRO CARB STEADY)  237 mL Oral Q24H  . folic acid  1 mg Oral Daily  . furosemide  80 mg Intravenous Q12H  . metoprolol  75 mg Oral BID  . multivitamin with minerals  1 tablet Oral Daily  . pantoprazole  40 mg Oral Daily  . sodium chloride flush  3 mL Intravenous Q12H  . thiamine  100 mg Oral Daily   Or  . thiamine  100 mg Intravenous Daily   . heparin 800 Units/hr (07/11/15 0800)    OBJECTIVE: Physical Exam: Filed Vitals:   07/11/15 0600 07/11/15 0700 07/11/15 0800 07/11/15 0900  BP:   154/59   Pulse: 47 69 68 63  Temp:      TempSrc:      Resp: 18 15 19 21   Height:      Weight:      SpO2: 96% 98% 97% 94%    Intake/Output Summary (Last 24 hours) at 07/11/15 0915 Last data filed at 07/11/15 0800  Gross per 24 hour  Intake    424 ml  Output    200 ml  Net    224 ml    Telemetry reveals afib converted to sinus rhythm overnight  GEN- The patient is well appearing, alert and oriented x 3 today.   Head- normocephalic, atraumatic Eyes-  Sclera clear, conjunctiva pink Ears- hearing intact Oropharynx- clear Neck- supple, CVL catheter in place Lungs- bibasilar rales, normal work of breathing Heart- Regular rate and rhythm  GI- soft, NT, ND, + BS Extremities- no clubbing, cyanosis, +edema Skin- no rash or lesion Psych- euthymic mood, full affect Neuro- strength and sensation are intact  LABS: Basic Metabolic Panel:  Recent Labs  84/53/64 0425 07/11/15 0500  NA 139 139  K 3.4* 3.7  CL 96* 97*  CO2 27 25  GLUCOSE 124* 98  BUN 90* 104*  CREATININE 2.99* 3.50*  CALCIUM 8.2* 8.8*  MG 2.2 2.1  PHOS 5.7* 5.2*   Liver Function Tests:  Recent Labs  07/10/15 0425 07/11/15 0500  AST 131* 70*  ALT  537* 382*  ALKPHOS 95 96  BILITOT 1.0 1.2  PROT 5.7* 5.7*  ALBUMIN 3.3* 3.3*   No results for input(s): LIPASE, AMYLASE in the last 72 hours. CBC:  Recent Labs  07/10/15 0425 07/11/15 0500  WBC 10.8* 12.0*  HGB 11.1* 11.4*  HCT 32.4* 34.1*  MCV 93.4 95.8  PLT 184 201    ASSESSMENT AND PLAN:    1. SOB/ cough/ hypoxemia - multifactoral (advanced and decompensated renal failure, acute on chronic combined systolic/diastolic CHF, afib) clinically improved with dialysis.   Continue to monitor.  Could transfer to telemetry from my standpoint  2. Acute on chronic renal failure (now CKD 5-->AKI on CKD4).  Per Dr Eliott Nine, there is concerned about R renal artery stenosis.  May benefit from renal angiogram at time of cath next week.  Tolerating dialysis very well and much improved clinically.  As she continues to improve with dialysis, would not advise inotropes presently.   3. Persistent atrial fibrillation- she has converted to sinus rhythm with fluid removal.  Suggests afib may be secondary to volume issues.  On heparin drip.  Would start coumadin post cath.  Continue on amiodarone.  Given renal failure, would not advise NOAC therapy.  4.  CAD hx CABG with severe dilated / CM EF 20-25% -For left heart cath on Monday per Dr Herbie Baltimore.  Off of ace inhibitor due to renal failure/ RAS.  Previously intolerant to statins.    5. HTN- stable, will convert metoprolol to coreg  6.  HL previously on alirocumab (Praluent) injections - Dr. Loreta Ave has recommended stopping  OK to transfer to telemetry if renal is agreeable   Hillis Range, MD 07/11/2015 9:15 AM   Addendum: Sherron Monday with Dr Eliott Nine Plan is for dialysis today. She is ok with transfer to telemetry as well as LHC on Monday.  She also requests that we perform renal angiogram at time of cath if possible to assess previous L renal stent and concerns for R renal artery stenosis on recent duplex.  She is hopeful that if further renal  stenosis is observed, correction could help with improving long term renal function.  Hillis Range MD, [facility] 07/11/2015 9:34 AM

## 2015-07-11 NOTE — Progress Notes (Signed)
[facility] Progress Note   Subjective:  Tolerated 1st HD better than I expected 2/16  1.5 liters off  Has converted to NSR Hopes can dialyze early today  Agreeable to proceeding with cath next week Told her and family I also think we need to look at her renal arteries (see notes below) Still making urine but with rapid rise in creatinine since first HD   Filed Vitals:   07/11/15 0600 07/11/15 0700 07/11/15 0800 07/11/15 0900  BP:   154/59   Pulse: 47 69 68 63  Temp:    97.6 F (36.4 C)  TempSrc:    Oral  Resp: Height:      Weight:      SpO2: 96% 98% 97% 94%   I/O last 3 completed shifts: In: 616 [P.O.:240; I.V.:376] Out: 2250 [Urine:750; Other:1500] Total I/O In: 16 [I.V.:16] Out: -    Exam: General: Well developed, well nourished WF in no acute distress.  Neck: JVP not elevated. RIJ HD cath in place (2/16) Lungs: Crackles posteriorly.  Heart: Irregularly irregular, S1 & S2 normal, 2/6 apical murmur but no rubs, or gallop.  Abdomen: Soft, non-tender, non-distended with normoactive bowel sounds.  Extremities:  No significant edema.  Neuro: Alert and oriented X 3.  Psych: Responds to questions appropriately and is much more alert and interactive  Inpatient medications: . amiodarone  400 mg Oral BID  . antiseptic oral rinse  7 mL Mouth Rinse BID  . aspirin EC  81 mg Oral Daily  . feeding supplement (NEPRO CARB STEADY)  237 mL Oral Q24H  . folic acid  1 mg Oral Daily  . furosemide  80 mg Intravenous Q12H  . metoprolol  75 mg Oral BID  . multivitamin with minerals  1 tablet Oral Daily  . pantoprazole  40 mg Oral Daily  . sodium chloride flush  3 mL Intravenous Q12H  . thiamine  100 mg Oral Daily   Or  . thiamine  100 mg Intravenous Daily   Infusions . heparin 800 Units/hr (07/11/15 0800)   Prn medicines sodium chloride, acetaminophen **OR** acetaminophen, diphenhydrAMINE, levalbuterol, LORazepam, ondansetron **OR**  ondansetron (ZOFRAN) IV   Recent Labs Lab 07/09/15 0420 07/10/15 0425 07/11/15 0500  NA 132* 139 139  K 3.6 3.4* 3.7  CL 91* 96* 97*  CO2 20* 27 25  GLUCOSE 189* 124* 98  BUN 177* 90* 104*  CREATININE 4.61* 2.99* 3.50*  CALCIUM 7.9* 8.2* 8.8*  PHOS 8.7* 5.7* 5.2*    Recent Labs Lab 07/09/15 0420 07/10/15 0425 07/11/15 0500  AST 275* 131* 70*  ALT 666* 537* 382*  ALKPHOS 100 95 96  BILITOT 0.8 1.0 1.2  PROT 5.8* 5.7* 5.7*  ALBUMIN 3.3* 3.3* 3.3*    Recent Labs Lab 07/09/15 0420 07/10/15 0425 07/11/15 0500  WBC 8.8 10.8* 12.0*  HGB 10.7* 11.1* 11.4*  HCT 32.0* 32.4* 34.1*  MCV 92.5 93.4 95.8  PLT 198 184 201     Assessment: 1. SOB/ cough/ hypoxemia - combination of PNA + combined systolic/diastolic CHF. Very poor LV function (EF 20-25%), and new afib. CT of chest edema, small effusions, changes of bronchopneumonia. bilateral tree bud opacities ?bronchoPNA with atypical organisms, less likely edema or micronodular metastatic disease. ATB's completed. Right heart cath confirmed high filling pressures, probable cardiorenal component to her renal failure and she is clinically improved better dialysis  with 1.5 liters off.   2. CKD4 (baseline creatinine around 2-2.5, known RAS, s/p  L RA Stent 11/2013. Renal duplex 02/2015: >60% right proximal renal artery stenosis, normal left renal artery s/p stent), -> now CKD 5 (AKI on CKD4) - worsened renal fx with diuresis for CHF, very low EF, new AF - cardiorenal component. Bump in creat from baseline mid 2's. Decided with family on a trial of dialysis (and this has facilitated ability to do cardiac evaluation) and she did VERY well with HD yesterday. She understands that this could be permanent but that she will always have option of stopping. To have treatment today. Will increase UF goal to 1.5 liters. Have left lasix at 80 Q12H  to see if can still effect urine output. Making urine, just no clearance. Still need to address renal  artery situation  3. Bilateral renal artery stenosis with left renal artery stent and last duplex suggestive of "60-99% reduction in diameter of RRA. Might be the only evaluation/possible intervention that could offer some salvage of renal function. Discussed with Dr. Johney Frame.  4. Hypokalemia - 4K bath with HD today  5. New AF - On amio,. Has spontaneously converted to SR  6. CAD hx CABG with severe dilated / CM EF 20-25% -For left heart cath on Monday.  7. HTN meds  8. HL previously on alirocumab (Praluent) injections - Dr. Loreta Ave has recommended stopping  9. ID - no fever. CT chest changes of bronchopneumonaia (and they wonder about atypicals). No ATB's now  10. ^LFT's - etoh vs congestion. AST/ALT starting to fall  11. Alcohol abuse 12. HLD - currently off praluent   Camille Bal, MD [facility] [phone] Pager 07/11/2015, 9:37 AM

## 2015-07-11 NOTE — Progress Notes (Signed)
[place] TEAM 1 - Stepdown/ICU TEAM PROGRESS NOTE  [name] YBW:[id number] DOB: 15-Jan-1936 DOA: 07/03/2015 PCP: Michiel Sites, MD  Admit HPI / Brief Narrative: 80 yo F Hx ETOH Abuse (5-6 glasses Liquor/day), coronary artery disease, peripheral vascular disease, and CKD III, who was in her usual state of health until sometime in December when she started developing a cough. She was prescribed an antibiotic regimen. Chest x-ray did not show a pneumonia. 4 days prior to her admit she went back to see her primary care physician c/o green sputum with cough. She had a fever up to 102F.   ED evaluation revealed elevated BNP and troponin. New-onset atrial fibrillation with rapid ventricular response. Lactic acid was also elevated. WBC was normal. She was noted to have acute on chronic renal failure.  Patient also noted to have a transaminitis.  HPI/Subjective: The patient is seen in the dialysis unit.  She is in good spirits.  She tells me she feels much better overall today.  She denies nausea vomiting fevers chills shortness of breath or abdominal pain.  She does report significant anxiety which she normally treats with Benadryl at home.  Assessment/Plan:  Hypothermia Resolved   Cough /shortness of breath / hypoxia  Influenza PCR negative - ?bronchopneumonia suggested by CT chest - currently off abx - follow clinically - no symptoms to suggest persisting infection presently but white count is climbing so will follow  Acute renal failure in setting of CKD 4 - Now CKD 5 Nephrology following and directing HD - acute failure felt to be due to cardiorenal synd - baseline crt ~2.5  Bilateral renal artery stenosis left renal artery stent and last duplex suggestive of 60-99% reduction in diameter of RRA - plan is for evaluation of renal arteries with cardiac cath next week  Severe acute systolic CHF  EF 76-81% - volume control per HD - Cards following - no acute respiratory  distress presently  CAD - CABG x4 2007 - Elevated troponin /NSTEMI Planning for cardiac cath early next week   Transaminitis - Alcohol Abuse Drinks 5-6 glasses of liquor per day - currently outside of probable alcohol withdrawal window - holding  Alirocumab - viral hepatitis panel negative - LFTs improving  New onset Atrial fibrillation with RVR TSH normal - converted to NSR w/ addition of amiodarone and with volume removal per hemodialysis - IV heparin anticoag - Cards following   HTN BP remains above range - follow w/ ongoing HD - care w/ med titration to avoid post-HD hypotension   Overweight - Body mass index is 29.18 kg/(m^2).   PVD/PAD S/p fem/pop bypass R 1997 - RAS s/p L stent 2015  HLD  Hyperglycemia  Check A1c in AM  Code Status: FULL Family Communication: No family present at time of exam today in hemodialysis unit Disposition Plan: Stable for transfer to renal telemetry bed   Consultants: Nephrology Cardiology   Procedures: 2/12 TTE - EF 20-25%. Diffuse hypokinesis. Akinesis anteroseptal and inferolateral myocardium. - Aortic valve: There was mild regurgitation - Mitral valve: moderateregurgitation - Left atrium:severely dilated. 2/16 LIJ HD Cath 2/16 R heart cath - mod elevated filling pressures   Antibiotics: cefepime 02/11 > 02/12 vancomycin 2/10 > 02/12 Doxycycline 2/15 > 2/16  DVT prophylaxis: IV Heparin   Objective: Blood pressure 162/71, pulse 68, temperature 97.5 F (36.4 C), temperature source Oral, resp. rate 19, height 5\' 3"  (1.6 m), weight 74.7 kg (164 lb 10.9 oz), SpO2 97 %.  Intake/Output Summary (Last 24  hours) at 07/11/15 1457 Last data filed at 07/11/15 0800  Gross per 24 hour  Intake    144 ml  Output    200 ml  Net    -56 ml   Exam: General: No acute respiratory distress - alert and pleasant Lungs: Clear to auscultation without wheezes  Cardiovascular: Regular rate and rhythm without appreciable murmur  Abdomen: Nontender,  nondistended, soft, bowel sounds positive, no rebound Extremities: No significant cyanosis, or clubbing;  trace edema bilateral lower extremities  Data Reviewed:  Basic Metabolic Panel:  Recent Labs Lab 07/06/15 0615 07/07/15 0547 07/08/15 0256 07/09/15 0420 07/10/15 0425 07/11/15 0500  NA 128* 130* 128* 132* 139 139  K 3.9 4.2 4.2 3.6 3.4* 3.7  CL 91* 91* 90* 91* 96* 97*  CO2 20* 21* 21* 20* 27 25  GLUCOSE 130* 141* 145* 189* 124* 98  BUN 158* 158* 171* 177* 90* 104*  CREATININE 4.36* 4.65* 4.68* 4.61* 2.99* 3.50*  CALCIUM 7.5* 7.9* 7.8* 7.9* 8.2* 8.8*  MG 2.2  --   --  2.6* 2.2 2.1  PHOS  --  8.5* 8.7* 8.7* 5.7* 5.2*    CBC:  Recent Labs Lab 07/07/15 0547 07/08/15 0256 07/09/15 0420 07/10/15 0425 07/11/15 0500  WBC 8.7 8.8 8.8 10.8* 12.0*  HGB 11.0* 10.9* 10.7* 11.1* 11.4*  HCT 33.1* 32.3* 32.0* 32.4* 34.1*  MCV 95.4 94.7 92.5 93.4 95.8  PLT 202 199 198 184 201    Liver Function Tests:  Recent Labs Lab 07/07/15 0547 07/08/15 0256 07/09/15 0420 07/10/15 0425 07/11/15 0500  AST 612* 502* 275* 131* 70*  ALT 815* 778* 666* 537* 382*  ALKPHOS 126 113 100 95 96  BILITOT 0.8 0.8 0.8 1.0 1.2  PROT 6.3* 6.1* 5.8* 5.7* 5.7*  ALBUMIN 3.7 3.6 3.3* 3.3* 3.3*   Coags:  Recent Labs Lab 07/09/15 0420  INR 1.34     Recent Results (from the past 240 hour(s))  Culture, blood (routine x 2)     Status: None   Collection Time: 07/03/15 12:32 PM  Result Value Ref Range Status   Specimen Description BLOOD RIGHT ANTECUBITAL  Final   Special Requests BOTTLES DRAWN AEROBIC AND ANAEROBIC 5CC  Final   Culture   Final    NO GROWTH 5 DAYS Performed at [facility]    Report Status 07/08/2015 FINAL  Final  Culture, blood (routine x 2)     Status: None   Collection Time: 07/03/15 12:35 PM  Result Value Ref Range Status   Specimen Description BLOOD LEFT ANTECUBITAL  Final   Special Requests BOTTLES DRAWN AEROBIC AND ANAEROBIC 5CC  Final   Culture   Final     NO GROWTH 5 DAYS Performed at [facility]    Report Status 07/08/2015 FINAL  Final  Culture, blood (Routine X 2) w Reflex to ID Panel     Status: None (Preliminary result)   Collection Time: 07/07/15  8:38 AM  Result Value Ref Range Status   Specimen Description BLOOD RIGHT ARM  Final   Special Requests BOTTLES DRAWN AEROBIC AND ANAEROBIC 5CC  Final   Culture   Final    NO GROWTH 4 DAYS Performed at [facility]    Report Status PENDING  Incomplete  Culture, blood (Routine X 2) w Reflex to ID Panel     Status: None (Preliminary result)   Collection Time: 07/07/15  8:47 AM  Result Value Ref Range Status   Specimen Description BLOOD RIGHT HAND  Final   Special Requests IN PEDIATRIC BOTTLE 3CC  Final   Culture   Final    NO GROWTH 4 DAYS Performed at [facility]    Report Status PENDING  Incomplete  MRSA PCR Screening     Status: None   Collection Time: 07/08/15  7:54 AM  Result Value Ref Range Status   MRSA by PCR NEGATIVE NEGATIVE Final    Comment:        The GeneXpert MRSA Assay (FDA approved for NASAL specimens only), is one component of a comprehensive MRSA colonization surveillance program. It is not intended to diagnose MRSA infection nor to guide or monitor treatment for MRSA infections.      Studies:   Recent x-ray studies have been reviewed in detail by the Attending Physician  Scheduled Meds:  Scheduled Meds: . amiodarone  400 mg Oral BID  . antiseptic oral rinse  7 mL Mouth Rinse BID  . aspirin EC  81 mg Oral Daily  . feeding supplement (NEPRO CARB STEADY)  237 mL Oral Q24H  . folic acid  1 mg Oral Daily  . furosemide  80 mg Intravenous Q12H  . metoprolol  75 mg Oral BID  . multivitamin with minerals  1 tablet Oral Daily  . pantoprazole  40 mg Oral Daily  . sodium chloride flush  3 mL Intravenous Q12H  . thiamine  100 mg Oral Daily   Or  . thiamine  100 mg Intravenous Daily    Time spent on care of this patient: 35  mins   MCCLUNG,JEFFREY T , MD   [facility] Office  [phone] Pager - Text Page per Loretha Stapler as per below:  On-Call/Text Page:      [email or web address]      password [email or web address]  If 7PM-7AM, please contact night-coverage [email or web address] Password [email or web address] 07/11/2015, 2:57 PM   LOS: 8 days

## 2015-07-12 ENCOUNTER — Inpatient Hospital Stay (HOSPITAL_COMMUNITY): Payer: Medicare Other

## 2015-07-12 LAB — CBC
HEMATOCRIT: 35.5 % — AB (ref 36.0–46.0)
HEMATOCRIT: 35.7 % — AB (ref 36.0–46.0)
HEMOGLOBIN: 11.2 g/dL — AB (ref 12.0–15.0)
Hemoglobin: 11.3 g/dL — ABNORMAL LOW (ref 12.0–15.0)
MCH: 30.6 pg (ref 26.0–34.0)
MCH: 30.8 pg (ref 26.0–34.0)
MCHC: 31.5 g/dL (ref 30.0–36.0)
MCHC: 31.7 g/dL (ref 30.0–36.0)
MCV: 97 fL (ref 78.0–100.0)
MCV: 97.3 fL (ref 78.0–100.0)
PLATELETS: 202 10*3/uL (ref 150–400)
Platelets: 191 10*3/uL (ref 150–400)
RBC: 3.66 MIL/uL — AB (ref 3.87–5.11)
RBC: 3.67 MIL/uL — AB (ref 3.87–5.11)
RDW: 13.9 % (ref 11.5–15.5)
RDW: 14.1 % (ref 11.5–15.5)
WBC: 12.5 10*3/uL — AB (ref 4.0–10.5)
WBC: 12 10*3/uL — AB (ref 4.0–10.5)

## 2015-07-12 LAB — RENAL FUNCTION PANEL
ALBUMIN: 3.1 g/dL — AB (ref 3.5–5.0)
ANION GAP: 15 (ref 5–15)
BUN: 45 mg/dL — ABNORMAL HIGH (ref 6–20)
CALCIUM: 8.6 mg/dL — AB (ref 8.9–10.3)
CO2: 25 mmol/L (ref 22–32)
CREATININE: 2.61 mg/dL — AB (ref 0.44–1.00)
Chloride: 99 mmol/L — ABNORMAL LOW (ref 101–111)
GFR calc non Af Amer: 17 mL/min — ABNORMAL LOW (ref >60)
GFR, EST AFRICAN AMERICAN: 19 mL/min — AB (ref >60)
Glucose, Bld: 108 mg/dL — ABNORMAL HIGH (ref 65–99)
PHOSPHORUS: 4.1 mg/dL (ref 2.5–4.6)
Potassium: 4.2 mmol/L (ref 3.5–5.1)
SODIUM: 139 mmol/L (ref 135–145)

## 2015-07-12 LAB — CULTURE, BLOOD (ROUTINE X 2)
CULTURE: NO GROWTH
Culture: NO GROWTH

## 2015-07-12 LAB — HEPARIN LEVEL (UNFRACTIONATED): HEPARIN UNFRACTIONATED: 0.36 [IU]/mL (ref 0.30–0.70)

## 2015-07-12 MED ORDER — SODIUM CHLORIDE 0.9% FLUSH
3.0000 mL | Freq: Two times a day (BID) | INTRAVENOUS | Status: DC
  Administered 2015-07-12: 3 mL via INTRAVENOUS

## 2015-07-12 MED ORDER — SODIUM CHLORIDE 0.9 % IV SOLN: INTRAVENOUS | Status: DC

## 2015-07-12 MED ORDER — ASPIRIN 81 MG PO CHEW
81.0000 mg | CHEWABLE_TABLET | ORAL | Status: AC
  Administered 2015-07-13: 81 mg via ORAL
  Filled 2015-07-12: qty 1

## 2015-07-12 MED ORDER — SODIUM CHLORIDE 0.9% FLUSH: 3.0000 mL | INTRAVENOUS | Status: DC | PRN

## 2015-07-12 MED ORDER — SODIUM CHLORIDE 0.9 % IV SOLN: 250.0000 mL | INTRAVENOUS | Status: DC | PRN

## 2015-07-12 NOTE — Progress Notes (Signed)
[place] TEAM 2  PROGRESS NOTE  RAYLAN SPINNATO DQQ:[id number] DOB: 09-21-1935 DOA: 07/03/2015 PCP: Michiel Sites, MD  Admit HPI / Brief Narrative: 80 yo F Hx ETOH Abuse (5-6 glasses Liquor/day), coronary artery disease, peripheral vascular disease, and CKD III, who was in her usual state of health until sometime in December when she started developing a cough. She was prescribed an antibiotic regimen. Chest x-ray did not show a pneumonia. 4 days prior to her admit she went back to see her primary care physician c/o green sputum with cough. She had a fever up to 102F.   ED evaluation revealed elevated BNP and troponin. New-onset atrial fibrillation with rapid ventricular response. Lactic acid was also elevated. WBC was normal. She was noted to have acute on chronic renal failure.  Patient also noted to have a transaminitis.  HPI/Subjective: The patient has no new complaints   Assessment/Plan:  Hypothermia Resolved   Cough /shortness of breath / hypoxia  Influenza PCR negative - ?bronchopneumonia suggested by CT chest - currently off abx - follow clinically - no symptoms to suggest persisting infection presently but white count is climbing so will follow  Acute renal failure in setting of CKD 4 - Now CKD 5 Nephrology following and directing HD - acute failure felt to be due to cardiorenal synd - baseline crt ~2.5  Bilateral renal artery stenosis left renal artery stent and last duplex suggestive of 60-99% reduction in diameter of RRA - plan is for evaluation of renal arteries with cardiac cath next week  Severe acute systolic CHF  EF 19-41% - volume control per HD - Cards following - no acute respiratory distress presently  CAD - CABG x4 2007 - Elevated troponin /NSTEMI Planning for cardiac cath early next week   Transaminitis - Alcohol Abuse Drinks 5-6 glasses of liquor per day - currently outside of probable alcohol withdrawal window - holding  Alirocumab - viral  hepatitis panel negative - LFTs improving on last check  New onset Atrial fibrillation with RVR TSH normal - converted to NSR w/ addition of amiodarone and with volume removal per hemodialysis - IV heparin anticoag - Cards following   HTN stable - follow w/ ongoing HD - care w/ med titration to avoid post-HD hypotension   Overweight - Body mass index is 28.32 kg/(m^2).   PVD/PAD S/p fem/pop bypass R 1997 - RAS s/p L stent 2015  HLD  Hyperglycemia  Check A1c in AM  Code Status: FULL Family Communication: d/c family at bedside Disposition Plan: pending improvement in condition  Consultants: Nephrology Cardiology   Procedures: 2/12 TTE - EF 20-25%. Diffuse hypokinesis. Akinesis anteroseptal and inferolateral myocardium. - Aortic valve: There was mild regurgitation - Mitral valve: moderateregurgitation - Left atrium:severely dilated. 2/16 [facility] HD Cath 2/16 R heart cath - mod elevated filling pressures   Antibiotics: cefepime 02/11 > 02/12 vancomycin 2/10 > 02/12 Doxycycline 2/15 > 2/16  DVT prophylaxis: IV Heparin   Objective: Blood pressure 134/69, pulse 59, temperature 97.6 F (36.4 C), temperature source Oral, resp. rate 18, height 5\' 3"  (1.6 m), weight 72.5 kg (159 lb 13.3 oz), SpO2 99 %.  Intake/Output Summary (Last 24 hours) at 07/12/15 1736 Last data filed at 07/12/15 0600  Gross per 24 hour  Intake      0 ml  Output      0 ml  Net      0 ml   Exam: General: No acute respiratory distress - alert and pleasant Lungs: Clear to  auscultation without wheezes , equal chest rise Cardiovascular: Regular rate and rhythm without appreciable murmur  Abdomen: Nontender, nondistended, soft, bowel sounds positive, no rebound Extremities: No significant cyanosis, or clubbing;  trace edema bilateral lower extremities  Data Reviewed:  Basic Metabolic Panel:  Recent Labs Lab 07/06/15 0615  07/08/15 0256 07/09/15 0420 07/10/15 0425 07/11/15 0500 07/12/15 0543  NA  128*  < > 128* 132* 139 139 139  K 3.9  < > 4.2 3.6 3.4* 3.7 4.2  CL 91*  < > 90* 91* 96* 97* 99*  CO2 20*  < > 21* 20* 27 25 25   GLUCOSE 130*  < > 145* 189* 124* 98 108*  BUN 158*  < > 171* 177* 90* 104* 45*  CREATININE 4.36*  < > 4.68* 4.61* 2.99* 3.50* 2.61*  CALCIUM 7.5*  < > 7.8* 7.9* 8.2* 8.8* 8.6*  MG 2.2  --   --  2.6* 2.2 2.1  --   PHOS  --   < > 8.7* 8.7* 5.7* 5.2* 4.1  < > = values in this interval not displayed.  CBC:  Recent Labs Lab 07/08/15 0256 07/09/15 0420 07/10/15 0425 07/11/15 0500 07/12/15 0543  WBC 8.8 8.8 10.8* 12.0* 12.5*  HGB 10.9* 10.7* 11.1* 11.4* 11.2*  HCT 32.3* 32.0* 32.4* 34.1* 35.5*  MCV 94.7 92.5 93.4 95.8 97.0  PLT 199 198 184 201 191    Liver Function Tests:  Recent Labs Lab 07/07/15 0547 07/08/15 0256 07/09/15 0420 07/10/15 0425 07/11/15 0500 07/12/15 0543  AST 612* 502* 275* 131* 70*  --   ALT 815* 778* 666* 537* 382*  --   ALKPHOS 126 113 100 95 96  --   BILITOT 0.8 0.8 0.8 1.0 1.2  --   PROT 6.3* 6.1* 5.8* 5.7* 5.7*  --   ALBUMIN 3.7 3.6 3.3* 3.3* 3.3* 3.1*   Coags:  Recent Labs Lab 07/09/15 0420  INR 1.34     Recent Results (from the past 240 hour(s))  Culture, blood (routine x 2)     Status: None   Collection Time: 07/03/15 12:32 PM  Result Value Ref Range Status   Specimen Description BLOOD RIGHT ANTECUBITAL  Final   Special Requests BOTTLES DRAWN AEROBIC AND ANAEROBIC 5CC  Final   Culture   Final    NO GROWTH 5 DAYS Performed at [facility]    Report Status 07/08/2015 FINAL  Final  Culture, blood (routine x 2)     Status: None   Collection Time: 07/03/15 12:35 PM  Result Value Ref Range Status   Specimen Description BLOOD LEFT ANTECUBITAL  Final   Special Requests BOTTLES DRAWN AEROBIC AND ANAEROBIC 5CC  Final   Culture   Final    NO GROWTH 5 DAYS Performed at [facility]    Report Status 07/08/2015 FINAL  Final  Culture, blood (Routine X 2) w Reflex to ID Panel     Status: None  (Preliminary result)   Collection Time: 07/07/15  8:38 AM  Result Value Ref Range Status   Specimen Description BLOOD RIGHT ARM  Final   Special Requests BOTTLES DRAWN AEROBIC AND ANAEROBIC 5CC  Final   Culture   Final    NO GROWTH 4 DAYS Performed at [facility]    Report Status PENDING  Incomplete  Culture, blood (Routine X 2) w Reflex to ID Panel     Status: None (Preliminary result)   Collection Time: 07/07/15  8:47 AM  Result Value Ref  Range Status   Specimen Description BLOOD RIGHT HAND  Final   Special Requests IN PEDIATRIC BOTTLE 3CC  Final   Culture   Final    NO GROWTH 4 DAYS Performed at [facility]    Report Status PENDING  Incomplete  MRSA PCR Screening     Status: None   Collection Time: 07/08/15  7:54 AM  Result Value Ref Range Status   MRSA by PCR NEGATIVE NEGATIVE Final    Comment:        The GeneXpert MRSA Assay (FDA approved for NASAL specimens only), is one component of a comprehensive MRSA colonization surveillance program. It is not intended to diagnose MRSA infection nor to guide or monitor treatment for MRSA infections.       Scheduled Meds:  Scheduled Meds: . amiodarone  400 mg Oral BID  . antiseptic oral rinse  7 mL Mouth Rinse BID  . feeding supplement (NEPRO CARB STEADY)  237 mL Oral Q24H  . folic acid  1 mg Oral Daily  . furosemide  80 mg Intravenous Q12H  . metoprolol  75 mg Oral BID  . multivitamin with minerals  1 tablet Oral Daily  . pantoprazole  40 mg Oral Daily  . sodium chloride flush  3 mL Intravenous Q12H  . thiamine  100 mg Oral Daily    Time spent on care of this patient: 35 mins   Penny Pia , MD   [facility] Office  [phone] Pager - Text Page per Loretha Stapler as per below:  On-Call/Text Page:      [email or web address]      password [email or web address]  If 7PM-7AM, please contact night-coverage [email or web address] Password [facility] 07/12/2015, 5:36 PM   LOS: 9 days

## 2015-07-12 NOTE — Progress Notes (Signed)
ANTICOAGULATION CONSULT NOTE - Follow Up Consult  Pharmacy Consult for Heparin Indication: r/o ACS, atrial fibrillation  Allergies  Allergen Reactions  . Angiotensin Receptor Blockers   . Latex Swelling  . Statins Other (See Comments)    Diffuse cramping; has tried Lipitor, Crestor, Pravachol and simvastatin  . Benicar [Olmesartan] Itching and Rash    Patient Measurements: Height: 5\' 3"  (160 cm) Weight: 159 lb 13.3 oz (72.5 kg) IBW/kg (Calculated) : 52.4 Heparin Dosing Weight: 63kg  Vital Signs: Temp: 97.8 F (36.6 C) (02/19 0522) Temp Source: Oral (02/19 0522) BP: 155/73 mmHg (02/19 0522) Pulse Rate: 63 (02/19 0522)  Labs:  Recent Labs  07/10/15 0425 07/11/15 0455 07/11/15 0500 07/12/15 0543  HGB 11.1*  --  11.4* 11.2*  HCT 32.4*  --  34.1* 35.5*  PLT 184  --  201 191  HEPARINUNFRC 0.34 0.31  --  0.36  CREATININE 2.99*  --  3.50* 2.61*    Estimated Creatinine Clearance: 17.2 mL/min (by C-G formula based on Cr of 2.61).   Assessment: 79yof continued on heparin after her RHC on 2/16 for possible ACS and new afib. Planning for [facility] on Monday.  HL therapeutic at 0.36, Hgb 11.2, Plts 192, no s/sx bleeding noted.   Goal of Therapy:  Heparin level 0.3-0.7 units/ml Monitor platelets by anticoagulation protocol: Yes   Plan:  - Continue heparin gtt 850 units/hr -Daily HL, CBC -Monitor for s/sx of bleeding  Hazle Nordmann, PharmD Pharmacy Resident [phone]  07/12/2015 8:56 AM

## 2015-07-12 NOTE — Progress Notes (Signed)
[facility] Progress Note   Subjective:  Has had HD X2 Post HD weight yesterday down 74.7->to 72.4 with net UF of 1.5 liters She tolerated it well Today having issues with nausea and "can't get comfortable" Anticipating L heart cath tomorrow but no notes from cards yet  Filed Vitals:   07/11/15 1802 07/11/15 2010 07/12/15 0522 07/12/15 0900  BP: 158/71 158/79 155/73 152/70  Pulse: 62 69 63 66  Temp: 97.7 F (36.5 C) 98 F (36.7 C) 97.8 F (36.6 C) 97.9 F (36.6 C)  TempSrc: Oral Oral Oral Oral  Resp: Height:      Weight: 72.4 kg (159 lb 9.8 oz) 72.5 kg (159 lb 13.3 oz)    SpO2: 99% 99% 98% 100%   I/O last 3 completed shifts: In: 104 [I.V.:104] Out: 1500 [Other:1500]    Exam: General: Well developed, well nourished WF in no acute distress.  Neck: RIJ HD cath in place (2/16) Lungs: Coarse crackles R ant chest and few R base Heart:Regular, S1 & S2 normal, 2/6 apical murmur but no rubs, or gallop.  Abdomen: Soft, non-tender, non-distended with normoactive bowel sounds.  Extremities:  No significant edema.  Neuro: Alert and oriented X 3.    Inpatient medications: . amiodarone  400 mg Oral BID  . antiseptic oral rinse  7 mL Mouth Rinse BID  . aspirin EC  81 mg Oral Daily  . feeding supplement (NEPRO CARB STEADY)  237 mL Oral Q24H  . folic acid  1 mg Oral Daily  . furosemide  80 mg Intravenous Q12H  . metoprolol  75 mg Oral BID  . multivitamin with minerals  1 tablet Oral Daily  . pantoprazole  40 mg Oral Daily  . sodium chloride flush  3 mL Intravenous Q12H  . thiamine  100 mg Oral Daily   Infusions . heparin 850 Units/hr (07/12/15 0753)   Prn medicines sodium chloride, acetaminophen **OR** acetaminophen, diphenhydrAMINE, levalbuterol, ondansetron **OR** ondansetron (ZOFRAN) IV   Recent Labs Lab 07/10/15 0425 07/11/15 0500 07/12/15 0543  NA 139 139 139  K 3.4* 3.7 4.2  CL 96* 97* 99*  CO2 GLUCOSE 124* 98 108*   BUN 90* 104* 45*  CREATININE 2.99* 3.50* 2.61*  CALCIUM 8.2* 8.8* 8.6*  PHOS 5.7* 5.2* 4.1    Recent Labs Lab 07/09/15 0420 07/10/15 0425 07/11/15 0500 07/12/15 0543  AST 275* 131* 70*  --   ALT 666* 537* 382*  --   ALKPHOS 100 95 96  --   BILITOT 0.8 1.0 1.2  --   PROT 5.8* 5.7* 5.7*  --   ALBUMIN 3.3* 3.3* 3.3* 3.1*    Recent Labs Lab 07/10/15 0425 07/11/15 0500 07/12/15 0543  WBC 10.8* 12.0* 12.5*  HGB 11.1* 11.4* 11.2*  HCT 32.4* 34.1* 35.5*  MCV 93.4 95.8 97.0  PLT 184 201 191   Background 80 y.o. year-old with hx of CAD/ CABG, PAD, HTN , HL and CKD. Followed by Dr. Briant Cedar for CKD with baseline creat -of 2, RAS (L RA stent and last duplex 2016 >60% right RAS)  She presented to ED with SOB, recurrent URI since December, wheezing with  2-3 prior rounds of abx w/o relief. Came to ED where RA SaO2 was 87%.Hospital eval demonstrated new drop in EF to 20-25%, new onset Afib, CT with bronchopneumonia. With diuresis creatinine progressive rose to around 4.6, BUN in the 170's. Cardiology felt hands tied in terms of their eval  unless pt agreeable to HD and after long discussion with pt and family was agreeable. Pt transferred to [facility] for further eval. R heart cath confirmed high filling pressures and supported cardiorenal component to AKI. HD was initiated 2/16 via temp cath placed that date with understanding that this could be permanent RF. L heart cath pending. Also in need of reassessment of her known RAS   Assessment: 1. SOB/ cough/ hypoxemia - combination of PNA + combined systolic/diastolic CHF. Very poor LV function (EF 20-25%), and new afib. CT of chest - edema, small effusions, changes of bronchopneumonia. Bilateral tree bud opacities ?bronchoPNA with atypical organisms, less likely edema or micronodular metastatic disease. ATB's completed. Right heart cath confirmed high filling pressures, probable cardiorenal component to her renal failure and she is clinically  improved better dialysis a net of 3 liters off in 2 treatments. Today she has some new coarse crackles R ant chest - with rising WBC will go ahead and repeat a CXR today. If volume will need HD tomorrow.    2. CKD4 (baseline creatinine around 2-2.5, known RAS, s/p L RA Stent 11/2013. Renal duplex 02/2015: >60% right proximal renal artery stenosis, normal left renal artery s/p stent), -> now CKD 5 (AKI on CKD4) - worsened renal fx with diuresis for CHF, very low EF, new AF - cardiorenal component. Bump in creat from baseline mid 2's. Decided with family on a trial of dialysis (and this has facilitated ability to do cardiac evaluation) and she has done very well with HD. She understands that this could be permanent but that she will always have option of stopping. UOP has dropped off to very little. Diuretics stopped.  Last HD #2 was 2/18 (Saturday)   3. Bilateral renal artery stenosis with left renal artery stent and last duplex suggestive of "60-99% reduction in diameter of RRA. Might be the only evaluation/possible intervention that could offer some salvage of renal function. Discussed with Dr. Johney Frame. We haven't decided on final plan (options look at during cath vs CO2 angio at separate sitting)   4. New AF - On amio,. Has spontaneously converted to SR. Still on IV heparin  5. CAD hx CABG with severe dilated / CM EF 20-25% -For left heart cath on Monday.  6. HTN meds  7. HL previously on alirocumab (Praluent) injections - Dr. Loreta Ave recommended stopping  8. ID - no fever. CT chest changes of bronchopneumonaia (and they wonder about atypicals). No ATB's now. New R chest crackles on exam today. Evaluate with CXR.   9. ^LFT's - etoh vs congestion. AST/ALT improving    10. Alcohol abuse    Camille Bal, MD [facility] [phone] Pager 07/12/2015, 12:48 PM

## 2015-07-12 NOTE — Progress Notes (Signed)
    SUBJECTIVE:  No chest pain.  No SOB   PHYSICAL EXAM Filed Vitals:   07/11/15 1802 07/11/15 2010 07/12/15 0522 07/12/15 0900  BP: 158/71 158/79 155/73 152/70  Pulse: 62 69 63 66  Temp: 97.7 F (36.5 C) 98 F (36.7 C) 97.8 F (36.6 C) 97.9 F (36.6 C)  TempSrc: Oral Oral Oral Oral  Resp: 18 19 18 18   Height:      Weight: 159 lb 9.8 oz (72.4 kg) 159 lb 13.3 oz (72.5 kg)    SpO2: 99% 99% 98% 100%   General:  No distress Lungs:  Clear Heart:  RRR Abdomen:  Positive bowel sounds, no rebound no guarding Extremities:  No edema   LABS:  Results for orders placed or performed during the hospital encounter of 07/03/15 (from the past 24 hour(s))  CBC     Status: Abnormal   Collection Time: 07/12/15  5:43 AM  Result Value Ref Range   WBC 12.5 (H) 4.0 - 10.5 K/uL   RBC 3.66 (L) 3.87 - 5.11 MIL/uL   Hemoglobin 11.2 (L) 12.0 - 15.0 g/dL   HCT 33.6 (L) 12.2 - 44.9 %   MCV 97.0 78.0 - 100.0 fL   MCH 30.6 26.0 - 34.0 pg   MCHC 31.5 30.0 - 36.0 g/dL   RDW 75.3 00.5 - 11.0 %   Platelets 191 150 - 400 K/uL  Heparin level (unfractionated)     Status: None   Collection Time: 07/12/15  5:43 AM  Result Value Ref Range   Heparin Unfractionated 0.36 0.30 - 0.70 IU/mL  Renal function panel     Status: Abnormal   Collection Time: 07/12/15  5:43 AM  Result Value Ref Range   Sodium 139 135 - 145 mmol/L   Potassium 4.2 3.5 - 5.1 mmol/L   Chloride 99 (L) 101 - 111 mmol/L   CO2 25 22 - 32 mmol/L   Glucose, Bld 108 (H) 65 - 99 mg/dL   BUN 45 (H) 6 - 20 mg/dL   Creatinine, Ser 2.11 (H) 0.44 - 1.00 mg/dL   Calcium 8.6 (L) 8.9 - 10.3 mg/dL   Phosphorus 4.1 2.5 - 4.6 mg/dL   Albumin 3.1 (L) 3.5 - 5.0 g/dL   GFR calc non Af Amer 17 (L) >60 mL/min   GFR calc Af Amer 19 (L) >60 mL/min   Anion gap 15 5 - 15    Intake/Output Summary (Last 24 hours) at 07/12/15 1327 Last data filed at 07/12/15 0600  Gross per 24 hour  Intake      0 ml  Output   1500 ml  Net  -1500 ml     ASSESSMENT AND  PLAN:  CAD:  Plan for left heart cath tomorrow.    CKD:  Discussed with Dr. Eliott Nine.  It looks unlikely that she will recover kidney function.  She is anuric. The plan now is to ask Dr. Herbie Baltimore to inject the renal arteries at the time of the coronary angiogram.  I discussed this with the patient and her family today.  They understand the increased risk of permanent dialysis.    CM:  Seems to be euvolemic through dialysis.    Rollene Rotunda 07/12/2015 1:27 PM

## 2015-07-13 ENCOUNTER — Encounter (HOSPITAL_COMMUNITY)
Admission: EM | Disposition: A | Discharge status: Home Health | Payer: Self-pay | Source: Home / Self Care | Attending: Internal Medicine

## 2015-07-13 HISTORY — PX: CARDIAC CATHETERIZATION: SHX172

## 2015-07-13 HISTORY — PX: PERIPHERAL VASCULAR CATHETERIZATION: SHX172C

## 2015-07-13 LAB — RENAL FUNCTION PANEL
Albumin: 3.1 g/dL — ABNORMAL LOW (ref 3.5–5.0)
Anion gap: 17 — ABNORMAL HIGH (ref 5–15)
BUN: 73 mg/dL — AB (ref 6–20)
CALCIUM: 8.7 mg/dL — AB (ref 8.9–10.3)
CO2: 22 mmol/L (ref 22–32)
Chloride: 100 mmol/L — ABNORMAL LOW (ref 101–111)
Creatinine, Ser: 3.56 mg/dL — ABNORMAL HIGH (ref 0.44–1.00)
GFR, EST AFRICAN AMERICAN: 13 mL/min — AB (ref >60)
GFR, EST NON AFRICAN AMERICAN: 11 mL/min — AB (ref >60)
GLUCOSE: 96 mg/dL (ref 65–99)
PHOSPHORUS: 5 mg/dL — AB (ref 2.5–4.6)
Potassium: 4.3 mmol/L (ref 3.5–5.1)
Sodium: 139 mmol/L (ref 135–145)

## 2015-07-13 LAB — HEMOGLOBIN A1C
Hgb A1c MFr Bld: 5.8 % — ABNORMAL HIGH (ref 4.8–5.6)
Mean Plasma Glucose: 120 mg/dL

## 2015-07-13 LAB — CBC
HEMATOCRIT: 34.4 % — AB (ref 36.0–46.0)
HEMOGLOBIN: 11.1 g/dL — AB (ref 12.0–15.0)
MCH: 31.9 pg (ref 26.0–34.0)
MCHC: 32.3 g/dL (ref 30.0–36.0)
MCV: 98.9 fL (ref 78.0–100.0)
Platelets: 178 10*3/uL (ref 150–400)
RBC: 3.48 MIL/uL — AB (ref 3.87–5.11)
RDW: 14.3 % (ref 11.5–15.5)
WBC: 12.7 10*3/uL — ABNORMAL HIGH (ref 4.0–10.5)

## 2015-07-13 LAB — GLUCOSE, CAPILLARY: Glucose-Capillary: 99 mg/dL (ref 65–99)

## 2015-07-13 LAB — HEPARIN LEVEL (UNFRACTIONATED): HEPARIN UNFRACTIONATED: 0.22 [IU]/mL — AB (ref 0.30–0.70)

## 2015-07-13 LAB — POCT ACTIVATED CLOTTING TIME: ACTIVATED CLOTTING TIME: 121 s

## 2015-07-13 SURGERY — LEFT HEART CATH AND CORS/GRAFTS ANGIOGRAPHY
Anesthesia: LOCAL

## 2015-07-13 MED ORDER — HEPARIN (PORCINE) IN NACL 100-0.45 UNIT/ML-% IJ SOLN
950.0000 [IU]/h | INTRAMUSCULAR | Status: DC
  Administered 2015-07-14: 950 [IU]/h via INTRAVENOUS
  Administered 2015-07-14: 900 [IU]/h via INTRAVENOUS
  Filled 2015-07-13: qty 250

## 2015-07-13 MED ORDER — MIDAZOLAM HCL 2 MG/2ML IJ SOLN
INTRAMUSCULAR | Status: AC
  Filled 2015-07-13: qty 2

## 2015-07-13 MED ORDER — WARFARIN SODIUM 6 MG PO TABS
6.0000 mg | ORAL_TABLET | Freq: Once | ORAL | Status: AC
  Administered 2015-07-13: 6 mg via ORAL
  Filled 2015-07-13: qty 1

## 2015-07-13 MED ORDER — MIDAZOLAM HCL 2 MG/2ML IJ SOLN
INTRAMUSCULAR | Status: DC | PRN
  Administered 2015-07-13: 1 mg via INTRAVENOUS

## 2015-07-13 MED ORDER — WARFARIN - PHARMACIST DOSING INPATIENT: Freq: Every day | Status: DC

## 2015-07-13 MED ORDER — SODIUM CHLORIDE 0.9% FLUSH
3.0000 mL | Freq: Two times a day (BID) | INTRAVENOUS | Status: DC
  Administered 2015-07-13 – 2015-07-19 (×8): 3 mL via INTRAVENOUS

## 2015-07-13 MED ORDER — ZOLPIDEM TARTRATE 5 MG PO TABS
5.0000 mg | ORAL_TABLET | Freq: Once | ORAL | Status: AC
  Administered 2015-07-13: 5 mg via ORAL
  Filled 2015-07-13: qty 1

## 2015-07-13 MED ORDER — SODIUM CHLORIDE 0.9 % IV SOLN: 250.0000 mL | INTRAVENOUS | Status: DC | PRN

## 2015-07-13 MED ORDER — SODIUM CHLORIDE 0.9% FLUSH
3.0000 mL | INTRAVENOUS | Status: DC | PRN
  Administered 2015-07-15 – 2015-07-19 (×4): 3 mL via INTRAVENOUS
  Filled 2015-07-13 (×4): qty 3

## 2015-07-13 MED ORDER — IOHEXOL 350 MG/ML SOLN
INTRAVENOUS | Status: DC | PRN
  Administered 2015-07-13: 125 mL via INTRA_ARTERIAL

## 2015-07-13 MED ORDER — HEPARIN (PORCINE) IN NACL 2-0.9 UNIT/ML-% IJ SOLN
INTRAMUSCULAR | Status: AC
  Filled 2015-07-13: qty 1000

## 2015-07-13 MED ORDER — LIDOCAINE HCL (PF) 1 % IJ SOLN
INTRAMUSCULAR | Status: DC | PRN
  Administered 2015-07-13: 14 mL via SUBCUTANEOUS

## 2015-07-13 MED ORDER — SODIUM CHLORIDE 0.9 % WEIGHT BASED INFUSION
1.0000 mL/kg/h | INTRAVENOUS | Status: DC
  Administered 2015-07-13: 1 mL/kg/h via INTRAVENOUS

## 2015-07-13 MED ORDER — LIDOCAINE HCL (PF) 1 % IJ SOLN
INTRAMUSCULAR | Status: AC
  Filled 2015-07-13: qty 30

## 2015-07-13 MED ORDER — HEPARIN (PORCINE) IN NACL 2-0.9 UNIT/ML-% IJ SOLN
INTRAMUSCULAR | Status: DC | PRN
  Administered 2015-07-13: 1000 mL via INTRA_ARTERIAL

## 2015-07-13 SURGICAL SUPPLY — 9 items
CATH INFINITI 5 FR IM (CATHETERS) ×1 IMPLANT
CATH INFINITI 5FR MULTPACK ANG (CATHETERS) ×1 IMPLANT
KIT HEART LEFT (KITS) ×2 IMPLANT
PACK CARDIAC CATHETERIZATION (CUSTOM PROCEDURE TRAY) ×2 IMPLANT
SET INTRODUCER MICROPUNCT 5F (INTRODUCER) ×1 IMPLANT
SHEATH PINNACLE 5F 10CM (SHEATH) ×1 IMPLANT
WIRE EMERALD 3MM-J .035X150CM (WIRE) ×1 IMPLANT
WIRE EMERALD 3MM-J .035X260CM (WIRE) ×1 IMPLANT
WIRE HI TORQ VERSACORE-J 145CM (WIRE) ×1 IMPLANT

## 2015-07-13 NOTE — Progress Notes (Signed)
[facility] Progress Note   Subjective:  No new complaints- anticipating heart cath today but time has been bumped according to family    Filed Vitals:   07/12/15 1626 07/12/15 2004 07/13/15 0448 07/13/15 0958  BP: 134/69 146/57 138/62 161/71  Pulse: 59 61 56 60  Temp: 97.6 F (36.4 C) 98 F (36.7 C) 98.3 F (36.8 C) 97.5 F (36.4 C)  TempSrc: Oral Oral Oral Oral  Resp: 18 19 18 18   Height:      Weight:    74.5 kg (164 lb 3.9 oz)  SpO2: 99% 100% 99% 97%   I/O last 3 completed shifts: In: 102 [I.V.:102] Out: 0  Total I/O In: -  Out: 325 [Urine:325]  Exam: General: Well developed, well nourished WF in no acute distress.  Neck: RIJ HD cath in place (2/16) Lungs: Coarse crackles R ant chest and few R base Heart:Regular, S1 & S2 normal, 2/6 apical murmur but no rubs, or gallop.  Abdomen: Soft, non-tender, non-distended with normoactive bowel sounds.  Extremities:  No significant edema.  Neuro: Alert and oriented X 3.    Inpatient medications: . amiodarone  400 mg Oral BID  . antiseptic oral rinse  7 mL Mouth Rinse BID  . feeding supplement (NEPRO CARB STEADY)  237 mL Oral Q24H  . folic acid  1 mg Oral Daily  . metoprolol  75 mg Oral BID  . multivitamin with minerals  1 tablet Oral Daily  . pantoprazole  40 mg Oral Daily  . sodium chloride flush  3 mL Intravenous Q12H  . sodium chloride flush  3 mL Intravenous Q12H  . thiamine  100 mg Oral Daily   Infusions . sodium chloride    . heparin 900 Units/hr (07/13/15 1320)   Prn medicines sodium chloride, sodium chloride, acetaminophen **OR** acetaminophen, diphenhydrAMINE, levalbuterol, ondansetron **OR** ondansetron (ZOFRAN) IV, sodium chloride flush   Recent Labs Lab 07/11/15 0500 07/12/15 0543 07/13/15 0730  NA 139 139 139  K 3.7 4.2 4.3  CL 97* 99* 100*  CO2 25 25 22   GLUCOSE 98 108* 96  BUN 104* 45* 73*  CREATININE 3.50* 2.61* 3.56*  CALCIUM 8.8* 8.6* 8.7*  PHOS 5.2* 4.1 5.0*     Recent Labs Lab 07/09/15 0420 07/10/15 0425 07/11/15 0500 07/12/15 0543 07/13/15 0730  AST 275* 131* 70*  --   --   ALT 666* 537* 382*  --   --   ALKPHOS 100 95 96  --   --   BILITOT 0.8 1.0 1.2  --   --   PROT 5.8* 5.7* 5.7*  --   --   ALBUMIN 3.3* 3.3* 3.3* 3.1* 3.1*    Recent Labs Lab 07/12/15 0543 07/12/15 1950 07/13/15 0730  WBC 12.5* 12.0* 12.7*  HGB 11.2* 11.3* 11.1*  HCT 35.5* 35.7* 34.4*  MCV 97.0 97.3 98.9  PLT 191 202 178   Background 80 y.o. year-old with hx of CAD/ CABG, PAD, HTN , HL and CKD. Followed by Dr. Briant Cedar for CKD with baseline creat -of 2, RAS (L RA stent and last duplex 2016 >60% right RAS)  She presented to ED with SOB, recurrent URI since December, wheezing with  2-3 prior rounds of abx w/o relief. Came to ED where RA SaO2 was 87%.Hospital eval demonstrated new drop in EF to 20-25%, new onset Afib, CT with bronchopneumonia. With diuresis creatinine progressive rose to around 4.6, BUN in the 170's. Cardiology felt hands tied in terms of their eval unless  pt agreeable to HD and after long discussion with pt and family was agreeable. Pt transferred to [facility] for further eval. R heart cath confirmed high filling pressures and supported cardiorenal component to AKI. HD was initiated 2/16 via temp cath placed that date with understanding that this could be permanent RF. L heart cath pending. Also in need of reassessment of her known RAS   Assessment: SOB/ cough/ hypoxemia - combination of PNA + combined systolic/diastolic CHF. Very poor LV function (EF 20-25%), and new afib. CT of chest - edema, small effusions, changes of bronchopneumonia. Bilateral tree bud opacities ?bronchoPNA with atypical organisms, less likely edema or micronodular metastatic disease. ATB's completed. Right heart cath confirmed high filling pressures, probable cardiorenal component to her renal failure and she is clinically improved with dialysis [place] net of 3 liters off in 2  treatments.   1. CKD4 (baseline creatinine around 2-2.5, known RAS, s/p L RA Stent 11/2013. Renal duplex 02/2015: >60% right proximal renal artery stenosis, normal left renal artery s/p stent), -> now CKD 5 (AKI on CKD4) - worsened renal fx with diuresis for CHF, very low EF, new AF - cardiorenal component. Bump in creat from baseline mid 2's. Decided with family on [place] trial of dialysis (and this has facilitated ability to do cardiac evaluation) and she has done very well with HD 2 treatments in. She understands that this could be permanent but that she will always have option of stopping. UOP has dropped off to very little. Diuretics stopped.  Last HD #2 was 2/18 (Saturday)- will plan for another tomorrow   2. Bilateral renal artery stenosis with left renal artery stent and last duplex suggestive of "60-99% reduction in diameter of RRA. Might be the only evaluation/possible intervention that could offer some salvage of renal function. Discussed with Dr. Johney Frame. We haven't decided on final plan (options look at during cath vs CO2 angio at separate sitting)   3. New AF - On amio,. Has spontaneously converted to SR. Still on IV heparin  4. CAD hx CABG with severe dilated / CM EF 20-25% -For left heart cath on Monday.  5. HTN meds  6. HL previously on alirocumab (Praluent) injections - Dr. Loreta Ave recommended stopping  7. ID - no fever. CT chest changes of bronchopneumonaia (and they wonder about atypicals). No ATB's now. New R chest crackles on exam today. Evaluate with CXR.   8. ^LFT's - etoh vs congestion. AST/ALT improving    9. Alcohol abuse    [name]   07/13/2015, 2:13 PM

## 2015-07-13 NOTE — Progress Notes (Signed)
Removal of Left arterial sheath without complication. Manula compression applied for 20 minutes. Left Groin stable, with no oozing, Bruising or hematoma present. Bp: 169/67, HR: 61, SP02: 97. patient given post removal instructions and understands. Tegaderm applied with sterile gauze to puncture site

## 2015-07-13 NOTE — Care Management Important Message (Signed)
Important Message  Patient Details  Name: VALTA AYDT MRN: [id number] Date of Birth: July 12, 1935   Medicare Important Message Given:  Yes    Bernadette Hoit 07/13/2015, 11:37 AM

## 2015-07-13 NOTE — Progress Notes (Signed)
Meridee Score, CN spoke with cath lab.  Per Mardelle Matte, OK to take meds with applesauce.

## 2015-07-13 NOTE — Progress Notes (Signed)
Physical Therapy Treatment Patient Details Name: NARY SNEED MRN: [id number] DOB: 1936-04-11 Today's Date: 07/13/2015    History of Present Illness 80 yo female admitted with acute on chronic renal failure, Afib. Hx of ETOH abuse    PT Comments    Pt progressing with mobility. Requiring Min Guard-Supervision for all OOB activity. Pt displayed increased stability today however remains reliant on RW for ambulation. C/o shakiness and increased fatigue in LEs today and educated pt on the importance of getting out of bed with staff during the day to increase strength. Continuing to recommend HHPT upon D/C.  Follow Up Recommendations  Home health PT;Supervision/Assistance - 24 hour     Equipment Recommendations  Rolling walker with 5" wheels    Recommendations for Other Services       Precautions / Restrictions Precautions Precautions: Fall Precaution Comments: monitor sats, HR Restrictions Weight Bearing Restrictions: No    Mobility  Bed Mobility Overal bed mobility: Needs Assistance Bed Mobility: Supine to Sit     Supine to sit: Supervision     General bed mobility comments: close guard for safety, increased time given, cueing to push up onto elbow  Transfers Overall transfer level: Needs assistance Equipment used: Rolling walker (2 wheeled) Transfers: Sit to/from Stand Sit to Stand: Min guard         General transfer comment: VCs for hand placement and safety   Ambulation/Gait Ambulation/Gait assistance: Min guard Ambulation Distance (Feet): 100 Feet Assistive device: Rolling walker (2 wheeled) Gait Pattern/deviations: Step-through pattern;Decreased stride length;Shuffle Gait velocity: decreased   General Gait Details: slow gait speed, c/o increased fatigue in LEs requiring close guard for stability, manual assist w/ RW to avoid obstacles in hallway   Stairs            Wheelchair Mobility    Modified Rankin (Stroke Patients Only)        Balance Overall balance assessment: Needs assistance Sitting-balance support: No upper extremity supported;Feet supported Sitting balance-Leahy Scale: Fair     Standing balance support: Bilateral upper extremity supported;During functional activity Standing balance-Leahy Scale: Fair                      Cognition Arousal/Alertness: Awake/alert (pt just woke up and needed time to become fully aware) Behavior During Therapy: Flat affect Overall Cognitive Status: Within Functional Limits for tasks assessed                      Exercises      General Comments        Pertinent Vitals/Pain Pain Assessment: No/denies pain    Home Living                      Prior Function            PT Goals (current goals can now be found in the care plan section) Acute Rehab PT Goals Patient Stated Goal: sleep more Progress towards PT goals: Progressing toward goals    Frequency  Min 3X/week    PT Plan Current plan remains appropriate    Co-evaluation             End of Session Equipment Utilized During Treatment: Gait belt Activity Tolerance: Patient limited by fatigue Patient left: in chair;with call bell/phone within reach;with family/visitor present     Time: 0820-0839 PT Time Calculation (min) (ACUTE ONLY): 19 min  Charges:  $Gait Training: 8-22 mins  G Codes:      Ulyses Jarred 07/13/2015, 11:51 AM  Ulyses Jarred, Student Physical Therapist Acute Rehab [phone]

## 2015-07-13 NOTE — Progress Notes (Signed)
ANTICOAGULATION CONSULT NOTE - Follow Up Consult  Pharmacy Consult for Heparin Indication: r/o ACS, atrial fibrillation  Allergies  Allergen Reactions  . Angiotensin Receptor Blockers   . Latex Swelling  . Statins Other (See Comments)    Diffuse cramping; has tried Lipitor, Crestor, Pravachol and simvastatin  . Benicar [Olmesartan] Itching and Rash    Patient Measurements: Height: 5\' 3"  (160 cm) Weight: 159 lb 13.3 oz (72.5 kg) IBW/kg (Calculated) : 52.4 Heparin Dosing Weight: 63kg  Vital Signs: Temp: 98.3 F (36.8 C) (02/20 0448) Temp Source: Oral (02/20 0448) BP: 138/62 mmHg (02/20 0448) Pulse Rate: 56 (02/20 0448)  Labs:  Recent Labs  07/11/15 0455  07/11/15 0500 07/12/15 0543 07/12/15 1950 07/13/15 0730  HGB  --   < > 11.4* 11.2* 11.3* 11.1*  HCT  --   < > 34.1* 35.5* 35.7* 34.4*  PLT  --   < > 201 191 202 178  HEPARINUNFRC 0.31  --   --  0.36  --  0.22*  CREATININE  --   --  3.50* 2.61*  --   --   < > = values in this interval not displayed.  Estimated Creatinine Clearance: 17.2 mL/min (by C-G formula based on Cr of 2.61).  Assessment: 79yof continued on heparin after RHC on 2/16 for possible ACS and new afib. [facility] for [facility]. Heparin level dropped slightly this morning- confirmed with RN it had NOT been turned off for cath yet. Hgb 11.1, Plts 178, no s/sx bleeding noted.   Goal of Therapy:  Heparin level 0.3-0.7 units/ml Monitor platelets by anticoagulation protocol: Yes   Plan:  - Increase heparin gtt to 900units/hr - Follow up after cath  - Daily HL, CBC - Monitor for s/sx of bleeding  Jerrick Farve D. Casy Tavano, PharmD, BCPS Clinical Pharmacist Pager: [phone] 07/13/2015 8:54 AM

## 2015-07-13 NOTE — Progress Notes (Signed)
[facility] TEAM 2  PROGRESS NOTE  [name] VOZ:[id number] DOB: 04-24-1936 DOA: 07/03/2015 PCP: Michiel Sites, MD  Admit HPI / Brief Narrative: 80 yo F Hx ETOH Abuse (5-6 glasses Liquor/day), coronary artery disease, peripheral vascular disease, and CKD III, who was in her usual state of health until sometime in December when she started developing a cough. She was prescribed an antibiotic regimen. Chest x-ray did not show a pneumonia. 4 days prior to her admit she went back to see her primary care physician c/o green sputum with cough. She had a fever up to 102F.   ED evaluation revealed elevated BNP and troponin. New-onset atrial fibrillation with rapid ventricular response. Lactic acid was also elevated. WBC was normal. She was noted to have acute on chronic renal failure.  Patient also noted to have a transaminitis.  HPI/Subjective: The patient has no new complaints   Assessment/Plan:  Hypothermia Resolved   Cough /shortness of breath / hypoxia  Influenza PCR negative - ?bronchopneumonia suggested by CT chest - currently off abx - follow clinically - no symptoms to suggest persisting infection presently. Will continue to monitor. No fevers  Acute renal failure in setting of CKD 4 - Now CKD 5 Nephrology following and directing HD - acute failure felt to be due to cardiorenal synd - baseline crt ~2.5  Bilateral renal artery stenosis left renal artery stent and last duplex suggestive of 60-99% reduction in diameter of RRA - plan is for evaluation of renal arteries with cardiac cath next week  Severe acute systolic CHF  EF 42-59% - volume control per HD - Cards following - no acute respiratory distress presently  CAD - CABG x4 2007 - Elevated troponin /NSTEMI cardiac cath pending  Transaminitis - Alcohol Abuse Drinks 5-6 glasses of liquor per day - currently outside of probable alcohol withdrawal window - holding  Alirocumab - viral hepatitis panel negative - LFTs  improving on last check  New onset Atrial fibrillation with RVR TSH normal - converted to NSR w/ addition of amiodarone and with volume removal per hemodialysis - IV heparin anticoag - Cards following   HTN stable - follow w/ ongoing HD - care w/ med titration to avoid post-HD hypotension   Overweight - Body mass index is 29.1 kg/(m^2).   PVD/PAD S/p fem/pop bypass R 1997 - RAS s/p L stent 2015  HLD  Hyperglycemia  Check A1c in AM  Code Status: FULL Family Communication: d/c family at bedside Disposition Plan: pending improvement in condition  Consultants: Nephrology Cardiology   Procedures: 2/12 TTE - EF 20-25%. Diffuse hypokinesis. Akinesis anteroseptal and inferolateral myocardium. - Aortic valve: There was mild regurgitation - Mitral valve: moderateregurgitation - Left atrium:severely dilated. 2/16 [facility] HD Cath 2/16 R heart cath - mod elevated filling pressures   Antibiotics: cefepime 02/11 > 02/12 vancomycin 2/10 > 02/12 Doxycycline 2/15 > 2/16  DVT prophylaxis: IV Heparin   Objective: Blood pressure 161/71, pulse 60, temperature 97.5 F (36.4 C), temperature source Oral, resp. rate 18, height 5\' 3"  (1.6 m), weight 74.5 kg (164 lb 3.9 oz), SpO2 97 %.  Intake/Output Summary (Last 24 hours) at 07/13/15 1603 Last data filed at 07/13/15 1400  Gross per 24 hour  Intake 104.57 ml  Output    325 ml  Net -220.43 ml   Family requested I did not wake outpatient and allow her to sleep as such physical exam was limited to what was observed  Exam: General: No acute respiratory distress - resting comfortably  Lungs: equal chest rise Cardiovascular: no cyanosis Abdomen: no guarding Extremities: No significant cyanosis, or clubbing  Data Reviewed:  Basic Metabolic Panel:  Recent Labs Lab 07/09/15 0420 07/10/15 0425 07/11/15 0500 07/12/15 0543 07/13/15 0730  NA 132* 139 139 139 139  K 3.6 3.4* 3.7 4.2 4.3  CL 91* 96* 97* 99* 100*  CO2 20* 27 25 25 22     GLUCOSE 189* 124* 98 108* 96  BUN 177* 90* 104* 45* 73*  CREATININE 4.61* 2.99* 3.50* 2.61* 3.56*  CALCIUM 7.9* 8.2* 8.8* 8.6* 8.7*  MG 2.6* 2.2 2.1  --   --   PHOS 8.7* 5.7* 5.2* 4.1 5.0*    CBC:  Recent Labs Lab 07/10/15 0425 07/11/15 0500 07/12/15 0543 07/12/15 1950 07/13/15 0730  WBC 10.8* 12.0* 12.5* 12.0* 12.7*  HGB 11.1* 11.4* 11.2* 11.3* 11.1*  HCT 32.4* 34.1* 35.5* 35.7* 34.4*  MCV 93.4 95.8 97.0 97.3 98.9  PLT 184 201 191 202 178    Liver Function Tests:  Recent Labs Lab 07/07/15 0547 07/08/15 0256 07/09/15 0420 07/10/15 0425 07/11/15 0500 07/12/15 0543 07/13/15 0730  AST 612* 502* 275* 131* 70*  --   --   ALT 815* 778* 666* 537* 382*  --   --   ALKPHOS 126 113 100 95 96  --   --   BILITOT 0.8 0.8 0.8 1.0 1.2  --   --   PROT 6.3* 6.1* 5.8* 5.7* 5.7*  --   --   ALBUMIN 3.7 3.6 3.3* 3.3* 3.3* 3.1* 3.1*   Coags:  Recent Labs Lab 07/09/15 0420  INR 1.34     Recent Results (from the past 240 hour(s))  Culture, blood (Routine X 2) w Reflex to ID Panel     Status: None   Collection Time: 07/07/15  8:38 AM  Result Value Ref Range Status   Specimen Description BLOOD RIGHT ARM  Final   Special Requests BOTTLES DRAWN AEROBIC AND ANAEROBIC 5CC  Final   Culture   Final    NO GROWTH 5 DAYS Performed at [facility]    Report Status 07/12/2015 FINAL  Final  Culture, blood (Routine X 2) w Reflex to ID Panel     Status: None   Collection Time: 07/07/15  8:47 AM  Result Value Ref Range Status   Specimen Description BLOOD RIGHT HAND  Final   Special Requests IN PEDIATRIC BOTTLE 3CC  Final   Culture   Final    NO GROWTH 5 DAYS Performed at [facility]    Report Status 07/12/2015 FINAL  Final  MRSA PCR Screening     Status: None   Collection Time: 07/08/15  7:54 AM  Result Value Ref Range Status   MRSA by PCR NEGATIVE NEGATIVE Final    Comment:        The GeneXpert MRSA Assay (FDA approved for NASAL specimens only), is one component  of a comprehensive MRSA colonization surveillance program. It is not intended to diagnose MRSA infection nor to guide or monitor treatment for MRSA infections.       Scheduled Meds:  Scheduled Meds: . [MAR Hold] amiodarone  400 mg Oral BID  . [MAR Hold] antiseptic oral rinse  7 mL Mouth Rinse BID  . [MAR Hold] feeding supplement (NEPRO CARB STEADY)  237 mL Oral Q24H  . [MAR Hold] folic acid  1 mg Oral Daily  . [MAR Hold] metoprolol  75 mg Oral BID  . [MAR Hold] multivitamin with minerals  1 tablet Oral Daily  . [MAR Hold] pantoprazole  40 mg Oral Daily  . [MAR Hold] sodium chloride flush  3 mL Intravenous Q12H  . sodium chloride flush  3 mL Intravenous Q12H  . [MAR Hold] thiamine  100 mg Oral Daily    Time spent on care of this patient: 35 mins   Penny Pia , MD   [facility] Office  [phone] Pager - Text Page per Loretha Stapler as per below:  On-Call/Text Page:      [email or web address]      password [email or web address]  If 7PM-7AM, please contact night-coverage [email or web address] Password [email or web address] 07/13/2015, 4:03 PM   LOS: 10 days

## 2015-07-13 NOTE — H&P (View-Only) (Signed)
SUBJECTIVE: The patient is doing well today.  At this time, she denies chest pain, shortness of breath, or any new concerns.   She was up ambulating with PT when I came to see her  . amiodarone  400 mg Oral BID  . antiseptic oral rinse  7 mL Mouth Rinse BID  . feeding supplement (NEPRO CARB STEADY)  237 mL Oral Q24H  . folic acid  1 mg Oral Daily  . furosemide  80 mg Intravenous Q12H  . metoprolol  75 mg Oral BID  . multivitamin with minerals  1 tablet Oral Daily  . pantoprazole  40 mg Oral Daily  . sodium chloride flush  3 mL Intravenous Q12H  . sodium chloride flush  3 mL Intravenous Q12H  . thiamine  100 mg Oral Daily   . sodium chloride    . heparin 850 Units/hr (07/12/15 0753)    OBJECTIVE: Physical Exam: Filed Vitals:   07/12/15 0900 07/12/15 1626 07/12/15 2004 07/13/15 0448  BP: 152/70 134/69 146/57 138/62  Pulse: 66 59 61 56  Temp: 97.9 F (36.6 C) 97.6 F (36.4 C) 98 F (36.7 C) 98.3 F (36.8 C)  TempSrc: Oral Oral Oral Oral  Resp: 18 18 19 18   Height:      Weight:      SpO2: 100% 99% 100% 99%    Intake/Output Summary (Last 24 hours) at 07/13/15 2919 Last data filed at 07/13/15 1660  Gross per 24 hour  Intake    102 ml  Output      0 ml  Net    102 ml    Telemetry reveals afib converted to sinus rhythm overnight  GEN- The patient is well appearing, alert and oriented x 3 today.   Head- normocephalic, atraumatic Eyes-  Sclera clear, conjunctiva pink Ears- hearing intact Oropharynx- clear Neck- supple, CVL catheter in place Lungs- bibasilar rales, normal work of breathing Heart- Regular rate and rhythm  GI- soft, NT, ND, + BS Extremities- no clubbing, cyanosis, +edema Skin- no rash or lesion Psych- euthymic mood, full affect Neuro- strength and sensation are intact  LABS: Basic Metabolic Panel:  Recent Labs  60/04/59 0500 07/12/15 0543  NA 139 139  K 3.7 4.2  CL 97* 99*  CO2 25 25  GLUCOSE 98 108*  BUN 104* 45*  CREATININE 3.50*  2.61*  CALCIUM 8.8* 8.6*  MG 2.1  --   PHOS 5.2* 4.1   Liver Function Tests:  Recent Labs  07/11/15 0500 07/12/15 0543  AST 70*  --   ALT 382*  --   ALKPHOS 96  --   BILITOT 1.2  --   PROT 5.7*  --   ALBUMIN 3.3* 3.1*   No results for input(s): LIPASE, AMYLASE in the last 72 hours. CBC:  Recent Labs  07/12/15 0543 07/12/15 1950  WBC 12.5* 12.0*  HGB 11.2* 11.3*  HCT 35.5* 35.7*  MCV 97.0 97.3  PLT 191 202    ASSESSMENT AND PLAN:    1. SOB/ cough/ hypoxemia - multifactoral (advanced and decompensated renal failure, acute on chronic combined systolic/diastolic CHF, afib) clinically improved with dialysis.   Continue to monitor.    2. Acute on chronic renal failure (now CKD 5-->AKI on CKD4).  Per Dr Eliott Nine, there is concerned about R renal artery stenosis.  May benefit from renal angiogram at time of cath next week.  Tolerating dialysis very well and much improved clinically.      3. Persistent atrial fibrillation- she  has converted to sinus rhythm with fluid removal.  Suggests afib may be secondary to volume issues.  On heparin drip.  Would start coumadin post cath.  Continue on amiodarone.  Given renal failure, would not advise NOAC therapy.  4.  CAD hx CABG with severe dilated / CM EF 20-25% -For left heart cath  Today  per Dr Harding.  Off of ace inhibitor due to renal failure/ RAS.  Previously intolerant to statins.    5. HTN- stable, on  coreg    Nahser, Philip J, MD  07/13/2015 8:34 AM    [facility] [address],  [address] [place], [place]  [address] Pager [phone] Phone: [phone]; Fax: [phone]          

## 2015-07-13 NOTE — Progress Notes (Signed)
ANTICOAGULATION CONSULT NOTE - Follow Up Consult  Pharmacy Consult for heparin + Coumadin Indication: atrial fibrillation  Allergies  Allergen Reactions  . Angiotensin Receptor Blockers   . Latex Swelling  . Statins Other (See Comments)    Diffuse cramping; has tried Lipitor, Crestor, Pravachol and simvastatin  . Benicar [Olmesartan] Itching and Rash    Patient Measurements: Height: 5\' 3"  (160 cm) Weight: 164 lb 3.9 oz (74.5 kg) IBW/kg (Calculated) : 52.4 Heparin Dosing Weight: 63 kg  Vital Signs: Temp: 97.5 F (36.4 C) (02/20 0958) Temp Source: Oral (02/20 0958) BP: 175/74 mmHg (02/20 1720) Pulse Rate: 59 (02/20 1720)  Labs:  Recent Labs  07/11/15 0455  07/11/15 0500 07/12/15 0543 07/12/15 1950 07/13/15 0730  HGB  --   < > 11.4* 11.2* 11.3* 11.1*  HCT  --   < > 34.1* 35.5* 35.7* 34.4*  PLT  --   < > 201 191 202 178  HEPARINUNFRC 0.31  --   --  0.36  --  0.22*  CREATININE  --   --  3.50* 2.61*  --  3.56*  < > = values in this interval not displayed.  Estimated Creatinine Clearance: 12.8 mL/min (by C-G formula based on Cr of 3.56).   Assessment:  Anticoag: hep Rx for r/o ACS and new onset afib, S/p RHC 2/16 - heparin resumed after.  Resume heparin and coumadin post-cath 2/20. CHADS2VASC: 5. Watch for Coumadin/Amiodarone drug interaction with INR.  Goal of Therapy:  Heparin level 0.3-0.7 units/ml Monitor platelets by anticoagulation protocol: Yes   Plan:  - L arterial sheath removed 1730. Start IV heparin 0130 at 900 units/hr - Coumadin 6mg  po x 1 tonight, Daily INR   Kham Zuckerman S. Merilynn Finland, PharmD, BCPS Clinical Staff Pharmacist Pager [phone]  Misty Stanley Stillinger 07/13/2015,5:49 PM

## 2015-07-13 NOTE — Progress Notes (Signed)
SUBJECTIVE: The patient is doing well today.  At this time, she denies chest pain, shortness of breath, or any new concerns.   She was up ambulating with PT when I came to see her  . amiodarone  400 mg Oral BID  . antiseptic oral rinse  7 mL Mouth Rinse BID  . feeding supplement (NEPRO CARB STEADY)  237 mL Oral Q24H  . folic acid  1 mg Oral Daily  . furosemide  80 mg Intravenous Q12H  . metoprolol  75 mg Oral BID  . multivitamin with minerals  1 tablet Oral Daily  . pantoprazole  40 mg Oral Daily  . sodium chloride flush  3 mL Intravenous Q12H  . sodium chloride flush  3 mL Intravenous Q12H  . thiamine  100 mg Oral Daily   . sodium chloride    . heparin 850 Units/hr (07/12/15 0753)    OBJECTIVE: Physical Exam: Filed Vitals:   07/12/15 0900 07/12/15 1626 07/12/15 2004 07/13/15 0448  BP: 152/70 134/69 146/57 138/62  Pulse: 66 59 61 56  Temp: 97.9 F (36.6 C) 97.6 F (36.4 C) 98 F (36.7 C) 98.3 F (36.8 C)  TempSrc: Oral Oral Oral Oral  Resp: 18 18 19 18   Height:      Weight:      SpO2: 100% 99% 100% 99%    Intake/Output Summary (Last 24 hours) at 07/13/15 2919 Last data filed at 07/13/15 1660  Gross per 24 hour  Intake    102 ml  Output      0 ml  Net    102 ml    Telemetry reveals afib converted to sinus rhythm overnight  GEN- The patient is well appearing, alert and oriented x 3 today.   Head- normocephalic, atraumatic Eyes-  Sclera clear, conjunctiva pink Ears- hearing intact Oropharynx- clear Neck- supple, CVL catheter in place Lungs- bibasilar rales, normal work of breathing Heart- Regular rate and rhythm  GI- soft, NT, ND, + BS Extremities- no clubbing, cyanosis, +edema Skin- no rash or lesion Psych- euthymic mood, full affect Neuro- strength and sensation are intact  LABS: Basic Metabolic Panel:  Recent Labs  60/04/59 0500 07/12/15 0543  NA 139 139  K 3.7 4.2  CL 97* 99*  CO2 25 25  GLUCOSE 98 108*  BUN 104* 45*  CREATININE 3.50*  2.61*  CALCIUM 8.8* 8.6*  MG 2.1  --   PHOS 5.2* 4.1   Liver Function Tests:  Recent Labs  07/11/15 0500 07/12/15 0543  AST 70*  --   ALT 382*  --   ALKPHOS 96  --   BILITOT 1.2  --   PROT 5.7*  --   ALBUMIN 3.3* 3.1*   No results for input(s): LIPASE, AMYLASE in the last 72 hours. CBC:  Recent Labs  07/12/15 0543 07/12/15 1950  WBC 12.5* 12.0*  HGB 11.2* 11.3*  HCT 35.5* 35.7*  MCV 97.0 97.3  PLT 191 202    ASSESSMENT AND PLAN:    1. SOB/ cough/ hypoxemia - multifactoral (advanced and decompensated renal failure, acute on chronic combined systolic/diastolic CHF, afib) clinically improved with dialysis.   Continue to monitor.    2. Acute on chronic renal failure (now CKD 5-->AKI on CKD4).  Per Dr Eliott Nine, there is concerned about R renal artery stenosis.  May benefit from renal angiogram at time of cath next week.  Tolerating dialysis very well and much improved clinically.      3. Persistent atrial fibrillation- she  has converted to sinus rhythm with fluid removal.  Suggests afib may be secondary to volume issues.  On heparin drip.  Would start coumadin post cath.  Continue on amiodarone.  Given renal failure, would not advise NOAC therapy.  4.  CAD hx CABG with severe dilated / CM EF 20-25% -For left heart cath  Today  per Dr Herbie Baltimore.  Off of ace inhibitor due to renal failure/ RAS.  Previously intolerant to statins.    5. HTN- stable, on  coreg    Abubakar Crispo, Deloris Ping, MD  07/13/2015 8:34 AM    [facility] [address] [place],  [address] [place], [place]  [address] Pager [phone] Phone: [phone]; Fax: [phone]

## 2015-07-13 NOTE — Interval H&P Note (Signed)
History and Physical Interval Note:  07/13/2015 3:56 PM  [name]  has presented today for surgery, with the diagnosis of new onset cardiomyopathy/resume ischemic  The various methods of treatment have been discussed with the patient and family. After consideration of risks, benefits and other options for treatment, the patient has consented to  Procedure(s): Left Heart Cath and Cors/Grafts Angiography (N/A) Renal Angiography (N/A) as a surgical intervention .  The patient's history has been reviewed, patient examined, no change in status, stable for surgery.  I have reviewed the patient's chart and labs.  Questions were answered to the patient's satisfaction.     HARDING, DAVID W

## 2015-07-14 ENCOUNTER — Encounter (HOSPITAL_COMMUNITY): Payer: Self-pay | Admitting: Cardiology

## 2015-07-14 DIAGNOSIS — I5042 Chronic combined systolic (congestive) and diastolic (congestive) heart failure: Secondary | ICD-10-CM

## 2015-07-14 LAB — PROTIME-INR
INR: 1.46 (ref 0.00–1.49)
PROTHROMBIN TIME: 17.8 s — AB (ref 11.6–15.2)

## 2015-07-14 LAB — CBC
HCT: 35.3 % — ABNORMAL LOW (ref 36.0–46.0)
HCT: 33.3 % — ABNORMAL LOW (ref 36.0–46.0)
HEMOGLOBIN: 10.9 g/dL — AB (ref 12.0–15.0)
Hemoglobin: 10.3 g/dL — ABNORMAL LOW (ref 12.0–15.0)
MCH: 30.6 pg (ref 26.0–34.0)
MCH: 30.4 pg (ref 26.0–34.0)
MCHC: 30.9 g/dL (ref 30.0–36.0)
MCHC: 30.9 g/dL (ref 30.0–36.0)
MCV: 99.2 fL (ref 78.0–100.0)
MCV: 98.2 fL (ref 78.0–100.0)
PLATELETS: 208 10*3/uL (ref 150–400)
PLATELETS: 202 10*3/uL (ref 150–400)
RBC: 3.56 MIL/uL — AB (ref 3.87–5.11)
RBC: 3.39 MIL/uL — ABNORMAL LOW (ref 3.87–5.11)
RDW: 14.2 % (ref 11.5–15.5)
RDW: 14.3 % (ref 11.5–15.5)
WBC: 12 10*3/uL — AB (ref 4.0–10.5)
WBC: 12.3 10*3/uL — AB (ref 4.0–10.5)

## 2015-07-14 LAB — RENAL FUNCTION PANEL
ALBUMIN: 3 g/dL — AB (ref 3.5–5.0)
Anion gap: 15 (ref 5–15)
BUN: 86 mg/dL — AB (ref 6–20)
CO2: 24 mmol/L (ref 22–32)
CREATININE: 4.23 mg/dL — AB (ref 0.44–1.00)
Calcium: 8.8 mg/dL — ABNORMAL LOW (ref 8.9–10.3)
Chloride: 97 mmol/L — ABNORMAL LOW (ref 101–111)
GFR calc Af Amer: 11 mL/min — ABNORMAL LOW (ref >60)
GFR, EST NON AFRICAN AMERICAN: 9 mL/min — AB (ref >60)
Glucose, Bld: 109 mg/dL — ABNORMAL HIGH (ref 65–99)
PHOSPHORUS: 5.9 mg/dL — AB (ref 2.5–4.6)
Potassium: 4.2 mmol/L (ref 3.5–5.1)
Sodium: 136 mmol/L (ref 135–145)

## 2015-07-14 LAB — HEPARIN LEVEL (UNFRACTIONATED)
HEPARIN UNFRACTIONATED: 0.31 [IU]/mL (ref 0.30–0.70)
Heparin Unfractionated: 0.45 IU/mL (ref 0.30–0.70)

## 2015-07-14 MED ORDER — WARFARIN SODIUM 6 MG PO TABS
6.0000 mg | ORAL_TABLET | Freq: Once | ORAL | Status: AC
  Administered 2015-07-14: 6 mg via ORAL
  Filled 2015-07-14: qty 1

## 2015-07-14 MED ORDER — NEPRO/CARBSTEADY PO LIQD
237.0000 mL | Freq: Two times a day (BID) | ORAL | Status: DC
Start: 2015-07-15 — End: 2015-07-19
  Administered 2015-07-15 – 2015-07-19 (×4): 237 mL via ORAL

## 2015-07-14 MED ORDER — ACETAMINOPHEN 325 MG PO TABS
ORAL_TABLET | ORAL | Status: AC
  Filled 2015-07-14: qty 2

## 2015-07-14 MED ORDER — DIPHENHYDRAMINE HCL 25 MG PO CAPS
ORAL_CAPSULE | ORAL | Status: AC
Start: 2015-07-14 — End: 2015-07-14
  Filled 2015-07-14: qty 1

## 2015-07-14 NOTE — Progress Notes (Signed)
[facility] Progress Note   Subjective:  No new complaints- seen on HD- cath yesterday- no obvious culprit lesion and no intervention was required on renal arteries - 775 of UOP recorded    Filed Vitals:   07/14/15 0901 07/14/15 0930 07/14/15 1000 07/14/15 1030  BP: 154/68 133/58 150/75 151/66  Pulse: 64 92 63 64  Temp:      TempSrc:      Resp:      Height:      Weight:      SpO2:       I/O last 3 completed shifts: In: 190.6 [P.O.:120; I.V.:70.6] Out: 775 [Urine:775]    Exam: General: Well developed, well nourished WF in no acute distress.  Neck: RIJ HD cath in place (2/16) Lungs: Coarse crackles R ant chest and few R base Heart:Regular, S1 & S2 normal, 2/6 apical murmur but no rubs, or gallop.  Abdomen: Soft, non-tender, non-distended with normoactive bowel sounds.  Extremities:  No significant edema.  Neuro: Alert and oriented X 3.    Inpatient medications: . amiodarone  400 mg Oral BID  . antiseptic oral rinse  7 mL Mouth Rinse BID  . diphenhydrAMINE      . feeding supplement (NEPRO CARB STEADY)  237 mL Oral Q24H  . folic acid  1 mg Oral Daily  . metoprolol  75 mg Oral BID  . multivitamin with minerals  1 tablet Oral Daily  . pantoprazole  40 mg Oral Daily  . sodium chloride flush  3 mL Intravenous Q12H  . sodium chloride flush  3 mL Intravenous Q12H  . thiamine  100 mg Oral Daily  . Warfarin - Pharmacist Dosing Inpatient   Does not apply q1800   Infusions . heparin 900 Units/hr (07/14/15 0313)   Prn medicines sodium chloride, sodium chloride, acetaminophen **OR** acetaminophen, diphenhydrAMINE, levalbuterol, ondansetron **OR** ondansetron (ZOFRAN) IV, sodium chloride flush   Recent Labs Lab 07/12/15 0543 07/13/15 0730 07/14/15 0706  NA 139 139 136  K 4.2 4.3 4.2  CL 99* 100* 97*  CO2 GLUCOSE 108* 96 109*  BUN 45* 73* 86*  CREATININE 2.61* 3.56* 4.23*  CALCIUM 8.6* 8.7* 8.8*  PHOS 4.1 5.0* 5.9*    Recent Labs Lab  07/09/15 0420 07/10/15 0425 07/11/15 0500 07/12/15 0543 07/13/15 0730 07/14/15 0706  AST 275* 131* 70*  --   --   --   ALT 666* 537* 382*  --   --   --   ALKPHOS 100 95 96  --   --   --   BILITOT 0.8 1.0 1.2  --   --   --   PROT 5.8* 5.7* 5.7*  --   --   --   ALBUMIN 3.3* 3.3* 3.3* 3.1* 3.1* 3.0*    Recent Labs Lab 07/13/15 0730 07/14/15 0622 07/14/15 0706  WBC 12.7* 12.0* 12.3*  HGB 11.1* 10.9* 10.3*  HCT 34.4* 35.3* 33.3*  MCV 98.9 99.2 98.2  PLT 178 208 202   Background 80 y.o. year-old with hx of CAD/ CABG, PAD, HTN , HL and CKD. Followed by Dr. Briant Cedar for CKD with baseline creat -of 2, RAS (L RA stent and last duplex 2016 >60% right RAS)  She presented to ED with SOB, recurrent URI since December. Came to ED where RA SaO2 was 87%.Hospital eval demonstrated new drop in EF to 20-25%, new onset Afib, CT with bronchopneumonia. With diuresis creatinine progressive rose to around 4.6, BUN in the 170's.  Cardiology felt hands tied in terms of their eval unless pt agreeable to HD and after long discussion with pt and family was agreeable.  R heart cath confirmed high filling pressures and supported cardiorenal component to AKI. HD was initiated 2/16 via temp cath placed that date with understanding that this could be permanent RF.   Assessment: SOB/ cough/ hypoxemia - combination of PNA + combined systolic/diastolic CHF. Very poor LV function (EF 20-25%), and new afib. CT of chest - edema, small effusions, changes of bronchopneumonia. Bilateral  ?bronchoPNA with atypical organisms, less likely edema or micronodular metastatic disease. ATB's completed. Right heart cath confirmed high filling pressures, probable cardiorenal component to her renal failure and she is clinically improved with dialysis a net of 3 liters off in 2 treatments- more today.   CKD4 (baseline creatinine around 2-2.5, known RAS, s/p L RA Stent 11/2013. Renal duplex 02/2015: >60% right proximal renal artery  stenosis, normal left renal artery s/p stent), -> now CKD 5 (AKI on CKD4) - worsened renal fx with diuresis for CHF, very low EF, new AF - cardiorenal component. Bump in creat from baseline mid 2's. Decided with family on a trial of dialysis (and this has facilitated ability to do cardiac evaluation) and she has done very well with HD 2 treatments in- third today. She understands that this could be permanent but that she will always have option of stopping. UOP 700 last 24 hours. Diuretics stopped.  HD #2 was 2/18 (Saturday)-crt 2.6 on 2/19--> 3.6-->4.3 so another hd today- pt not sure is continuing on HD is what she wants to do - will discuss with tte family   Bilateral renal artery stenosis with left renal artery stent and last duplex suggestive of "60-99% reduction in diameter of RRA. xath not suggestive of anything requiring intervention in the renal arteries  1. New AF - On amio,. Has spontaneously converted to SR. Still on IV heparin- also started coumadin  2. CAD hx CABG with severe dilated / CM EF 20-25% -cath without culprit lesion- medical management  3. HTN meds- metoprolol  4. HL previously on alirocumab (Praluent) injections - Dr. Loreta Ave recommended stopping  5. ID - no fever. CT chest changes of bronchopneumonaia (and they wonder about atypicals). No ATB's now. New R chest crackles on exam today. Evaluate with CXR.   6. ^LFT's - etoh vs congestion. AST/ALT improving    7. Alcohol abuse    Willow Reczek A   07/14/2015, 10:40 AM

## 2015-07-14 NOTE — Progress Notes (Signed)
SUBJECTIVE:  80 y.o. female with a past medical history of coronary artery disease, peripheral vascular disease, chronic kidney disease stage III, who was in her usual state of health until sometime in December when she started developing a cough which was dry.  Echo revealed chronic systolic CHF with EF of 20-25% Cath on 2/20 shows patent grafts with no culprit lesions. Renal artery angiograms show no changes from previous study   Was examined in HD today  Says she is tired of feeling poorly     . amiodarone  400 mg Oral BID  . antiseptic oral rinse  7 mL Mouth Rinse BID  . diphenhydrAMINE      . feeding supplement (NEPRO CARB STEADY)  237 mL Oral Q24H  . folic acid  1 mg Oral Daily  . metoprolol  75 mg Oral BID  . multivitamin with minerals  1 tablet Oral Daily  . pantoprazole  40 mg Oral Daily  . sodium chloride flush  3 mL Intravenous Q12H  . sodium chloride flush  3 mL Intravenous Q12H  . thiamine  100 mg Oral Daily  . Warfarin - Pharmacist Dosing Inpatient   Does not apply q1800   . heparin 900 Units/hr (07/14/15 0313)    OBJECTIVE: Physical Exam: Filed Vitals:   07/14/15 0802 07/14/15 0830 07/14/15 0901 07/14/15 0930  BP: 157/88 155/84 154/68 133/58  Pulse: 53 63 64 92  Temp:      TempSrc:      Resp:      Height:      Weight:      SpO2:        Intake/Output Summary (Last 24 hours) at 07/14/15 0944 Last data filed at 07/14/15 0137  Gross per 24 hour  Intake 173.57 ml  Output    450 ml  Net -276.43 ml     GEN- The patient is well appearing, alert and oriented x 3 today.   Head- normocephalic, atraumatic Eyes-  Sclera clear, conjunctiva pink Ears- hearing intact Oropharynx- clear Neck- supple, CVL catheter in place Lungs- bibasilar rales, normal work of breathing Heart- Regular rate and rhythm  GI- soft, NT, ND, + BS Extremities- no clubbing, cyanosis, +edema Skin- no rash or lesion Psych- euthymic mood, full affect Neuro- strength and sensation  are intact  LABS: Basic Metabolic Panel:  Recent Labs  16/10/96 0730 07/14/15 0706  NA 139 136  K 4.3 4.2  CL 100* 97*  CO2 22 24  GLUCOSE 96 109*  BUN 73* 86*  CREATININE 3.56* 4.23*  CALCIUM 8.7* 8.8*  PHOS 5.0* 5.9*   Liver Function Tests:  Recent Labs  07/13/15 0730 07/14/15 0706  ALBUMIN 3.1* 3.0*   No results for input(s): LIPASE, AMYLASE in the last 72 hours. CBC:  Recent Labs  07/14/15 0622 07/14/15 0706  WBC 12.0* 12.3*  HGB 10.9* 10.3*  HCT 35.3* 33.3*  MCV 99.2 98.2  PLT 208 202    ASSESSMENT AND PLAN:    1. SOB/ cough/ hypoxemia - multifactoral (advanced and decompensated renal failure, acute on chronic combined systolic/diastolic CHF, afib) clinically improved with dialysis.   Continue to monitor.    2. Acute on chronic renal failure (now CKD 5-->AKI on CKD4).  Renal arteries appear to be stable  .  Tolerating dialysis very well and much improved clinically.      3. Paroxysmal  atrial fibrillation- she has converted to sinus rhythm with fluid removal.    Heparin drip.   Starting  coumadin  4.  CAD hx CABG with severe dilated / CM EF 20-25% .    5. HTN-  on  coreg she may benefit from hydralazine.  Will start 10 mg  TID     Pasco Marchitto, Deloris Ping, MD  07/14/2015 9:44 AM    [facility] [address] [place],  [address] [place], [place]  [address] Pager [phone] Phone: [phone]; Fax: [phone]

## 2015-07-14 NOTE — Procedures (Signed)
Patient was seen on dialysis and the procedure was supervised.  BFR 400  Via vascath BP is  150/88.   Patient appears to be tolerating treatment well  [name] A 07/14/2015

## 2015-07-14 NOTE — Progress Notes (Signed)
Nutrition Follow-up  DOCUMENTATION CODES:   Obesity unspecified  INTERVENTION:  Once diet advances,  Provide Nepro Shake po BID, each supplement provides 425 kcal and 19 grams protein.  Provide nourishment snacks.   NUTRITION DIAGNOSIS:   Increased nutrient needs related to chronic illness as evidenced by estimated needs; ongoing  GOAL:   Patient will meet greater than or equal to 90% of their needs; not met  MONITOR:   PO intake, Labs, Weight trends, I & O's  REASON FOR ASSESSMENT:   Malnutrition Screening Tool    ASSESSMENT:   80 y.o. female with a past medical history of coronary artery disease, peripheral vascular disease, chronic kidney disease stage III, who was in her usual state of health until sometime in December when she started developing a cough which was dry. She had difficulty breathing. She was seen by her provider who prescribed an antibiotic regimen.  Pt now with CKD 5. HD was initiated 2/16 via temp cath.  Procedure (2/20): Left Heart Cath and Cors/Grafts Angiography Renal Angiography  Pt reports hunger during time of visit. No new diet has been ordered. Pt underwent HD today. Pt has Nepro Shake ordered and has been consuming prior to NPO. RD to modify orders to BID to aid in caloric and protein needs once diet advances.   Phosphorous elevated at 5.9.   Diet Order:    NPO  Skin:  Reviewed, no issues  Last BM:  2/20  Height:   Ht Readings from Last 1 Encounters:  07/08/15 '5\' 3"'$  (1.6 m)    Weight:   Wt Readings from Last 1 Encounters:  07/14/15 167 lb 12.3 oz (76.1 kg)    Ideal Body Weight:  52.3 kg  BMI:  Body mass index is 29.73 kg/(m^2).  Estimated Nutritional Needs:   Kcal:  7824-2353  Protein:  85-95 grams  Fluid:  per MD  EDUCATION NEEDS:   No education needs identified at this time  Corrin Parker, MS, RD, LDN Pager # [phone] After hours/ weekend pager # [phone]

## 2015-07-14 NOTE — Progress Notes (Signed)
07/14/15  Pharmacy- Heparin 2200   Heparin level 0.31 (900 units/hr)  A/P:   79yo female on Heparin bridge to Coumadin.  Heparin level just within therapeutic range of 0.3-0.7.  Per d/w RN, there have been no issues with the IV pump and no bleeding noted.  1-  Increase Heparin to 950 units/hr 2-  F/U in AM  Marisue Humble, PharmD Clinical Pharmacist [facility]

## 2015-07-14 NOTE — Progress Notes (Addendum)
ANTICOAGULATION CONSULT NOTE - Follow Up Consult  Pharmacy Consult for heparin + Coumadin Indication: atrial fibrillation  Allergies  Allergen Reactions  . Angiotensin Receptor Blockers   . Latex Swelling  . Statins Other (See Comments)    Diffuse cramping; has tried Lipitor, Crestor, Pravachol and simvastatin  . Benicar [Olmesartan] Itching and Rash    Patient Measurements: Height: 5\' 3"  (160 cm) Weight: 167 lb 12.3 oz (76.1 kg) IBW/kg (Calculated) : 52.4 Heparin Dosing Weight: 63 kg  Vital Signs: Temp: 97.9 F (36.6 C) (02/21 1055) Temp Source: Oral (02/21 1055) BP: 155/84 mmHg (02/21 1055) Pulse Rate: 65 (02/21 1055)  Labs:  Recent Labs  07/12/15 0543  07/13/15 0730 07/14/15 0622 07/14/15 0706 07/14/15 1306  HGB 11.2*  < > 11.1* 10.9* 10.3*  --   HCT 35.5*  < > 34.4* 35.3* 33.3*  --   PLT 191  < > 178 208 202  --   LABPROT  --   --   --  17.8*  --   --   INR  --   --   --  1.46  --   --   HEPARINUNFRC 0.36  --  0.22*  --   --  0.45  CREATININE 2.61*  --  3.56*  --  4.23*  --   < > = values in this interval not displayed.  Estimated Creatinine Clearance: 10.9 mL/min (by C-G formula based on Cr of 4.23).   Assessment: Anticoag: hep Rx for r/o ACS and new onset afib, S/p RHC 2/16 and LHC 2/20- Resume heparin and Coumadin.  CHADS2VASC: 5.  INR 1.46, Heparin level in range after resumption at 0.45 units/mL.   Watch for Coumadin/Amiodarone drug interaction with INR.  Goal of Therapy:  INR 2-3 Heparin level 0.3-0.7 units/ml Monitor platelets by anticoagulation protocol: Yes   Plan:  - Continue heparin at 900 units/hr - Confirmatory heparin level at 2100 tonight to ensure rate is adequate - Coumadin 6mg  po x 1 tonight - Daily INR, HL and CBC  Lakashia Collison D. Cobie Marcoux, PharmD, BCPS Clinical Pharmacist Pager: [phone] 07/14/2015 1:37 PM

## 2015-07-14 NOTE — Progress Notes (Signed)
[place] TEAM 2  PROGRESS NOTE  [name] SAY:[id number] DOB: [birth year] DOA: 07/03/2015 PCP: Michiel Sites, MD  Admit HPI / Brief Narrative: 80 yo F Hx ETOH Abuse (5-6 glasses Liquor/day), coronary artery disease, peripheral vascular disease, and CKD III, who was in her usual state of health until sometime in December when she started developing a cough. She was prescribed an antibiotic regimen. Chest x-ray did not show a pneumonia. 4 days prior to her admit she went back to see her primary care physician c/o green sputum with cough. She had a fever up to 102F.   ED evaluation revealed elevated BNP and troponin. New-onset atrial fibrillation with rapid ventricular response. Lactic acid was also elevated. WBC was normal. She was noted to have acute on chronic renal failure.  Patient also noted to have a transaminitis.  HPI/Subjective: The patient has no new complaints, no acute issues reported overnight. Patient was anxious prior to dialysis session today.  Assessment/Plan:  Hypothermia Resolved   Cough /shortness of breath / hypoxia  Influenza PCR negative - ?bronchopneumonia suggested by CT chest - currently off abx - follow clinically - no symptoms to suggest persisting infection presently. Will continue to monitor. No fevers  Acute renal failure in setting of CKD 4 - Now CKD 5 Nephrology following and directing HD - acute failure felt to be due to cardiorenal synd - baseline crt ~2.5  Bilateral renal artery stenosis left renal artery stent and last duplex suggestive of 60-99% reduction in diameter of RRA   Severe acute systolic CHF  EF 23-55% - volume control per HD - Cards following - no acute respiratory distress presently  CAD - CABG x4 2007 - Elevated troponin /NSTEMI cardiac cath completed shows patent grafts with no culprit lesions  Transaminitis - Alcohol Abuse Drinks 5-6 glasses of liquor per day - currently outside of probable alcohol withdrawal  window - holding  Alirocumab - viral hepatitis panel negative - LFTs improving on last check  New onset Atrial fibrillation with RVR TSH normal - converted to NSR w/ addition of amiodarone and with volume removal per hemodialysis - IV heparin with plans to transition to coumadin by cardiology  HTN stable - follow w/ ongoing HD - care w/ med titration to avoid post-HD hypotension   Overweight - Body mass index is 29.73 kg/(m^2).   PVD/PAD S/p fem/pop bypass R 1997 - RAS s/p L stent 2015  HLD  Hyperglycemia  Check A1c in AM  Code Status: FULL Family Communication: d/c family at bedside Disposition Plan: pending final recommendations by cardiology and nephrology  Consultants: Nephrology Cardiology   Procedures: 2/12 TTE - EF 20-25%. Diffuse hypokinesis. Akinesis anteroseptal and inferolateral myocardium. - Aortic valve: There was mild regurgitation - Mitral valve: moderateregurgitation - Left atrium:severely dilated. 2/16 [facility] HD Cath 2/16 R heart cath - mod elevated filling pressures   Antibiotics: cefepime 02/11 > 02/12 vancomycin 2/10 > 02/12 Doxycycline 2/15 > 2/16  DVT prophylaxis: IV Heparin   Objective: Blood pressure 155/84, pulse 65, temperature 97.9 F (36.6 C), temperature source Oral, resp. rate 17, height 5\' 3"  (1.6 m), weight 76.1 kg (167 lb 12.3 oz), SpO2 96 %.  Intake/Output Summary (Last 24 hours) at 07/14/15 1655 Last data filed at 07/14/15 1500  Gross per 24 hour  Intake    120 ml  Output   2923 ml  Net  -2803 ml    Exam: General: No acute respiratory distress - resting comfortably Lungs: equal chest rise, no wheezes  Cardiovascular: no cyanosis Abdomen: no guarding Extremities: No significant cyanosis, or clubbing  Data Reviewed:  Basic Metabolic Panel:  Recent Labs Lab 07/09/15 0420 07/10/15 0425 07/11/15 0500 07/12/15 0543 07/13/15 0730 07/14/15 0706  NA 132* 139 139 139 139 136  K 3.6 3.4* 3.7 4.2 4.3 4.2  CL 91* 96* 97* 99*  100* 97*  CO2 20* 27 25 25 22 24   GLUCOSE 189* 124* 98 108* 96 109*  BUN 177* 90* 104* 45* 73* 86*  CREATININE 4.61* 2.99* 3.50* 2.61* 3.56* 4.23*  CALCIUM 7.9* 8.2* 8.8* 8.6* 8.7* 8.8*  MG 2.6* 2.2 2.1  --   --   --   PHOS 8.7* 5.7* 5.2* 4.1 5.0* 5.9*    CBC:  Recent Labs Lab 07/12/15 0543 07/12/15 1950 07/13/15 0730 07/14/15 0622 07/14/15 0706  WBC 12.5* 12.0* 12.7* 12.0* 12.3*  HGB 11.2* 11.3* 11.1* 10.9* 10.3*  HCT 35.5* 35.7* 34.4* 35.3* 33.3*  MCV 97.0 97.3 98.9 99.2 98.2  PLT 191 202 178 208 202    Liver Function Tests:  Recent Labs Lab 07/08/15 0256 07/09/15 0420 07/10/15 0425 07/11/15 0500 07/12/15 0543 07/13/15 0730 07/14/15 0706  AST 502* 275* 131* 70*  --   --   --   ALT 778* 666* 537* 382*  --   --   --   ALKPHOS 113 100 95 96  --   --   --   BILITOT 0.8 0.8 1.0 1.2  --   --   --   PROT 6.1* 5.8* 5.7* 5.7*  --   --   --   ALBUMIN 3.6 3.3* 3.3* 3.3* 3.1* 3.1* 3.0*   Coags:  Recent Labs Lab 07/09/15 0420 07/14/15 0622  INR 1.34 1.46     Recent Results (from the past 240 hour(s))  Culture, blood (Routine X 2) w Reflex to ID Panel     Status: None   Collection Time: 07/07/15  8:38 AM  Result Value Ref Range Status   Specimen Description BLOOD RIGHT ARM  Final   Special Requests BOTTLES DRAWN AEROBIC AND ANAEROBIC 5CC  Final   Culture   Final    NO GROWTH 5 DAYS Performed at [facility]    Report Status 07/12/2015 FINAL  Final  Culture, blood (Routine X 2) w Reflex to ID Panel     Status: None   Collection Time: 07/07/15  8:47 AM  Result Value Ref Range Status   Specimen Description BLOOD RIGHT HAND  Final   Special Requests IN PEDIATRIC BOTTLE 3CC  Final   Culture   Final    NO GROWTH 5 DAYS Performed at [facility]    Report Status 07/12/2015 FINAL  Final  MRSA PCR Screening     Status: None   Collection Time: 07/08/15  7:54 AM  Result Value Ref Range Status   MRSA by PCR NEGATIVE NEGATIVE Final    Comment:         The GeneXpert MRSA Assay (FDA approved for NASAL specimens only), is one component of a comprehensive MRSA colonization surveillance program. It is not intended to diagnose MRSA infection nor to guide or monitor treatment for MRSA infections.       Scheduled Meds:  Scheduled Meds: . amiodarone  400 mg Oral BID  . antiseptic oral rinse  7 mL Mouth Rinse BID  . [START ON 07/15/2015] feeding supplement (NEPRO CARB STEADY)  237 mL Oral BID BM  . folic acid  1 mg Oral Daily  .  metoprolol  75 mg Oral BID  . multivitamin with minerals  1 tablet Oral Daily  . pantoprazole  40 mg Oral Daily  . sodium chloride flush  3 mL Intravenous Q12H  . sodium chloride flush  3 mL Intravenous Q12H  . thiamine  100 mg Oral Daily  . warfarin  6 mg Oral ONCE-1800  . Warfarin - Pharmacist Dosing Inpatient   Does not apply q1800    Time spent on care of this patient: 35 mins   Penny Pia , MD   [facility] Office  [phone] Pager - Text Page per Amion as per below:  On-Call/Text Page:      [email or web address]      password [email or web address]  If 7PM-7AM, please contact night-coverage [email or web address] Password [email or web address] 07/14/2015, 4:55 PM   LOS: 11 days

## 2015-07-15 DIAGNOSIS — I429 Cardiomyopathy, unspecified: Secondary | ICD-10-CM

## 2015-07-15 DIAGNOSIS — I5022 Chronic systolic (congestive) heart failure: Secondary | ICD-10-CM | POA: Diagnosis present

## 2015-07-15 LAB — CBC
HCT: 34.7 % — ABNORMAL LOW (ref 36.0–46.0)
HEMOGLOBIN: 11.1 g/dL — AB (ref 12.0–15.0)
MCH: 31.9 pg (ref 26.0–34.0)
MCHC: 32 g/dL (ref 30.0–36.0)
MCV: 99.7 fL (ref 78.0–100.0)
PLATELETS: 149 10*3/uL — AB (ref 150–400)
RBC: 3.48 MIL/uL — AB (ref 3.87–5.11)
RDW: 14.1 % (ref 11.5–15.5)
WBC: 13.7 10*3/uL — AB (ref 4.0–10.5)

## 2015-07-15 LAB — RENAL FUNCTION PANEL
ALBUMIN: 2.9 g/dL — AB (ref 3.5–5.0)
ANION GAP: 16 — AB (ref 5–15)
BUN: 33 mg/dL — ABNORMAL HIGH (ref 6–20)
CALCIUM: 8.5 mg/dL — AB (ref 8.9–10.3)
CO2: 25 mmol/L (ref 22–32)
CREATININE: 2.92 mg/dL — AB (ref 0.44–1.00)
Chloride: 97 mmol/L — ABNORMAL LOW (ref 101–111)
GFR, EST AFRICAN AMERICAN: 17 mL/min — AB (ref >60)
GFR, EST NON AFRICAN AMERICAN: 14 mL/min — AB (ref >60)
Glucose, Bld: 95 mg/dL (ref 65–99)
Phosphorus: 3.9 mg/dL (ref 2.5–4.6)
Potassium: 4.4 mmol/L (ref 3.5–5.1)
SODIUM: 138 mmol/L (ref 135–145)

## 2015-07-15 LAB — PROTIME-INR
INR: 1.73 — AB (ref 0.00–1.49)
PROTHROMBIN TIME: 20.3 s — AB (ref 11.6–15.2)

## 2015-07-15 LAB — HEPARIN LEVEL (UNFRACTIONATED): HEPARIN UNFRACTIONATED: 0.52 [IU]/mL (ref 0.30–0.70)

## 2015-07-15 MED ORDER — WARFARIN SODIUM 2.5 MG PO TABS: 2.5000 mg | ORAL_TABLET | Freq: Once | ORAL | Status: DC

## 2015-07-15 MED ORDER — COUMADIN BOOK
Freq: Once | Status: AC
  Administered 2015-07-15: 14:00:00
  Filled 2015-07-15: qty 1

## 2015-07-15 MED ORDER — WARFARIN VIDEO: Freq: Once | Status: DC

## 2015-07-15 MED ORDER — HYDRALAZINE HCL 10 MG PO TABS
10.0000 mg | ORAL_TABLET | Freq: Three times a day (TID) | ORAL | Status: DC
  Administered 2015-07-15 – 2015-07-19 (×11): 10 mg via ORAL
  Filled 2015-07-15 (×11): qty 1

## 2015-07-15 MED ORDER — HEPARIN (PORCINE) IN NACL 100-0.45 UNIT/ML-% IJ SOLN
950.0000 [IU]/h | INTRAMUSCULAR | Status: AC
  Administered 2015-07-15: 950 [IU]/h via INTRAVENOUS

## 2015-07-15 NOTE — Consult Note (Signed)
Chief Complaint: Patient was seen in consultation today for tunneled hemodialysis catheter placement Chief Complaint  Patient presents with  . Shortness of Breath  . URI   at the request of Dr Kathrene Bongo  Referring Physician(s): Dr Kathrene Bongo Dr Dhungel  History of Present Illness: [name] is a 80 y.o. female   CAD/CABG HTN CKD 4 now 5 Known Renal artery stenosis Admitted 07/03/15 with SOB and hypoxia; new onset Afib Pneumonia Worsening renal function Hemodialysis initiated 07/09/2015 through trialysis catheter placed with Vascular team while in heart cath Now need for more stable catheter---need for continued dialysis Scheduled now for tunneled catheter placement per dr Kathrene Bongo request INR 1.73 today Started coumadin 2/21 pm Wbc 13.7 afeb Plan for tunneled cath in am Will hold coumadin and recheck wbc in am   Past Medical History  Diagnosis Date  . CAD in native artery 2007    a. S/p CABG 2007 (LIMA-LAD, SVG-dRCA, SVG-intermediate, SVG-diagonal). b. Nuclear stress test 08/2012: normal, low risk.  . S/P CABG x 4 2007    LIMA-LAD, SVG-Distal RCA, SVG-Ramus, SVG-Diagonal; no echocardiogram done  . PAD (peripheral artery disease) (HCC) 1997    Mild-moderate carotid disease; status post right SFA occlusion with Fem-Below Knee Pop bypass (Dr. Hart Rochester); LEA Dopplers 07/2014: RIGHT - ABI 0.84, CIA/EIA 50-69%, CFA/PFA patent Fem-Pop bypass patent w/ 70-99% anastomotic stenosis, patent Pop A with 3 V runoff; LABI 0.85- L CIA/EIA patent, LPFA 70-99%, dLSFA 50-69%, patent LPopA & 3 V runoff.  . Renovascular hypertension 12/17/2013    a. s/p L RA Stent 11/2013. b. Renal duplex 02/2015: >60% right proximal renal artery stenosis, normal left renal artery s/p stent, f/u 1 yr recommended.  . CKD (chronic kidney disease), stage III   . Essential hypertension   . Hyperlipidemia LDL goal <70   . Carotid artery disease (HCC)     a. Mild-mod carotid disease per  Dr.Berry's note.  . Anemia     Past Surgical History  Procedure Laterality Date  . Cardiac catheterization  December 2007    LAD-90% mid, D19 percent ostial. Circumflex-OM1 90% ostial, 80% mid. RCA 9% proximal, 100% mid  . Femoropopliteal bypass Right 1997    Dr. Hart Rochester: SFA-below knee Pop  . Coronary artery bypass graft  December 2007    LIMA-LAD, SVG-distal RCA, SVG-D1, SVG-OM1/Ramus  . Nm myoview ltd  April 2014    EF 63%, mild fixed anteroapical defect thought to be breast attenuation  . Appendectomy    . Tonsillectomy    . Cataract surgery    . Rectovaginal fistula repair  1997  . [facility]  1981  . Renal artery stent Left 12/16/13    using CO2  . Lower extremity venous doppler  06/13/2011    No evidence of thrombus or thrombophlebitis, no venous insuffiency noted.  . Cardiovascular stress test  08/28/2012    Normal stress nuclear study with likely breast attenuation, low risk stress test.  . Renal angiogram N/A 12/16/2013    Procedure: RENAL ANGIOGRAM;  Surgeon: Runell Gess, MD;  Location: [facility] CATH LAB;  Service: Cardiovascular;  Laterality: N/A;  . Percutaneous stent intervention Left 12/16/2013    Procedure: PERCUTANEOUS STENT INTERVENTION - LEFT RENAL ARTERY;  Surgeon: Runell Gess, MD;  Location: [facility] CATH LAB;  Service: Cardiovascular;  Laterality: Left;  renal  . Cardiac catheterization N/A 07/09/2015    Procedure: Right Heart Cath;  Surgeon: Dolores Patty, MD;  Location: [facility] INVASIVE CV LAB;  Service: Cardiovascular;  Laterality: N/A;  . Cardiac catheterization N/A 07/13/2015    Procedure: Left Heart Cath and Cors/Grafts Angiography;  Surgeon: Marykay Lex, MD;  Location: [facility] INVASIVE CV LAB;  Service: Cardiovascular;  Laterality: N/A;  . Peripheral vascular catheterization N/A 07/13/2015    Procedure: Renal Angiography;  Surgeon: Marykay Lex, MD;  Location: [facility] INVASIVE CV LAB;  Service: Cardiovascular;  Laterality: N/A;    Allergies: Angiotensin receptor  blockers; Latex; Statins; and Benicar  Medications: Prior to Admission medications   Medication Sig Start Date End Date Taking? Authorizing Provider  acetaminophen (TYLENOL) 325 MG tablet Take 650 mg by mouth every 6 (six) hours as needed for moderate pain.   Yes Historical Provider, MD  Alirocumab 75 MG/ML SOPN Inject 75 mg into the skin every 14 (fourteen) days. Will start 09/11/14   Yes Historical Provider, MD  aspirin 81 MG tablet Take 1 tablet (81 mg total) by mouth daily. 12/17/13  Yes Leone Brand, NP  betamethasone dipropionate (DIPROLENE) 0.05 % cream Apply 1 application topically 2 (two) times daily as needed. ECZEMA   Yes Historical Provider, MD  cefUROXime (CEFTIN) 500 MG tablet Take 500 mg by mouth 2 (two) times daily. 06/29/15  Yes Historical Provider, MD  chlorpheniramine-HYDROcodone (TUSSIONEX) 10-8 MG/5ML SUER Take 5 mLs by mouth every 12 (twelve) hours as needed for cough.  06/29/15  Yes Historical Provider, MD  clopidogrel (PLAVIX) 75 MG tablet TAKE 1 TABLET (75 MG TOTAL) BY MOUTH DAILY WITH BREAKFAST. 11/10/14  Yes Marykay Lex, MD  colchicine 0.6 MG tablet Take 0.6 mg by mouth 2 (two) times daily as needed. GOUT FLARE UP.   Yes Historical Provider, MD  febuxostat (ULORIC) 40 MG tablet Take 40 mg by mouth daily.   Yes Historical Provider, MD  furosemide (LASIX) 40 MG tablet Take 40 mg by mouth 2 (two) times daily.   Yes Historical Provider, MD  hydrALAZINE (APRESOLINE) 25 MG tablet Take 25-50 mg by mouth 2 (two) times daily. 1 tablet in the am and 2 tablets in the pm   Yes Historical Provider, MD  LORazepam (ATIVAN) 0.5 MG tablet Take 0.5 mg by mouth 4 (four) times daily as needed for anxiety.   Yes Historical Provider, MD  metoprolol (LOPRESSOR) 50 MG tablet Take 1 tablet (50 mg total) by mouth 2 (two) times daily. 05/31/13  Yes Marykay Lex, MD  Multiple Vitamin (MULTIVITAMIN) tablet Take 1 tablet by mouth daily.   Yes Historical Provider, MD  Multiple Vitamins-Minerals  (ICAPS) CAPS Take 1 capsule by mouth 2 (two) times daily.   Yes Historical Provider, MD  pantoprazole (PROTONIX) 40 MG tablet Take 1 tablet (40 mg total) by mouth daily. 12/17/13  Yes Leone Brand, NP     Family History  Problem Relation Age of Onset  . Breast cancer Mother   . Heart attack Father   . Cancer - Cervical Brother     Social History   Social History  . Marital Status: Married    Spouse Name: N/A  . Number of Children: N/A  . Years of Education: N/A   Social History Main Topics  . Smoking status: Former Smoker    Types: Cigarettes    Quit date: 05/31/1993  . Smokeless tobacco: Never Used  . Alcohol Use: 4.2 oz/week    7 Glasses of wine per week     Comment: 4 shots of whiskey per night   . Drug Use: No  . Sexual Activity: Not Asked  Other Topics Concern  . None   Social History Narrative   She is a married mother of 4 with one child that died at a young age. She has 11 grandchildren and 11 great grandchildren. She is not excessively active. She does get around the house. No routine exercise   She quit smoking in 1993, and has a social alcohol beverage on occasion.   She is a former Hotel manager working as a Lawyer.     Review of Systems: A 12 point ROS discussed and pertinent positives are indicated in the HPI above.  All other systems are negative.  Review of Systems  Constitutional: Negative for fever and activity change.  Respiratory: Positive for shortness of breath.   Genitourinary: Positive for decreased urine volume.  Neurological: Positive for weakness.  Psychiatric/Behavioral: Negative for behavioral problems and confusion.    Vital Signs: BP 147/57 mmHg  Pulse 52  Temp(Src) 97.7 F (36.5 C) (Oral)  Resp 18  Ht 5\' 3"  (1.6 m)  Wt 160 lb 7.9 oz (72.8 kg)  BMI 28.44 kg/m2  SpO2 99%  Physical Exam  Constitutional: She is oriented to person, place, and time.  Cardiovascular: Normal rate and regular rhythm.     Pulmonary/Chest: Effort normal. She has wheezes.  Abdominal: Soft. Bowel sounds are normal.  Musculoskeletal: Normal range of motion.  Neurological: She is alert and oriented to person, place, and time.  Skin: Skin is warm and dry.  Psychiatric: She has a normal mood and affect. Her behavior is normal. Judgment and thought content normal.  Nursing note and vitals reviewed.   Mallampati Score:  MD Evaluation Airway: WNL Heart: WNL Heart  comments:  (Reduced LVEF) Abdomen: WNL Chest/ Lungs: WNL ASA  Classification: 3 Mallampati/Airway Score: One  Imaging: Ct Abdomen Pelvis Wo Contrast  07/07/2015  CLINICAL DATA:  Small bowel obstruction. Perforated viscus. Abnormal x-rays appear EXAM: CT ABDOMEN AND PELVIS WITHOUT CONTRAST TECHNIQUE: Multidetector CT imaging of the abdomen and pelvis was performed following the standard protocol without IV contrast. COMPARISON:  Plain films performed today. FINDINGS: Mild cardiomegaly. Small left pleural effusion and trace right effusion. Minimal dependent atelectasis in the lung bases. Liver, gallbladder, spleen, pancreas, adrenals have an unremarkable unenhanced appearance. Vascular calcifications in the renal hila bilaterally. No ureteral stones or hydronephrosis. Urinary bladder is unremarkable. Scattered colonic diverticula.  No active diverticulitis. The previously seen small bowel prominence is less evident by CT. There is mild prominence of upper abdominal small bowel loops with decompressed distal small bowel loops. Contrast material has made it into the right side of the colon. I do not see any transition point. No CT evidence for obstruction. Aorta and iliac vessels are heavily calcified, non aneurysmal. Prior hysterectomy. No adnexal masses. No free fluid, free air or adenopathy. No acute bony abnormality or focal bone lesion. IMPRESSION: No CT evidence for small bowel obstruction. No evidence of free air as questioned on plain films. Scattered  colonic diverticulosis.  No active diverticulitis. Small left pleural effusion.  Trace right pleural effusion. Electronically Signed   By: Charlett Nose M.D.   On: 07/07/2015 16:01   Dg Chest 2 View  07/12/2015  CLINICAL DATA:  Pneumonia EXAM: CHEST  2 VIEW COMPARISON:  07/05/2015 chest radiograph.  07/08/2015 chest CT. FINDINGS: Right internal jugular central venous catheter terminates in the lower third of the superior vena cava. CABG clips overlie the mediastinum. Sternotomy wires appear aligned and intact. Stable cardiomediastinal silhouette with mild cardiomegaly. No pneumothorax.  No pleural effusion. Stable mild pulmonary edema. No consolidative airspace disease. IMPRESSION: Mild congestive heart failure, stable. No consolidative airspace disease. Electronically Signed   By: Delbert Phenix M.D.   On: 07/12/2015 14:20   Dg Chest 2 View  07/05/2015  CLINICAL DATA:  80 year old female with shortness of breath. EXAM: CHEST  2 VIEW COMPARISON:  07/03/2015 and prior exams FINDINGS: Cardiomegaly, CABG changes and mild pulmonary vascular congestion again noted. There is no evidence of focal airspace disease, pulmonary edema, suspicious pulmonary nodule/mass, pleural effusion, or pneumothorax. No acute bony abnormalities are identified. IMPRESSION: Cardiomegaly with mild pulmonary vascular congestion. Electronically Signed   By: Harmon Pier M.D.   On: 07/05/2015 09:26   Dg Chest 2 View  07/03/2015  CLINICAL DATA:  Cough, congestion, shortness of breath, and weakness for 3 weeks. EXAM: CHEST  2 VIEW COMPARISON:  05/30/2006 FINDINGS: Mild enlargement of the cardiopericardial silhouette, with mild cephalization of blood flow but no overt edema. No Kerley B-lines observed. Prior CABG.  Atherosclerotic calcification of the aortic arch. Thoracic spondylosis.  No pleural effusion. IMPRESSION: 1. Stable mild enlargement of the cardiopericardial silhouette, with pulmonary venous hypertension but without overt edema. 2.  Prior CABG. 3. Atherosclerotic aortic arch. Electronically Signed   By: Gaylyn Rong M.D.   On: 07/03/2015 12:06   Ct Chest Wo Contrast  07/08/2015  CLINICAL DATA:  Shortness of breath. EXAM: CT CHEST WITHOUT CONTRAST TECHNIQUE: Multidetector CT imaging of the chest was performed following the standard protocol without IV contrast. COMPARISON:  Radiography an abdominal CT 07/07/2015 FINDINGS: There is a small amount of pleural fluid layering dependently, more on the left than the right. There is no pericardial fluid. There is atherosclerosis of the aorta and its branch vessels including the coronary arteries. No enlarged hilar or mediastinal lymph nodes are visible. Mild prominence of the septal lines in the lungs could be associated with mild edema/ fluid overload. No advanced pulmonary edema. Micronodular opacities within both lungs, probably tree in bud opacities, most likely to reflect bronchopneumonia with atypical organisms such as mycobacterium. The differential diagnosis does include an early manifestation of edema and micronodular metastatic disease, but those are felt less likely. Scans in the upper abdomen do not show any acute finding. IMPRESSION: Advanced atherosclerosis of the aorta and its branch vessels. Probable mild edema/fluid overload. Small effusions layering dependently, left larger than right. Bilateral tree in bud opacities usually indicative of bronchopneumonia with atypical organisms such as mycobacterium. Differential diagnosis does include an early manifestation of edema and micronodular metastatic disease, but those are felt less likely. Electronically Signed   By: Paulina Fusi M.D.   On: 07/08/2015 10:45   US Abdomen Complete  07/04/2015  CLINICAL DATA:  80 year old female with transaminitis/ elevated LFTs. Patient with chronic renal disease. EXAM: ABDOMEN ULTRASOUND COMPLETE COMPARISON:  None. FINDINGS: Gallbladder: The gallbladder is unremarkable. There is no evidence of  acute cholecystitis or cholelithiasis. Common bile duct: Diameter: 5 mm. There is no evidence of intrahepatic or extrahepatic biliary dilatation. Liver: Slightly increased and heterogeneous hepatic echotexture identified without focal lesions. IVC: No abnormality visualized. Pancreas: Visualized portion unremarkable. Spleen: Size and appearance within normal limits. Right Kidney: Length: 11.1 cm. Cortical thinning and increased renal echogenicity noted. There is no evidence of hydronephrosis or solid mass. Left Kidney: Length: 11.1 cm. Cortical thinning and increased renal echogenicity noted. There is no evidence of hydronephrosis or solid mass. Abdominal aorta: No aneurysm visualized. Other findings: None. IMPRESSION: Slightly increased and heterogeneous hepatic echotexture  which is nonspecific but may represent hepatic steatosis. Cortical thinning and increased renal echogenicity compatible with medical renal disease. No other significant abnormalities. Electronically Signed   By: Harmon Pier M.D.   On: 07/04/2015 12:29   Dg Abd Acute W/chest  07/07/2015  CLINICAL DATA:  Hypothermia, history of CABG, former smoking history EXAM: DG ABDOMEN ACUTE W/ 1V CHEST COMPARISON:  Chest x-ray of 07/05/2015 and ultrasound of the abdomen of 07/04/2015 FINDINGS: No active infiltrate or effusion is seen. Cardiomegaly is stable. Median sternotomy sutures are noted from prior CABG. Supine and left lateral decubitus films of the abdomen were obtained. Better seen on the supine film there are somewhat distended loops of small bowel, and the colon and distal small bowel appears decompressed. A partial small bowel obstruction is a definite consideration. On the left lower decubitus films, there is some lucency just above the right lobe of liver, and a small amount of free intraperitoneal air cannot be excluded. CT the abdomen pelvis is recommended to assess for possible free air as well as evaluate the possibility of partial small  bowel obstruction. No opaque calculi are seen. The bones are osteopenic. IMPRESSION: 1. Prominent loops of small bowel may indicate a partial small bowel obstruction. 2. Cannot exclude a small amount of free air on the decubitus film. In view of these findings, CT of the abdomen pelvis at this time is recommended. 3. No active lung disease.  Cardiomegaly. I spoke with Dr. Ramiro Harvest concerning the findings of this study at 12:51 p.m. Electronically Signed   By: Dwyane Dee M.D.   On: 07/07/2015 12:52    Labs:  CBC:  Recent Labs  07/13/15 0730 07/14/15 0622 07/14/15 0706 07/15/15 0832  WBC 12.7* 12.0* 12.3* 13.7*  HGB 11.1* 10.9* 10.3* 11.1*  HCT 34.4* 35.3* 33.3* 34.7*  PLT 178 208 202 149*    COAGS:  Recent Labs  07/04/15 0427 07/09/15 0420 07/14/15 0622 07/15/15 0832  INR 1.53* 1.34 1.46 1.73*    BMP:  Recent Labs  07/12/15 0543 07/13/15 0730 07/14/15 0706 07/15/15 0832  NA 139 139 136 138  K 4.2 4.3 4.2 4.4  CL 99* 100* 97* 97*  CO2 25 22 24 25   GLUCOSE 108* 96 109* 95  BUN 45* 73* 86* 33*  CALCIUM 8.6* 8.7* 8.8* 8.5*  CREATININE 2.61* 3.56* 4.23* 2.92*  GFRNONAA 17* 11* 9* 14*  GFRAA 19* 13* 11* 17*    LIVER FUNCTION TESTS:  Recent Labs  07/08/15 0256 07/09/15 0420 07/10/15 0425 07/11/15 0500 07/12/15 0543 07/13/15 0730 07/14/15 0706 07/15/15 0832  BILITOT 0.8 0.8 1.0 1.2  --   --   --   --   AST 502* 275* 131* 70*  --   --   --   --   ALT 778* 666* 537* 382*  --   --   --   --   ALKPHOS 113 100 95 96  --   --   --   --   PROT 6.1* 5.8* 5.7* 5.7*  --   --   --   --   ALBUMIN 3.6 3.3* 3.3* 3.3* 3.1* 3.1* 3.0* 2.9*    TUMOR MARKERS: No results for input(s): AFPTM, CEA, CA199, CHROMGRNA in the last 8760 hours.  Assessment and Plan:  CKD 5  Worsening renal function Trialysis catheter existing Rt IJ Need for tunneled catheter placement Scheduled for procedure 2/23 in IR Will recheck INR and wbc 2/23 afeb Risks and Benefits  discussed  with the patient including, but not limited to bleeding, infection, vascular injury, pneumothorax which may require chest tube placement, air embolism or even death All of the patient's questions were answered, patient is agreeable to proceed. Consent signed and in chart.   Thank you for this interesting consult.  I greatly enjoyed meeting Stephaniemarie W Brooker and look forward to participating in their care.  A copy of this report was sent to the requesting provider on this date.  Electronically Signed: Ralene Muskrat A 07/15/2015, 1:06 PM   I spent a total of 20 Minutes    in face to face in clinical consultation, greater than 50% of which was counseling/coordinating care for tunneled HD catheter

## 2015-07-15 NOTE — Progress Notes (Signed)
[name] Progress Note   Subjective:  No new complaints- s/p HD yest, removed 2200 tolerated well  - cath yesterday- no obvious culprit lesion and no intervention was required on renal arteries - only 200 of UOP- has decided to continue with this trial of HD   Filed Vitals:   07/14/15 2104 07/15/15 0500 07/15/15 0521 07/15/15 0534  BP: 120/73  140/45 155/67  Pulse: 97  56   Temp: 99.3 F (37.4 C)  97.6 F (36.4 C)   TempSrc: Oral  Oral   Resp: 16  18   Height:      Weight: 75.5 kg (166 lb 7.2 oz) 72.8 kg (160 lb 7.9 oz)    SpO2: 92%  99%    I/O last 3 completed shifts: In: 240 [P.O.:240] Out: 2923 [Urine:650; Other:2273]    Exam: General: Well developed, well nourished WF in no acute distress.  Neck: RIJ HD cath in place (2/16) Lungs: Coarse crackles R ant chest and few R base Heart:Regular, S1 & S2 normal, 2/6 apical murmur but no rubs, or gallop.  Abdomen: Soft, non-tender, non-distended with normoactive bowel sounds.  Extremities:  No significant edema.  Neuro: Alert and oriented X 3.    Inpatient medications: . amiodarone  400 mg Oral BID  . antiseptic oral rinse  7 mL Mouth Rinse BID  . coumadin book   Does not apply Once  . feeding supplement (NEPRO CARB STEADY)  237 mL Oral BID BM  . folic acid  1 mg Oral Daily  . metoprolol  75 mg Oral BID  . multivitamin with minerals  1 tablet Oral Daily  . pantoprazole  40 mg Oral Daily  . sodium chloride flush  3 mL Intravenous Q12H  . sodium chloride flush  3 mL Intravenous Q12H  . thiamine  100 mg Oral Daily  . warfarin  2.5 mg Oral ONCE-1800  . warfarin   Does not apply Once  . Warfarin - Pharmacist Dosing Inpatient   Does not apply q1800   Infusions . heparin 950 Units/hr (07/14/15 2225)   Prn medicines sodium chloride, sodium chloride, acetaminophen **OR** acetaminophen, diphenhydrAMINE, levalbuterol, ondansetron **OR** ondansetron (ZOFRAN) IV, sodium chloride flush   Recent Labs Lab  07/13/15 0730 07/14/15 0706 07/15/15 0832  NA 139 136 138  K 4.3 4.2 4.4  CL 100* 97* 97*  CO2 22 24 25   GLUCOSE 96 109* 95  BUN 73* 86* 33*  CREATININE 3.56* 4.23* 2.92*  CALCIUM 8.7* 8.8* 8.5*  PHOS 5.0* 5.9* 3.9    Recent Labs Lab 07/09/15 0420 07/10/15 0425 07/11/15 0500  07/13/15 0730 07/14/15 0706 07/15/15 0832  AST 275* 131* 70*  --   --   --   --   ALT 666* 537* 382*  --   --   --   --   ALKPHOS 100 95 96  --   --   --   --   BILITOT 0.8 1.0 1.2  --   --   --   --   PROT 5.8* 5.7* 5.7*  --   --   --   --   ALBUMIN 3.3* 3.3* 3.3*  < > 3.1* 3.0* 2.9*  < > = values in this interval not displayed.  Recent Labs Lab 07/14/15 0622 07/14/15 0706 07/15/15 0832  WBC 12.0* 12.3* 13.7*  HGB 10.9* 10.3* 11.1*  HCT 35.3* 33.3* 34.7*  MCV 99.2 98.2 99.7  PLT 208 202 149*   Background 80 y.o. year-old  with hx of CAD/ CABG, PAD, HTN , HL and CKD. Followed by Dr. Briant Cedar for CKD with baseline creat -of 2, RAS (L RA stent and last duplex 2016 >60% right RAS)  She presented to ED with SOB and hypoxia.Hospital eval demonstrated new drop in EF to 20-25%, new onset Afib, PNA. With diuresis creatinine progressive rose to around 4.6, BUN in the 170's.  HD was initiated 2/16 via temp cath placed that date with understanding that this could be permanent RF.   Assessment: 1. SOB/ cough/ hypoxemia - combination of PNA +  CHF. Very poor LV function (EF 20-25%), and new afib.  ATB's completed. Right heart cath confirmed high filling pressures, probable cardiorenal component to her renal failure and she is clinically improved with dialysis and UF  2. CKD4 (baseline creatinine around 2-2.5, known RAS,  -> now CKD/ESRD (AKI on CKD4) - worsened renal fx with diuresis for CHF, very low EF, new AF - cardiorenal component.  Decided with family on a trial of dialysis and she has done very well with HD 3 treatments in-has decided to continue at least in the short term.  Will look to convert her  vascath to permcath in the next couple of days- working on OP spot- lives near [place].  Will do HD tomorrow   1. New AF - On amio,. Has spontaneously converted to SR. Still on IV heparin- also started coumadin- may delay discharge  2. CAD hx CABG with severe dilated / CM EF 20-25% -cath without culprit lesion- medical management  3. HTN meds- metoprolol 4. Anemia- hgb over 11- no treatment needed yet - will check iron stores  5. Bones- phos 3.9 no binders - will check PTH      [name] A   07/15/2015, 10:27 AM

## 2015-07-15 NOTE — Progress Notes (Signed)
[facility] PROGRESS NOTE  [name]:[id number] DOB: 10-21-1935 DOA: 07/03/2015 PCP: Michiel Sites, MD  brief narrative  80 year old female with history of alcohol abuse, coronary artery disease, peripheral vascular disease and seek ED stage III who was sent to the ED by her PCP with fever of 102F and ongoing cough. In the ED she had elevated BNP and troponin with new onset A. fib with RVR as well as elevated lactic acid. She was also found to have acute on chronic kidney disease along with transaminitis. Patient was admitted to the hospital with sepsis and acute kidney injury. Hospital course complicated with need for dialysis, bilateral renal artery stenosis and severe acute systolic CHF.  Assessment/Plan: Sepsis secondary to pneumonia resolved.. Now off antibiotics.  Acute on chronic kidney disease stage V Now requiring hemodialysis. Working on setting up outpatient dialysis center. Nephrology on board and appreciate recommendations. Has had urine output of about 200 mL past 24 hours.  Acute systolic CHF with elevated troponin 2-D echo showing EF of 20-25%. Appears euvolemic with volume management with dialysis. Appreciate cardiology recommendations. Cardiac cath done on 2/20 showed showed patent grafts with no new lesions. Cardiology suggests this is nonischemic cardiomyopathy with underlying A. fib versus volume overload from renal insufficiency. -Continue metoprolol Added hydralazine.  New onset A. fib with RVR Hypertension and alcohol abuse may be contributing. Now in normal sinus rhythm after dialysis and addition of amiodarone. Currently on heparin drip. Started Coumadin. Continue metoprolol for rate control.  Bilateral renal artery stenosis Renal artery angiograms showed no changes from prior study. A tent left renal artery stent with mild in-stent restenosis and proximal right renal artery stenosis is stable.  CAD status post CABG in  2007   Transaminitis Possibly associated with alcoholic hepatitis. Counseled on cessation. Follow-up LFTs improving. Recheck in am.   DVT prophylaxis: On IV heparin and Coumadin Diet: Renal/cardiac  Code Status: Full code Family Communication: Husband and son at bedside Disposition Plan: Pending further changes in her function with dialysis. Awaiting outpatient dialysis set up.   Consultants:  Renal  Cardiology  IR  Procedures:  Left heart catheterization and renal angiogram on 2/20  Tunneled HD catheter placement (pending)  Antibiotics:  None  HPI/Subjective: Seen and examined. Denies any specific symptoms. reports being able to urinate better yesterday.  Objective: Filed Vitals:   07/15/15 0534 07/15/15 1042  BP: 155/67 147/57  Pulse:  52  Temp:  97.7 F (36.5 C)  Resp:  18    Intake/Output Summary (Last 24 hours) at 07/15/15 1623 Last data filed at 07/15/15 1414  Gross per 24 hour  Intake    240 ml  Output      0 ml  Net    240 ml   Filed Weights   07/14/15 0655 07/14/15 2104 07/15/15 0500  Weight: 76.1 kg (167 lb 12.3 oz) 75.5 kg (166 lb 7.2 oz) 72.8 kg (160 lb 7.9 oz)    Exam:   General:  Elderly female not in distress  HEENT: Right IJ, no pallor, moist mucosa  Chest: Clear bilaterally  CVS: Normal S1 and S2, no murmurs or gallop  GI: Soft, nondistended, nontender  musculoskeletal: Warm, no edema  Data Reviewed: Basic Metabolic Panel:  Recent Labs Lab 07/09/15 0420 07/10/15 0425 07/11/15 0500 07/12/15 0543 07/13/15 0730 07/14/15 0706 07/15/15 0832  NA 132* 139 139 139 139 136 138  K 3.6 3.4* 3.7 4.2 4.3 4.2 4.4  CL 91* 96* 97* 99* 100* 97* 97*  CO2 20* 27 25 25 22 24 25   GLUCOSE 189* 124* 98 108* 96 109* 95  BUN 177* 90* 104* 45* 73* 86* 33*  CREATININE 4.61* 2.99* 3.50* 2.61* 3.56* 4.23* 2.92*  CALCIUM 7.9* 8.2* 8.8* 8.6* 8.7* 8.8* 8.5*  MG 2.6* 2.2 2.1  --   --   --   --   PHOS 8.7* 5.7* 5.2* 4.1 5.0* 5.9* 3.9    Liver Function Tests:  Recent Labs Lab 07/09/15 0420 07/10/15 0425 07/11/15 0500 07/12/15 0543 07/13/15 0730 07/14/15 0706 07/15/15 0832  AST 275* 131* 70*  --   --   --   --   ALT 666* 537* 382*  --   --   --   --   ALKPHOS 100 95 96  --   --   --   --   BILITOT 0.8 1.0 1.2  --   --   --   --   PROT 5.8* 5.7* 5.7*  --   --   --   --   ALBUMIN 3.3* 3.3* 3.3* 3.1* 3.1* 3.0* 2.9*   No results for input(s): LIPASE, AMYLASE in the last 168 hours. No results for input(s): AMMONIA in the last 168 hours. CBC:  Recent Labs Lab 07/12/15 1950 07/13/15 0730 07/14/15 0622 07/14/15 0706 07/15/15 0832  WBC 12.0* 12.7* 12.0* 12.3* 13.7*  HGB 11.3* 11.1* 10.9* 10.3* 11.1*  HCT 35.7* 34.4* 35.3* 33.3* 34.7*  MCV 97.3 98.9 99.2 98.2 99.7  PLT 202 178 208 202 149*   Cardiac Enzymes: No results for input(s): CKTOTAL, CKMB, CKMBINDEX, TROPONINI in the last 168 hours. BNP (last 3 results)  Recent Labs  07/03/15 1236  BNP 4223.3*    ProBNP (last 3 results) No results for input(s): PROBNP in the last 8760 hours.  CBG:  Recent Labs Lab 07/13/15 0431  GLUCAP 99    Recent Results (from the past 240 hour(s))  Culture, blood (Routine X 2) w Reflex to ID Panel     Status: None   Collection Time: 07/07/15  8:38 AM  Result Value Ref Range Status   Specimen Description BLOOD RIGHT ARM  Final   Special Requests BOTTLES DRAWN AEROBIC AND ANAEROBIC 5CC  Final   Culture   Final    NO GROWTH 5 DAYS Performed at [facility]    Report Status 07/12/2015 FINAL  Final  Culture, blood (Routine X 2) w Reflex to ID Panel     Status: None   Collection Time: 07/07/15  8:47 AM  Result Value Ref Range Status   Specimen Description BLOOD RIGHT HAND  Final   Special Requests IN PEDIATRIC BOTTLE 3CC  Final   Culture   Final    NO GROWTH 5 DAYS Performed at [facility]    Report Status 07/12/2015 FINAL  Final  MRSA PCR Screening     Status: None   Collection Time: 07/08/15   7:54 AM  Result Value Ref Range Status   MRSA by PCR NEGATIVE NEGATIVE Final    Comment:        The GeneXpert MRSA Assay (FDA approved for NASAL specimens only), is one component of a comprehensive MRSA colonization surveillance program. It is not intended to diagnose MRSA infection nor to guide or monitor treatment for MRSA infections.      Studies: No results found.  Scheduled Meds: . amiodarone  400 mg Oral BID  . antiseptic oral rinse  7 mL Mouth Rinse BID  . feeding supplement (  NEPRO CARB STEADY)  237 mL Oral BID BM  . folic acid  1 mg Oral Daily  . hydrALAZINE  10 mg Oral 3 times per day  . metoprolol  75 mg Oral BID  . multivitamin with minerals  1 tablet Oral Daily  . pantoprazole  40 mg Oral Daily  . sodium chloride flush  3 mL Intravenous Q12H  . sodium chloride flush  3 mL Intravenous Q12H  . thiamine  100 mg Oral Daily  . warfarin   Does not apply Once   Continuous Infusions: . heparin 950 Units/hr (07/15/15 1419)      Time spent: 25 minutes    Carleah Yablonski  [facility] Pager [phone] If 7PM-7AM, please contact night-coverage at [email or web address], password [facility] 07/15/2015, 4:23 PM  LOS: 12 days

## 2015-07-15 NOTE — Progress Notes (Signed)
07/15/2015 11:03 AM Hemodialysis Outpatient Note; this patient has been accepted at the [facility] center on a Tuesday, Thursday and Saturday 2nd shift schedule. The center was unable to give patient her desired schedule but will put [name] on a waiting list. The patient can begin treatment on Tuesday February 28th and must arrive at the center for 11:45AM to sign consents and paperwork. Thank you.Tilman Neat

## 2015-07-15 NOTE — Progress Notes (Signed)
SUBJECTIVE:  80 y.o. female with a past medical history of coronary artery disease, peripheral vascular disease, chronic kidney disease stage III, who was in her usual state of health until sometime in December when she started developing a cough which was dry.  Echo revealed chronic systolic CHF with EF of 20-25% Cath on 2/20 shows patent grafts with no culprit lesions. Renal artery angiograms show no changes from previous study      . amiodarone  400 mg Oral BID  . antiseptic oral rinse  7 mL Mouth Rinse BID  . feeding supplement (NEPRO CARB STEADY)  237 mL Oral BID BM  . folic acid  1 mg Oral Daily  . metoprolol  75 mg Oral BID  . multivitamin with minerals  1 tablet Oral Daily  . pantoprazole  40 mg Oral Daily  . sodium chloride flush  3 mL Intravenous Q12H  . sodium chloride flush  3 mL Intravenous Q12H  . thiamine  100 mg Oral Daily  . Warfarin - Pharmacist Dosing Inpatient   Does not apply q1800   . heparin 950 Units/hr (07/14/15 2225)    OBJECTIVE: Physical Exam: Filed Vitals:   07/14/15 2104 07/15/15 0500 07/15/15 0521 07/15/15 0534  BP: 120/73  140/45 155/67  Pulse: 97  56   Temp: 99.3 F (37.4 C)  97.6 F (36.4 C)   TempSrc: Oral  Oral   Resp: 16  18   Height:      Weight: 166 lb 7.2 oz (75.5 kg) 160 lb 7.9 oz (72.8 kg)    SpO2: 92%  99%     Intake/Output Summary (Last 24 hours) at 07/15/15 1013 Last data filed at 07/14/15 1500  Gross per 24 hour  Intake    240 ml  Output   2473 ml  Net  -2233 ml     GEN- The patient is well appearing, alert and oriented x 3 today.   Head- normocephalic, atraumatic Eyes-  Sclera clear, conjunctiva pink Ears- hearing intact Oropharynx- clear Neck- supple, CVL catheter in place Lungs- bibasilar rales, normal work of breathing Heart- Regular rate and rhythm  GI- soft, NT, ND, + BS Extremities- no clubbing, cyanosis, +edema Skin- no rash or lesion Psych- euthymic mood, full affect Neuro- strength and sensation  are intact  LABS: Basic Metabolic Panel:  Recent Labs  65/68/12 0706 07/15/15 0832  NA 136 138  K 4.2 4.4  CL 97* 97*  CO2 24 25  GLUCOSE 109* 95  BUN 86* 33*  CREATININE 4.23* 2.92*  CALCIUM 8.8* 8.5*  PHOS 5.9* 3.9   Liver Function Tests:  Recent Labs  07/14/15 0706 07/15/15 0832  ALBUMIN 3.0* 2.9*   No results for input(s): LIPASE, AMYLASE in the last 72 hours. CBC:  Recent Labs  07/14/15 0706 07/15/15 0832  WBC 12.3* 13.7*  HGB 10.3* 11.1*  HCT 33.3* 34.7*  MCV 98.2 99.7  PLT 202 149*    ASSESSMENT AND PLAN:    1. SOB/ cough/ hypoxemia - multifactoral (advanced and decompensated renal failure, acute on chronic combined systolic/diastolic CHF, afib) clinically improved with dialysis.   Continue to monitor.    2. Acute on chronic renal failure (now CKD 5-->AKI on CKD4).  Renal arteries appear to be stable  .  Tolerating dialysis very well and much improved clinically.     Likely exacerbated by her ETOH abuse  3. Paroxysmal  atrial fibrillation- she has converted to sinus rhythm with fluid removal.    Heparin drip.  Starting  coumadin   4.  CAD hx CABG with severe dilated / CM EF 20-25% .    5. HTN-  on  coreg she may benefit from hydralazine.  Will start 10 mg  TID    Will sign off.   Call for questions.   Kishia Shackett, Deloris Ping, MD  07/15/2015 10:13 AM    [facility] [address] [place],  [address] [place], [place]  [address] Pager [phone] Phone: [phone]; Fax: [phone]

## 2015-07-15 NOTE — Progress Notes (Signed)
Patient stated that she only urinated once tonight and it was not a lot- RN bladder scanned patient. Patient had 179 residual in her bladder. Told patient to call if she would like to try using the RR again.

## 2015-07-15 NOTE — Progress Notes (Addendum)
ANTICOAGULATION CONSULT NOTE - Follow Up Consult  Pharmacy Consult for heparin + Coumadin Indication: atrial fibrillation  Allergies  Allergen Reactions  . Angiotensin Receptor Blockers   . Latex Swelling  . Statins Other (See Comments)    Diffuse cramping; has tried Lipitor, Crestor, Pravachol and simvastatin  . Benicar [Olmesartan] Itching and Rash    Patient Measurements: Height: 5\' 3"  (160 cm) Weight: 160 lb 7.9 oz (72.8 kg) IBW/kg (Calculated) : 52.4 Heparin Dosing Weight: 63 kg  Vital Signs: Temp: 97.6 F (36.4 C) (02/22 0521) Temp Source: Oral (02/22 0521) BP: 155/67 mmHg (02/22 0534) Pulse Rate: 56 (02/22 0521)  Labs:  Recent Labs  07/13/15 0730 07/14/15 0622 07/14/15 0706 07/14/15 1306 07/14/15 2055 07/15/15 0832  HGB 11.1* 10.9* 10.3*  --   --  11.1*  HCT 34.4* 35.3* 33.3*  --   --  34.7*  PLT 178 208 202  --   --  149*  LABPROT  --  17.8*  --   --   --  20.3*  INR  --  1.46  --   --   --  1.73*  HEPARINUNFRC 0.22*  --   --  0.45 0.31 0.52  CREATININE 3.56*  --  4.23*  --   --  2.92*    Estimated Creatinine Clearance: 15.4 mL/min (by C-G formula based on Cr of 2.92).   Assessment: Anticoag: hep Rx for r/o ACS and new onset afib, S/p RHC 2/16 and LHC 2/20- Resume heparin and Coumadin.  CHADS2VASC: 5.  INR 1.46>1.73- quick jump likely from amiodaron. Heparin level remains in range on 950 units/hr.   Goal of Therapy:  INR 2-3 Heparin level 0.3-0.7 units/ml Monitor platelets by anticoagulation protocol: Yes   Plan:  - Continue heparin at 950 units/hr - Coumadin 2.5mg  po x 1 tonight - Daily INR, HL and CBC - Follow for s/s bleeding - Follow for plans for DCCV- appears she was back in sinus yesterday  Marisa Hage D. Yizel Canby, PharmD, BCPS Clinical Pharmacist Pager: [phone] 07/15/2015 10:16 AM   ADDENDUM Spoke with Dr. Kathrene Bongo- plans to convert temporary catheter to permanent HD cath. Warfarin to be placed on hold.  Plan: -d/c warfarin  order, INR order, and warfarin per pharmacy consult -pharmacy will follow for resumption of warfarin -note: warfarin book and video ordered for patient today. Will provide education to patient prior to discharge  Ellieana Dolecki D. Jaylene Schrom, PharmD, BCPS Clinical Pharmacist Pager: [phone] 07/15/2015 10:46 AM   ADDENDUM #2 IR plans to place tunneled cath on 2/23- requested to hold heparin prior to procedure.  Plan: Stop heparin 950 units/hr at midnight. Communicated with RN Mathis Fare.  Takia Runyon D. Livvy Spilman, PharmD, BCPS Clinical Pharmacist Pager: [phone] 07/15/2015 1:42 PM

## 2015-07-15 NOTE — Progress Notes (Signed)
Physical Therapy Treatment Patient Details Name: [name] MRN: [id number] DOB: May 01, 1936 Today's Date: 07/15/2015    History of Present Illness 80 yo female admitted with acute on chronic renal failure, Afib. Hx of ETOH abuse    PT Comments    Patient seen for mobility progression. Doing much better this session, more alert and engaged. Patient making steady progress towards PT goals at this time. Tolerated ambulation without device as well as bilateral LE exercises and instruction for HEP. Family present during session. Happy with progress. Will continue to see and progress as tolerated.   Follow Up Recommendations  Home health PT;Supervision/Assistance - 24 hour     Equipment Recommendations  Rolling walker with 5" wheels    Recommendations for Other Services       Precautions / Restrictions Precautions Precautions: Fall Precaution Comments: monitor sats, HR Restrictions Weight Bearing Restrictions: No    Mobility  Bed Mobility               General bed mobility comments: up in chair family present  Transfers Overall transfer level: Needs assistance Equipment used: None Transfers: Sit to/from Stand Sit to Stand: Min guard         General transfer comment: Min guard for safety, no physical assist required  Ambulation/Gait Ambulation/Gait assistance: Min guard Ambulation Distance (Feet): 210 Feet Assistive device: None Gait Pattern/deviations: Step-through pattern;Decreased stride length;Drifts right/left;Narrow base of support Gait velocity: decreased   General Gait Details: slow gait improved with verbal cues. Patient mobilizing without device this session. Patient with modest instability but was able to self correct.   Stairs            Wheelchair Mobility    Modified Rankin (Stroke Patients Only)       Balance Overall balance assessment: Needs assistance Sitting-balance support: No upper extremity supported Sitting  balance-Leahy Scale: Good     Standing balance support: No upper extremity supported Standing balance-Leahy Scale: Fair Standing balance comment: able to perform static standing without physical assist                     Cognition Arousal/Alertness: Awake/alert (much improved compared to previous session) Behavior During Therapy: WFL for tasks assessed/performed Overall Cognitive Status: Within Functional Limits for tasks assessed                      Exercises General Exercises - Lower Extremity Ankle Circles/Pumps: AROM;Both;5 reps Long Arc Quad: AROM;Both;10 reps (x2 sets) Hip Flexion/Marching: AROM;Both;10 reps (x2 sets)    General Comments        Pertinent Vitals/Pain Pain Assessment: No/denies pain    Home Living                      Prior Function            PT Goals (current goals can now be found in the care plan section) Acute Rehab PT Goals Patient Stated Goal: to go home PT Goal Formulation: With patient/family Time For Goal Achievement: 07/19/15 Potential to Achieve Goals: Good Progress towards PT goals: Progressing toward goals    Frequency  Min 3X/week    PT Plan Current plan remains appropriate    Co-evaluation             End of Session Equipment Utilized During Treatment: Gait belt Activity Tolerance: Patient limited by fatigue Patient left: in chair;with call bell/phone within reach;with family/visitor present     Time:  1410-1430 PT Time Calculation (min) (ACUTE ONLY): 20 min  Charges:  $Gait Training: 8-22 mins                    G CodesFabio Asa 07/21/15, 4:06 PM Charlotte Crumb, PT DPT  [phone]

## 2015-07-16 LAB — CBC
HCT: 32.2 % — ABNORMAL LOW (ref 36.0–46.0)
HEMATOCRIT: 32.7 % — AB (ref 36.0–46.0)
HEMOGLOBIN: 10.6 g/dL — AB (ref 12.0–15.0)
HEMOGLOBIN: 10.4 g/dL — AB (ref 12.0–15.0)
MCH: 32.1 pg (ref 26.0–34.0)
MCH: 32 pg (ref 26.0–34.0)
MCHC: 32.4 g/dL (ref 30.0–36.0)
MCHC: 32.3 g/dL (ref 30.0–36.0)
MCV: 99.1 fL (ref 78.0–100.0)
MCV: 99.1 fL (ref 78.0–100.0)
Platelets: 171 10*3/uL (ref 150–400)
Platelets: 177 10*3/uL (ref 150–400)
RBC: 3.3 MIL/uL — ABNORMAL LOW (ref 3.87–5.11)
RBC: 3.25 MIL/uL — AB (ref 3.87–5.11)
RDW: 13.9 % (ref 11.5–15.5)
RDW: 13.9 % (ref 11.5–15.5)
WBC: 12.5 10*3/uL — ABNORMAL HIGH (ref 4.0–10.5)
WBC: 13.2 10*3/uL — AB (ref 4.0–10.5)

## 2015-07-16 LAB — COMPREHENSIVE METABOLIC PANEL
ALK PHOS: 79 U/L (ref 38–126)
ALT: 101 U/L — AB (ref 14–54)
AST: 27 U/L (ref 15–41)
Albumin: 3.1 g/dL — ABNORMAL LOW (ref 3.5–5.0)
Anion gap: 12 (ref 5–15)
BILIRUBIN TOTAL: 0.6 mg/dL (ref 0.3–1.2)
BUN: 52 mg/dL — AB (ref 6–20)
CALCIUM: 8.5 mg/dL — AB (ref 8.9–10.3)
CHLORIDE: 98 mmol/L — AB (ref 101–111)
CO2: 26 mmol/L (ref 22–32)
CREATININE: 3.75 mg/dL — AB (ref 0.44–1.00)
GFR calc Af Amer: 12 mL/min — ABNORMAL LOW (ref >60)
GFR, EST NON AFRICAN AMERICAN: 11 mL/min — AB (ref >60)
Glucose, Bld: 109 mg/dL — ABNORMAL HIGH (ref 65–99)
Potassium: 4.1 mmol/L (ref 3.5–5.1)
Sodium: 136 mmol/L (ref 135–145)
TOTAL PROTEIN: 5.6 g/dL — AB (ref 6.5–8.1)

## 2015-07-16 LAB — RENAL FUNCTION PANEL
ANION GAP: 12 (ref 5–15)
Albumin: 3 g/dL — ABNORMAL LOW (ref 3.5–5.0)
BUN: 52 mg/dL — ABNORMAL HIGH (ref 6–20)
CALCIUM: 8.5 mg/dL — AB (ref 8.9–10.3)
CO2: 26 mmol/L (ref 22–32)
Chloride: 98 mmol/L — ABNORMAL LOW (ref 101–111)
Creatinine, Ser: 3.66 mg/dL — ABNORMAL HIGH (ref 0.44–1.00)
GFR calc non Af Amer: 11 mL/min — ABNORMAL LOW (ref >60)
GFR, EST AFRICAN AMERICAN: 13 mL/min — AB (ref >60)
Glucose, Bld: 104 mg/dL — ABNORMAL HIGH (ref 65–99)
Phosphorus: 4.1 mg/dL (ref 2.5–4.6)
Potassium: 4.2 mmol/L (ref 3.5–5.1)
SODIUM: 136 mmol/L (ref 135–145)

## 2015-07-16 LAB — IRON AND TIBC
IRON: 18 ug/dL — AB (ref 28–170)
SATURATION RATIOS: 6 % — AB (ref 10.4–31.8)
TIBC: 308 ug/dL (ref 250–450)
UIBC: 290 ug/dL

## 2015-07-16 LAB — PROTIME-INR
INR: 1.83 — ABNORMAL HIGH (ref 0.00–1.49)
Prothrombin Time: 21.1 seconds — ABNORMAL HIGH (ref 11.6–15.2)

## 2015-07-16 LAB — FERRITIN: FERRITIN: 85 ng/mL (ref 11–307)

## 2015-07-16 MED ORDER — HEPARIN SODIUM (PORCINE) 1000 UNIT/ML DIALYSIS: 20.0000 [IU]/kg | INTRAMUSCULAR | Status: DC | PRN

## 2015-07-16 MED ORDER — HEPARIN (PORCINE) IN NACL 100-0.45 UNIT/ML-% IJ SOLN
950.0000 [IU]/h | INTRAMUSCULAR | Status: AC
  Administered 2015-07-16: 950 [IU]/h via INTRAVENOUS
  Filled 2015-07-16: qty 250

## 2015-07-16 MED ORDER — SODIUM CHLORIDE 0.9% FLUSH: 10.0000 mL | INTRAVENOUS | Status: DC | PRN

## 2015-07-16 NOTE — Progress Notes (Signed)
[facility] PROGRESS NOTE  [name] MGQ:[id number] DOB: 11/10/1935 DOA: 07/03/2015 PCP: Michiel Sites, MD  brief narrative  80 year old female with history of alcohol abuse, coronary artery disease, peripheral vascular disease and seek ED stage III who was sent to the ED by her PCP with fever of 102F and ongoing cough. In the ED she had elevated BNP and troponin with new onset A. fib with RVR as well as elevated lactic acid. She was also found to have acute on chronic kidney disease along with transaminitis. Patient was admitted to the hospital with sepsis and acute kidney injury. Hospital course complicated with need for dialysis, bilateral renal artery stenosis and severe acute systolic CHF.  Assessment/Plan: Sepsis secondary to pneumonia resolved.. Now off antibiotics.  Acute on chronic kidney disease stage V Now requiring hemodialysis. Clipped to  outpatient dialysis center starting 07/21/2015. Should renal recommendations. HD  catheter to be placed by IR  possibly tomorrow once INR <1.5.   Acute systolic CHF with elevated troponin 2-D echo showing EF of 20-25%. Appears euvolemic with volume management with dialysis. Appreciate cardiology recommendations. Cardiac cath done on 2/20 showed showed patent grafts with no new lesions. Cardiology suggests this is nonischemic cardiomyopathy with underlying A. fib versus volume overload from renal insufficiency. -Continue metoprolol Added hydralazine.  New onset A. fib with RVR Hypertension and alcohol abuse may be contributing. Now in normal sinus rhythm after dialysis and addition of amiodarone. Currently on heparin drip. Started Coumadin ( holding for HD catheter placement tomorrow). Continue metoprolol for rate control.  Bilateral renal artery stenosis Renal artery angiograms showed no changes from prior study. A tent left renal artery stent with mild in-stent restenosis and proximal right renal artery stenosis is  stable.  CAD status post CABG in 2007   Transaminitis Possibly associated with alcoholic hepatitis. Counseled on cessation. Follow-up LFTs improving.   DVT prophylaxis: On IV heparin  Diet: Renal/cardiac  Code Status: Full code Family Communication: None at bedside Disposition Plan: Home possibly 2/26 ( HD on 2/25 , and is to catheter to be placed on 2/24. Needs INR to be therapeutic after Coumadin has been resumed).   Consultants:  Renal  Cardiology  IR  Procedures:  Left heart catheterization and renal angiogram on 2/20  Tunneled HD catheter placement possibly on 2/24)  Antibiotics:  None  HPI/Subjective: Seen and examined during dialysis. Denies any specific symptoms.  Objective: Filed Vitals:   07/16/15 1038 07/16/15 1111  BP: 142/61 152/55  Pulse: 57 61  Temp: 97.9 F (36.6 C) 98.2 F (36.8 C)  Resp: 18 17    Intake/Output Summary (Last 24 hours) at 07/16/15 1402 Last data filed at 07/16/15 1038  Gross per 24 hour  Intake    240 ml  Output   2000 ml  Net  -1760 ml   Filed Weights   07/15/15 0500 07/16/15 0651 07/16/15 1038  Weight: 72.8 kg (160 lb 7.9 oz) 73.5 kg (162 lb 0.6 oz) 71.4 kg (157 lb 6.5 oz)    Exam:   General:   not in distress  HEENT: Right IJ,  moist mucosa  Chest: Clear bilaterally  CVS: Normal S1 and S2, no murmurs or gallop  GI: Soft, nondistended, nontender  musculoskeletal: Warm, no edema  Data Reviewed: Basic Metabolic Panel:  Recent Labs Lab 07/10/15 0425 07/11/15 0500 07/12/15 0543 07/13/15 0730 07/14/15 0706 07/15/15 0832 07/16/15 0638 07/16/15 0733  NA 139 139 139 139 136 138 136 136  K 3.4* 3.7 4.2 4.3  4.2 4.4 4.1 4.2  CL 96* 97* 99* 100* 97* 97* 98* 98*  CO2 GLUCOSE 124* 98 108* 96 109* 95 109* 104*  BUN 90* 104* 45* 73* 86* 33* 52* 52*  CREATININE 2.99* 3.50* 2.61* 3.56* 4.23* 2.92* 3.75* 3.66*  CALCIUM 8.2* 8.8* 8.6* 8.7* 8.8* 8.5* 8.5* 8.5*  MG 2.2 2.1  --   --    --   --   --   --   PHOS 5.7* 5.2* 4.1 5.0* 5.9* 3.9  --  4.1   Liver Function Tests:  Recent Labs Lab 07/10/15 0425 07/11/15 0500  07/13/15 0730 07/14/15 0706 07/15/15 0832 07/16/15 0638 07/16/15 0733  AST 131* 70*  --   --   --   --  27  --   ALT 537* 382*  --   --   --   --  101*  --   ALKPHOS 95 96  --   --   --   --  79  --   BILITOT 1.0 1.2  --   --   --   --  0.6  --   PROT 5.7* 5.7*  --   --   --   --  5.6*  --   ALBUMIN 3.3* 3.3*  < > 3.1* 3.0* 2.9* 3.1* 3.0*  < > = values in this interval not displayed. No results for input(s): LIPASE, AMYLASE in the last 168 hours. No results for input(s): AMMONIA in the last 168 hours. CBC:  Recent Labs Lab 07/14/15 0622 07/14/15 0706 07/15/15 0832 07/16/15 0638 07/16/15 0735  WBC 12.0* 12.3* 13.7* 12.5* 13.2*  HGB 10.9* 10.3* 11.1* 10.6* 10.4*  HCT 35.3* 33.3* 34.7* 32.7* 32.2*  MCV 99.2 98.2 99.7 99.1 99.1  PLT 208 202 149* 171 177   Cardiac Enzymes: No results for input(s): CKTOTAL, CKMB, CKMBINDEX, TROPONINI in the last 168 hours. BNP (last 3 results)  Recent Labs  07/03/15 1236  BNP 4223.3*    ProBNP (last 3 results) No results for input(s): PROBNP in the last 8760 hours.  CBG:  Recent Labs Lab 07/13/15 0431  GLUCAP 99    Recent Results (from the past 240 hour(s))  Culture, blood (Routine X 2) w Reflex to ID Panel     Status: None   Collection Time: 07/07/15  8:38 AM  Result Value Ref Range Status   Specimen Description BLOOD RIGHT ARM  Final   Special Requests BOTTLES DRAWN AEROBIC AND ANAEROBIC 5CC  Final   Culture   Final    NO GROWTH 5 DAYS Performed at [facility]    Report Status 07/12/2015 FINAL  Final  Culture, blood (Routine X 2) w Reflex to ID Panel     Status: None   Collection Time: 07/07/15  8:47 AM  Result Value Ref Range Status   Specimen Description BLOOD RIGHT HAND  Final   Special Requests IN PEDIATRIC BOTTLE 3CC  Final   Culture   Final    NO GROWTH 5  DAYS Performed at [facility]    Report Status 07/12/2015 FINAL  Final  MRSA PCR Screening     Status: None   Collection Time: 07/08/15  7:54 AM  Result Value Ref Range Status   MRSA by PCR NEGATIVE NEGATIVE Final    Comment:        The GeneXpert MRSA Assay (FDA approved for NASAL specimens only), is one component of a comprehensive  MRSA colonization surveillance program. It is not intended to diagnose MRSA infection nor to guide or monitor treatment for MRSA infections.      Studies: No results found.  Scheduled Meds: . amiodarone  400 mg Oral BID  . antiseptic oral rinse  7 mL Mouth Rinse BID  . feeding supplement (NEPRO CARB STEADY)  237 mL Oral BID BM  . folic acid  1 mg Oral Daily  . hydrALAZINE  10 mg Oral 3 times per day  . metoprolol  75 mg Oral BID  . multivitamin with minerals  1 tablet Oral Daily  . pantoprazole  40 mg Oral Daily  . sodium chloride flush  3 mL Intravenous Q12H  . sodium chloride flush  3 mL Intravenous Q12H  . thiamine  100 mg Oral Daily  . warfarin   Does not apply Once   Continuous Infusions:      Time spent: 25 minutes    Eddie North  [facility] Pager [phone] If 7PM-7AM, please contact night-coverage at [email or web address], password [facility] 07/16/2015, 2:02 PM  LOS: 13 days

## 2015-07-16 NOTE — Progress Notes (Signed)
IR tentatively planned for tunneled HD today. Pt on heparin drip and Coumadin was initiated a few days ago, pt got 6mg  dose on 2/20 and 2/21. Has since been held for procedure, but INR continued to go up today, now 1.8. WBC stable around 12-13, pt afebrile  Need INR to be down near 1.5 for procedure. Resume diet today after HD and will make NPO p MN again tonight.  Brayton El PA-C Interventional Radiology 07/16/2015 10:58 AM

## 2015-07-16 NOTE — Procedures (Signed)
Patient was seen on dialysis and the procedure was supervised.  BFR 300  Via VC- BP is 136/65.   Patient appears to be tolerating treatment well  [name] A 07/16/2015

## 2015-07-16 NOTE — Progress Notes (Signed)
[facility] Progress Note   Subjective:  No new complaints- seen in HD- removed 2 liters so far- tolerated well    Filed Vitals:   07/16/15 0757 07/16/15 0827 07/16/15 0900 07/16/15 0930  BP: 134/66 146/68 142/80 151/59  Pulse: 55 58 55 58  Temp:      TempSrc:      Resp: Height:      Weight:      SpO2:       I/O last 3 completed shifts: In: 240 [P.O.:240] Out: 0     Exam: General: Well developed, well nourished WF in no acute distress.  Neck: RIJ HD cath in place (2/16) Lungs: Coarse crackles R ant chest and few R base Heart:Regular, S1 & S2 normal, 2/6 apical murmur but no rubs, or gallop.  Abdomen: Soft, non-tender, non-distended with normoactive bowel sounds.  Extremities:  No significant edema.  Neuro: Alert and oriented X 3.    Inpatient medications: . amiodarone  400 mg Oral BID  . antiseptic oral rinse  7 mL Mouth Rinse BID  . feeding supplement (NEPRO CARB STEADY)  237 mL Oral BID BM  . folic acid  1 mg Oral Daily  . hydrALAZINE  10 mg Oral 3 times per day  . metoprolol  75 mg Oral BID  . multivitamin with minerals  1 tablet Oral Daily  . pantoprazole  40 mg Oral Daily  . sodium chloride flush  3 mL Intravenous Q12H  . sodium chloride flush  3 mL Intravenous Q12H  . thiamine  100 mg Oral Daily  . warfarin   Does not apply Once   Infusions   Prn medicines sodium chloride, sodium chloride, acetaminophen **OR** acetaminophen, diphenhydrAMINE, heparin, levalbuterol, ondansetron **OR** ondansetron (ZOFRAN) IV, sodium chloride flush   Recent Labs Lab 07/14/15 0706 07/15/15 0832 07/16/15 0638 07/16/15 0733  NA 136 138 136 136  K 4.2 4.4 4.1 4.2  CL 97* 97* 98* 98*  CO2 GLUCOSE 109* 95 109* 104*  BUN 86* 33* 52* 52*  CREATININE 4.23* 2.92* 3.75* 3.66*  CALCIUM 8.8* 8.5* 8.5* 8.5*  PHOS 5.9* 3.9  --  4.1    Recent Labs Lab 07/10/15 0425 07/11/15 0500  07/15/15 0832 07/16/15 0638 07/16/15 0733  AST  131* 70*  --   --  27  --   ALT 537* 382*  --   --  101*  --   ALKPHOS 95 96  --   --  79  --   BILITOT 1.0 1.2  --   --  0.6  --   PROT 5.7* 5.7*  --   --  5.6*  --   ALBUMIN 3.3* 3.3*  < > 2.9* 3.1* 3.0*  < > = values in this interval not displayed.  Recent Labs Lab 07/15/15 0832 07/16/15 0638 07/16/15 0735  WBC 13.7* 12.5* 13.2*  HGB 11.1* 10.6* 10.4*  HCT 34.7* 32.7* 32.2*  MCV 99.7 99.1 99.1  PLT 149* 171 177   Background 80 y.o. year-old with hx of CAD/ CABG, PAD, HTN , HL and CKD. Followed by Dr. Briant Cedar for CKD with baseline creat -of 2, RAS (L RA stent and last duplex 2016 >60% right RAS)  She presented to ED with SOB and hypoxia.Hospital eval demonstrated new drop in EF to 20-25%, new onset Afib, PNA. With diuresis creatinine progressive rose to around 4.6, BUN in the 170's.  HD was initiated 2/16 via temp  cath placed that date with understanding that this could be permanent RF.   Assessment: 1. SOB/ cough/ hypoxemia - combination of PNA +  CHF. Very poor LV function (EF 20-25%), and new afib.  ATB's completed. Right heart cath confirmed high filling pressures, probable cardiorenal component to her renal failure and she is clinically improved with dialysis and UF  2. CKD4 (baseline creatinine around 2-2.5, known RAS,  -> now CKD/ESRD (AKI on CKD4) - worsened renal fx with diuresis for CHF, very low EF, new AF - cardiorenal component.  Decided with family on a trial of dialysis and she has done very well with HD 3 treatments in-has decided to continue at least in the short term.  Will look to convert her vascath to permcath in the next couple of days- [facility]- HD today then will need to stay to get HD on Saturday but after that from my standpoint could be discharged to start as OP next Tuesday   1. New AF - On amio,. Has spontaneously converted to SR. Still on IV heparin- converting to coumadin- on hold for vascath conversion to PC   2. CAD hx CABG with  severe dilated / CM EF 20-25% -cath without culprit lesion- medical management  3. HTN meds- metoprolol 4. Anemia- hgb over 10- no treatment needed yet - will check iron stores  5. Bones- phos 4.1 no binders - will check PTH      Sylvan Sookdeo A   07/16/2015, 10:12 AM

## 2015-07-16 NOTE — Progress Notes (Addendum)
ANTICOAGULATION CONSULT NOTE - Follow Up Consult  Pharmacy Consult for heparin + Coumadin Indication: atrial fibrillation  Allergies  Allergen Reactions  . Angiotensin Receptor Blockers   . Latex Swelling  . Statins Other (See Comments)    Diffuse cramping; has tried Lipitor, Crestor, Pravachol and simvastatin  . Benicar [Olmesartan] Itching and Rash    Patient Measurements: Height: 5\' 3"  (160 cm) Weight: 157 lb 6.5 oz (71.4 kg) (Standing Scale) IBW/kg (Calculated) : 52.4 Heparin Dosing Weight: 63 kg  Vital Signs: Temp: 98.2 F (36.8 C) (02/23 1111) Temp Source: Oral (02/23 1111) BP: 152/55 mmHg (02/23 1111) Pulse Rate: 61 (02/23 1111)  Labs:  Recent Labs  07/14/15 0622  07/14/15 1306 07/14/15 2055 07/15/15 0832 07/16/15 6629 07/16/15 0733 07/16/15 0735  HGB 10.9*  < >  --   --  11.1* 10.6*  --  10.4*  HCT 35.3*  < >  --   --  34.7* 32.7*  --  32.2*  PLT 208  < >  --   --  149* 171  --  177  LABPROT 17.8*  --   --   --  20.3* 21.1*  --   --   INR 1.46  --   --   --  1.73* 1.83*  --   --   HEPARINUNFRC  --   --  0.45 0.31 0.52  --   --   --   CREATININE  --   < >  --   --  2.92* 3.75* 3.66*  --   < > = values in this interval not displayed.  Estimated Creatinine Clearance: 12.2 mL/min (by C-G formula based on Cr of 3.66).   Assessment: On Heparin for r/o ACS and new onset afib, S/p RHC 2/16 and LHC 2/20. CHADS2VASC: 5.  INR 1.8- warfarin held last night in preparation for HD tunneled cath placement, but IR would like INR <1.5. Heparin was held starting at midnight, but now to resume since patient is not going to IR today.  Hgb 10.4, plts 177- no bleeding noted.  Goal of Therapy:  INR 2-3 Heparin level 0.3-0.7 units/ml Monitor platelets by anticoagulation protocol: Yes   Plan:  - Restart heparin at 950 units/hr- to be stopped at midnight in preparation for IR - Continue to hold warfarin - Daily INR, HL and CBC - Follow for s/s bleeding - Follow IR  plans  Sincere Liuzzi D. Raden Byington, PharmD, BCPS Clinical Pharmacist Pager: [phone] 07/16/2015 2:04 PM

## 2015-07-17 ENCOUNTER — Inpatient Hospital Stay (HOSPITAL_COMMUNITY): Payer: Medicare Other

## 2015-07-17 LAB — HEPARIN LEVEL (UNFRACTIONATED): Heparin Unfractionated: 0.12 IU/mL — ABNORMAL LOW (ref 0.30–0.70)

## 2015-07-17 LAB — CBC
HCT: 30.4 % — ABNORMAL LOW (ref 36.0–46.0)
Hemoglobin: 9.4 g/dL — ABNORMAL LOW (ref 12.0–15.0)
MCH: 30 pg (ref 26.0–34.0)
MCHC: 30.9 g/dL (ref 30.0–36.0)
MCV: 97.1 fL (ref 78.0–100.0)
PLATELETS: 117 10*3/uL — AB (ref 150–400)
RBC: 3.13 MIL/uL — AB (ref 3.87–5.11)
RDW: 14 % (ref 11.5–15.5)
WBC: 10.5 10*3/uL (ref 4.0–10.5)

## 2015-07-17 LAB — HEPATITIS B SURFACE ANTIBODY,QUALITATIVE: HEP B S AB: NONREACTIVE

## 2015-07-17 LAB — HEPATITIS B CORE ANTIBODY, TOTAL: HEP B C TOTAL AB: NEGATIVE

## 2015-07-17 LAB — PARATHYROID HORMONE, INTACT (NO CA): PTH: 193 pg/mL — ABNORMAL HIGH (ref 15–65)

## 2015-07-17 LAB — PROTIME-INR
INR: 1.52 — AB (ref 0.00–1.49)
Prothrombin Time: 18.3 seconds — ABNORMAL HIGH (ref 11.6–15.2)

## 2015-07-17 MED ORDER — WARFARIN SODIUM 7.5 MG PO TABS
7.5000 mg | ORAL_TABLET | Freq: Once | ORAL | Status: AC
  Administered 2015-07-17: 7.5 mg via ORAL
  Filled 2015-07-17: qty 1

## 2015-07-17 MED ORDER — LIDOCAINE HCL 1 % IJ SOLN
INTRAMUSCULAR | Status: AC
  Filled 2015-07-17: qty 20

## 2015-07-17 MED ORDER — NA FERRIC GLUC CPLX IN SUCROSE 12.5 MG/ML IV SOLN
125.0000 mg | Freq: Every day | INTRAVENOUS | Status: AC
  Administered 2015-07-17 – 2015-07-18 (×2): 125 mg via INTRAVENOUS
  Filled 2015-07-17 (×3): qty 10

## 2015-07-17 MED ORDER — CEFAZOLIN SODIUM-DEXTROSE 2-3 GM-% IV SOLR
INTRAVENOUS | Status: AC
Start: 2015-07-17 — End: 2015-07-18
  Filled 2015-07-17: qty 50

## 2015-07-17 MED ORDER — CEFAZOLIN SODIUM-DEXTROSE 2-3 GM-% IV SOLR
2.0000 g | INTRAVENOUS | Status: AC
  Administered 2015-07-17: 2 g via INTRAVENOUS

## 2015-07-17 MED ORDER — SODIUM CHLORIDE 0.9 % IV SOLN
INTRAVENOUS | Status: AC | PRN
  Administered 2015-07-17: 10 mL/h via INTRAVENOUS

## 2015-07-17 MED ORDER — MIDAZOLAM HCL 2 MG/2ML IJ SOLN
INTRAMUSCULAR | Status: AC
  Filled 2015-07-17: qty 4

## 2015-07-17 MED ORDER — MIDAZOLAM HCL 2 MG/2ML IJ SOLN
INTRAMUSCULAR | Status: AC | PRN
  Administered 2015-07-17: 1 mg via INTRAVENOUS
  Administered 2015-07-17 (×2): 0.5 mg via INTRAVENOUS

## 2015-07-17 MED ORDER — FENTANYL CITRATE (PF) 100 MCG/2ML IJ SOLN
INTRAMUSCULAR | Status: AC
  Filled 2015-07-17: qty 4

## 2015-07-17 MED ORDER — WARFARIN - PHARMACIST DOSING INPATIENT: Freq: Every day | Status: DC

## 2015-07-17 MED ORDER — FENTANYL CITRATE (PF) 100 MCG/2ML IJ SOLN
INTRAMUSCULAR | Status: AC | PRN
  Administered 2015-07-17: 50 ug via INTRAVENOUS
  Administered 2015-07-17 (×2): 25 ug via INTRAVENOUS

## 2015-07-17 MED ORDER — HEPARIN (PORCINE) IN NACL 100-0.45 UNIT/ML-% IJ SOLN
900.0000 [IU]/h | INTRAMUSCULAR | Status: DC
  Administered 2015-07-18: 1000 [IU]/h via INTRAVENOUS
  Filled 2015-07-17: qty 250

## 2015-07-17 MED ORDER — HEPARIN SODIUM (PORCINE) 1000 UNIT/ML IJ SOLN
INTRAMUSCULAR | Status: AC
  Filled 2015-07-17: qty 1

## 2015-07-17 NOTE — Care Management Important Message (Signed)
Important Message  Patient Details  Name: [name] MRN: [id number] Date of Birth: February 20, 1936   Medicare Important Message Given:  Yes    [name] [name] 07/17/2015, 1:14 PM

## 2015-07-17 NOTE — Progress Notes (Signed)
Physical Therapy Treatment Patient Details Name: [name] MRN: [id number] DOB: 1935-08-12 Today's Date: 2015-07-31    History of Present Illness 80 yo female admitted with acute on chronic renal failure, Afib. Hx of ETOH abuse    PT Comments    Patient progressing well with mobility, continues to demonstrate some instability during ambulation without device. Patient reports compliance with HEP program in her room yesterday. Will continue to see and progress as tolerated. VSS throughout activity.   Follow Up Recommendations  Home health PT;Supervision/Assistance - 24 hour     Equipment Recommendations  Rolling walker with 5" wheels    Recommendations for Other Services       Precautions / Restrictions Precautions Precautions: Fall Precaution Comments: monitor sats, HR Restrictions Weight Bearing Restrictions: No    Mobility  Bed Mobility               General bed mobility comments: up in chair family present  Transfers Overall transfer level: Needs assistance Equipment used: None Transfers: Sit to/from Stand Sit to Stand: Supervision         General transfer comment: no physical assist required  Ambulation/Gait Ambulation/Gait assistance: Min guard Ambulation Distance (Feet): 200 Feet Assistive device: None Gait Pattern/deviations: Step-through pattern;Decreased stride length Gait velocity: decreased Gait velocity interpretation: Below normal speed for age/gender General Gait Details: continues to demonstrates some isntability with ambulation, cues for increased cadence, patient receptive and noted improvements in ambulation with speed changes   Stairs            Wheelchair Mobility    Modified Rankin (Stroke Patients Only)       Balance   Sitting-balance support: No upper extremity supported Sitting balance-Leahy Scale: Good     Standing balance support: No upper extremity supported Standing balance-Leahy Scale: Fair                       Cognition Arousal/Alertness: Awake/alert (much improved compared to previous session) Behavior During Therapy: WFL for tasks assessed/performed Overall Cognitive Status: Within Functional Limits for tasks assessed                      Exercises      General Comments        Pertinent Vitals/Pain Pain Assessment: No/denies pain    Home Living                      Prior Function            PT Goals (current goals can now be found in the care plan section) Acute Rehab PT Goals Patient Stated Goal: to go home PT Goal Formulation: With patient/family Time For Goal Achievement: 07/19/15 Potential to Achieve Goals: Good Progress towards PT goals: Progressing toward goals    Frequency  Min 3X/week    PT Plan Current plan remains appropriate    Co-evaluation             End of Session Equipment Utilized During Treatment: Gait belt Activity Tolerance: Patient limited by fatigue Patient left: in chair;with call bell/phone within reach;with family/visitor present     Time: 0809-0822 PT Time Calculation (min) (ACUTE ONLY): 13 min  Charges:  $Gait Training: 8-22 mins                    G CodesFabio Asa 31-Jul-2015, 12:10 PM Charlotte Crumb, PT DPT  [phone]

## 2015-07-17 NOTE — Progress Notes (Signed)
ANTICOAGULATION CONSULT NOTE - Follow Up Consult  Pharmacy Consult for heparin Indication: atrial fibrillation  Allergies  Allergen Reactions  . Angiotensin Receptor Blockers   . Latex Swelling  . Statins Other (See Comments)    Diffuse cramping; has tried Lipitor, Crestor, Pravachol and simvastatin  . Benicar [Olmesartan] Itching and Rash    Patient Measurements: Height: 5\' 3"  (160 cm) Weight: 157 lb 6.5 oz (71.4 kg) (Standing Scale) IBW/kg (Calculated) : 52.4 Heparin Dosing Weight: 63 kg  Vital Signs: Temp: 98 F (36.7 C) (02/24 2220) Temp Source: Oral (02/24 2220) BP: 141/48 mmHg (02/24 2220) Pulse Rate: 58 (02/24 2220)  Labs:  Recent Labs  07/15/15 2841 07/16/15 3244 07/16/15 0733 07/16/15 0735 07/17/15 0437 07/17/15 0830 07/17/15 2207  HGB 11.1* 10.6*  --  10.4* 9.4*  --   --   HCT 34.7* 32.7*  --  32.2* 30.4*  --   --   PLT 149* 171  --  177 117*  --   --   LABPROT 20.3* 21.1*  --   --   --  18.3*  --   INR 1.73* 1.83*  --   --   --  1.52*  --   HEPARINUNFRC 0.52  --   --   --  <0.10*  --  0.12*  CREATININE 2.92* 3.75* 3.66*  --   --   --   --     Estimated Creatinine Clearance: 12.2 mL/min (by C-G formula based on Cr of 3.66).   Assessment: On Heparin for r/o ACS and new onset afib, S/p RHC 2/16 and LHC 2/20. CHADS2VASC: 5.  S/p HD cath placed 2/24 in IR - heparin bridge to coumadin restarted post-op. Heparin level subtherapeutic (0.12) on 950 units/hr. No issues with line or bleeding reported per RN.  Goal of Therapy:  INR 2-3 Heparin level 0.3-0.7 units/ml Monitor platelets by anticoagulation protocol: Yes   Plan:  - Increase heparin to 1100 units/hr - F/u 8 hr heparin level  Christoper Fabian, PharmD, BCPS Clinical pharmacist, pager [phone] 07/17/2015 11:32 PM

## 2015-07-17 NOTE — Progress Notes (Signed)
[facility] Progress Note   Subjective:  S/P PC today- HD yesterday removed 2000 tolerated well     Filed Vitals:   07/17/15 1245 07/17/15 1250 07/17/15 1255 07/17/15 1306  BP: 155/62 142/51 139/55 131/54  Pulse: 65 63 62 60  Temp:      TempSrc:      Resp: Height:      Weight:      SpO2: 97% 95% 96% 95%   I/O last 3 completed shifts: In: 480 [P.O.:480] Out: 2000 [Other:2000]    Exam: General: Well developed, well nourished WF in no acute distress.  Neck: RIJ HD cath in place (2/16) Lungs: Coarse crackles R ant chest and few R base Heart:Regular, S1 & S2 normal, 2/6 apical murmur but no rubs, or gallop.  Abdomen: Soft, non-tender, non-distended with normoactive bowel sounds.  Extremities:  No significant edema.  Neuro: Alert and oriented X 3.    Inpatient medications: . amiodarone  400 mg Oral BID  . antiseptic oral rinse  7 mL Mouth Rinse BID  . ceFAZolin      . feeding supplement (NEPRO CARB STEADY)  237 mL Oral BID BM  . fentaNYL      . folic acid  1 mg Oral Daily  . heparin      . hydrALAZINE  10 mg Oral 3 times per day  . lidocaine      . metoprolol  75 mg Oral BID  . midazolam      . multivitamin with minerals  1 tablet Oral Daily  . pantoprazole  40 mg Oral Daily  . sodium chloride flush  3 mL Intravenous Q12H  . sodium chloride flush  3 mL Intravenous Q12H  . thiamine  100 mg Oral Daily  . warfarin   Does not apply Once   Infusions   Prn medicines sodium chloride, sodium chloride, acetaminophen **OR** acetaminophen, diphenhydrAMINE, levalbuterol, ondansetron **OR** ondansetron (ZOFRAN) IV, sodium chloride flush, sodium chloride flush   Recent Labs Lab 07/14/15 0706 07/15/15 0832 07/16/15 0638 07/16/15 0733  NA 136 138 136 136  K 4.2 4.4 4.1 4.2  CL 97* 97* 98* 98*  CO2 GLUCOSE 109* 95 109* 104*  BUN 86* 33* 52* 52*  CREATININE 4.23* 2.92* 3.75* 3.66*  CALCIUM 8.8* 8.5* 8.5* 8.5*  PHOS 5.9* 3.9   --  4.1    Recent Labs Lab 07/11/15 0500  07/15/15 0832 07/16/15 0638 07/16/15 0733  AST 70*  --   --  27  --   ALT 382*  --   --  101*  --   ALKPHOS 96  --   --  79  --   BILITOT 1.2  --   --  0.6  --   PROT 5.7*  --   --  5.6*  --   ALBUMIN 3.3*  < > 2.9* 3.1* 3.0*  < > = values in this interval not displayed.  Recent Labs Lab 07/16/15 [id number] 07/16/15 0735 07/17/15 0437  WBC 12.5* 13.2* 10.5  HGB 10.6* 10.4* 9.4*  HCT 32.7* 32.2* 30.4*  MCV 99.1 99.1 97.1  PLT 171 177 117*   Background 80 y.o. year-old with hx of CAD/ CABG, PAD, HTN , HL and CKD. Followed by Dr. Briant Cedar for CKD with baseline creat -of 2, RAS (L RA stent and last duplex 2016 >60% right RAS)  She presented to ED with SOB and hypoxia.Hospital eval demonstrated new drop in  EF to 20-25%, new onset Afib, PNA. With diuresis creatinine progressive rose to around 4.6, BUN in the 170's.  HD was initiated 2/16 via temp cath placed that date with understanding that this could be permanent RF.   Assessment: 1. SOB/ cough/ hypoxemia - combination of PNA +  CHF. Very poor LV function (EF 20-25%), and new afib.  ATB's completed. Right heart cath confirmed high filling pressures, probable cardiorenal component to her renal failure and she is clinically improved with dialysis and UF  2. CKD4 (baseline creatinine around 2-2.5, known RAS,  -> now CKD/ESRD (AKI on CKD4) - worsened renal fx with diuresis for CHF, very low EF, new AF - cardiorenal component.  Decided with family on a trial of dialysis and she has done very well with HD 4 treatments in-has decided to continue at least in the short term.  Will look to convert her vascath to permcath- done today - [facility]- HD today then will need to stay to get HD tomorrow but after that from my standpoint could be discharged to start as OP next Tuesday   1. New AF - On amio,. Has spontaneously converted to SR. Still on IV heparin- converting to coumadin- on hold for  vascath conversion to [facility] - now can be resumed   2. CAD hx CABG with severe dilated / CM EF 20-25% -cath without culprit lesion- medical management  3. HTN meds- metoprolol 4. Anemia- hgb over 10- no treatment needed yet - iron stores low will replete  5. Bones- phos 4.1 no binders - will check PTH - was 193     Arturo Sofranko A   07/17/2015, 1:27 PM

## 2015-07-17 NOTE — Progress Notes (Signed)
[facility] PROGRESS NOTE  [name] ZOX:[id number] DOB: April 02, 1936 DOA: 07/03/2015 PCP: Michiel Sites, MD  brief narrative  80 year old female with history of alcohol abuse, coronary artery disease, peripheral vascular disease and seek ED stage III who was sent to the ED by her PCP with fever of 102F and ongoing cough. In the ED she had elevated BNP and troponin with new onset A. fib with RVR as well as elevated lactic acid. She was also found to have acute on chronic kidney disease along with transaminitis. Patient was admitted to the hospital with sepsis and acute kidney injury. Hospital course complicated with need for dialysis, bilateral renal artery stenosis and severe acute systolic CHF.  Assessment/Plan: Sepsis secondary to pneumonia resolved.. Now off antibiotics.  Acute on chronic kidney disease stage V Now requiring hemodialysis. Clipped to  outpatient dialysis center starting 07/21/2015. Should renal recommendations. HD  catheter placed by IR  .   Acute systolic CHF with elevated troponin 2-D echo showing EF of 20-25%. Appears euvolemic with volume management with dialysis. Appreciate cardiology recommendations. Cardiac cath done on 2/20 showed showed patent grafts with no new lesions. Cardiology suggests this is nonischemic cardiomyopathy with underlying A. fib versus volume overload from renal insufficiency. -Continue metoprolol . Added hydralazine.  New onset A. fib with RVR Hypertension and alcohol abuse may be contributing. Now in normal sinus rhythm after dialysis and addition of amiodarone. Started on coumadin with heparin bridge Follow up with cardiology as outpt. Have requested  for coumadin clinic follow up.  Bilateral renal artery stenosis Renal artery angiograms showed no changes from prior study. A patent left renal artery stent with mild in-stent restenosis and proximal right renal artery stenosis is stable.  CAD status post CABG in  2007   Transaminitis Possibly associated with alcoholic hepatitis. Counseled on cessation. Follow-up LFTs improving.   DVT prophylaxis: On IV heparin and coumadin Diet: Renal/cardiac  Code Status: Full code Family Communication: family at bedside Disposition Plan: Home possibly 2/26    Consultants:  Renal  Cardiology  IR  Procedures:  Left heart catheterization and renal angiogram on 2/20  Tunneled HD catheter placement possibly on 2/24)  Antibiotics:  None  HPI/Subjective: Seen and examined during dialysis. Denies any specific symptoms.  Objective: Filed Vitals:   07/17/15 1306 07/17/15 1336  BP: 131/54 139/80  Pulse: 60 61  Temp:  98.3 F (36.8 C)  Resp: 12 14    Intake/Output Summary (Last 24 hours) at 07/17/15 1404 Last data filed at 07/17/15 1054  Gross per 24 hour  Intake    240 ml  Output      0 ml  Net    240 ml   Filed Weights   07/15/15 0500 07/16/15 0651 07/16/15 1038  Weight: 72.8 kg (160 lb 7.9 oz) 73.5 kg (162 lb 0.6 oz) 71.4 kg (157 lb 6.5 oz)    Exam:   General:   not in distress  HEENT: Right IJ,  moist mucosa  Chest: Clear bilaterally, left sided HD catheter  CVS: Normal S1 and S2, no murmurs or gallop  GI: Soft, nondistended, nontender  musculoskeletal: Warm, no edema  Data Reviewed: Basic Metabolic Panel:  Recent Labs Lab 07/11/15 0500 07/12/15 0543 07/13/15 0730 07/14/15 0706 07/15/15 0832 07/16/15 0638 07/16/15 0733  NA 139 139 139 136 138 136 136  K 3.7 4.2 4.3 4.2 4.4 4.1 4.2  CL 97* 99* 100* 97* 97* 98* 98*  CO2 25 25 22 24 25 26  26  GLUCOSE 98 108* 96 109* 95 109* 104*  BUN 104* 45* 73* 86* 33* 52* 52*  CREATININE 3.50* 2.61* 3.56* 4.23* 2.92* 3.75* 3.66*  CALCIUM 8.8* 8.6* 8.7* 8.8* 8.5* 8.5* 8.5*  MG 2.1  --   --   --   --   --   --   PHOS 5.2* 4.1 5.0* 5.9* 3.9  --  4.1   Liver Function Tests:  Recent Labs Lab 07/11/15 0500  07/13/15 0730 07/14/15 0706 07/15/15 0832 07/16/15 0638  07/16/15 0733  AST 70*  --   --   --   --  27  --   ALT 382*  --   --   --   --  101*  --   ALKPHOS 96  --   --   --   --  79  --   BILITOT 1.2  --   --   --   --  0.6  --   PROT 5.7*  --   --   --   --  5.6*  --   ALBUMIN 3.3*  < > 3.1* 3.0* 2.9* 3.1* 3.0*  < > = values in this interval not displayed. No results for input(s): LIPASE, AMYLASE in the last 168 hours. No results for input(s): AMMONIA in the last 168 hours. CBC:  Recent Labs Lab 07/14/15 0706 07/15/15 0832 07/16/15 0638 07/16/15 0735 07/17/15 0437  WBC 12.3* 13.7* 12.5* 13.2* 10.5  HGB 10.3* 11.1* 10.6* 10.4* 9.4*  HCT 33.3* 34.7* 32.7* 32.2* 30.4*  MCV 98.2 99.7 99.1 99.1 97.1  PLT 202 149* 171 177 117*   Cardiac Enzymes: No results for input(s): CKTOTAL, CKMB, CKMBINDEX, TROPONINI in the last 168 hours. BNP (last 3 results)  Recent Labs  07/03/15 1236  BNP 4223.3*    ProBNP (last 3 results) No results for input(s): PROBNP in the last 8760 hours.  CBG:  Recent Labs Lab 07/13/15 0431  GLUCAP 99    Recent Results (from the past 240 hour(s))  MRSA PCR Screening     Status: None   Collection Time: 07/08/15  7:54 AM  Result Value Ref Range Status   MRSA by PCR NEGATIVE NEGATIVE Final    Comment:        The GeneXpert MRSA Assay (FDA approved for NASAL specimens only), is one component of a comprehensive MRSA colonization surveillance program. It is not intended to diagnose MRSA infection nor to guide or monitor treatment for MRSA infections.      Studies: Ir Fluoro Guide Cv Line Left  07/17/2015  INDICATION: Renal failure EXAM: TUNNELED DIALYSIS CATHETER PLACEMENT, ULTRASOUND GUIDANCE FOR VASCULAR ACCESS MEDICATIONS: ; The antibiotic was administered within an appropriate time interval prior to skin puncture. ANESTHESIA/SEDATION: Versed 2 mg IV; Fentanyl 100 mcg IV; Moderate Sedation Time:  20 The patient was continuously monitored during the procedure by the interventional radiology nurse  under my direct supervision. FLUOROSCOPY TIME:  Fluoroscopy Time: 0 minutes 42 seconds (2 mGy). COMPLICATIONS: None immediate. PROCEDURE: Informed written consent was obtained from the patient after a thorough discussion of the procedural risks, benefits and alternatives. All questions were addressed. Maximal Sterile Barrier Technique was utilized including caps, mask, sterile gowns, sterile gloves, sterile drape, hand hygiene and skin antiseptic. A timeout was performed prior to the initiation of the procedure. The left neck was prepped with ChloraPrep in a sterile fashion, and a sterile drape was applied covering the operative field. A sterile gown and sterile gloves were used for the  procedure. 1% lidocaine into the skin and subcutaneous tissue. The left internal jugular vein was noted to be patent initially with ultrasound. Under sonographic guidance, a micropuncture needle was inserted into the right IJ vein (Ultrasound and fluoroscopic image documentation was performed). It was removed over an 018 wire which was up-sized to an Amplatz. This was advanced into the IVC. A small incision was made in the left upper chest. The tunneling device was utilized to advance the 28 centimeter tip to cuff catheter from the chest incision and out the neck incision. A peel-away sheath was advanced over the Amplatz wire. The leading edge of the catheter was then advanced through the peel-away sheath. The peel-away sheath was removed. It was flushed and instilled with heparin. The chest incision was closed with a 0 Prolene pursestring stitch. The neck incision was closed with a 4-0 Vicryl subcuticular stitch. IMPRESSION: Successful left IJ vein tunneled dialysis catheter with its tip in the right atrium. Electronically Signed   By: Jolaine Click M.D.   On: 07/17/2015 13:39   Ir US Guide Vasc Access Left  07/17/2015  INDICATION: Renal failure EXAM: TUNNELED DIALYSIS CATHETER PLACEMENT, ULTRASOUND GUIDANCE FOR VASCULAR ACCESS  MEDICATIONS: ; The antibiotic was administered within an appropriate time interval prior to skin puncture. ANESTHESIA/SEDATION: Versed 2 mg IV; Fentanyl 100 mcg IV; Moderate Sedation Time:  20 The patient was continuously monitored during the procedure by the interventional radiology nurse under my direct supervision. FLUOROSCOPY TIME:  Fluoroscopy Time: 0 minutes 42 seconds (2 mGy). COMPLICATIONS: None immediate. PROCEDURE: Informed written consent was obtained from the patient after a thorough discussion of the procedural risks, benefits and alternatives. All questions were addressed. Maximal Sterile Barrier Technique was utilized including caps, mask, sterile gowns, sterile gloves, sterile drape, hand hygiene and skin antiseptic. A timeout was performed prior to the initiation of the procedure. The left neck was prepped with ChloraPrep in a sterile fashion, and a sterile drape was applied covering the operative field. A sterile gown and sterile gloves were used for the procedure. 1% lidocaine into the skin and subcutaneous tissue. The left internal jugular vein was noted to be patent initially with ultrasound. Under sonographic guidance, a micropuncture needle was inserted into the right IJ vein (Ultrasound and fluoroscopic image documentation was performed). It was removed over an 018 wire which was up-sized to an Amplatz. This was advanced into the IVC. A small incision was made in the left upper chest. The tunneling device was utilized to advance the 28 centimeter tip to cuff catheter from the chest incision and out the neck incision. A peel-away sheath was advanced over the Amplatz wire. The leading edge of the catheter was then advanced through the peel-away sheath. The peel-away sheath was removed. It was flushed and instilled with heparin. The chest incision was closed with a 0 Prolene pursestring stitch. The neck incision was closed with a 4-0 Vicryl subcuticular stitch. IMPRESSION: Successful left IJ  vein tunneled dialysis catheter with its tip in the right atrium. Electronically Signed   By: Jolaine Click M.D.   On: 07/17/2015 13:39    Scheduled Meds: . amiodarone  400 mg Oral BID  . antiseptic oral rinse  7 mL Mouth Rinse BID  . ceFAZolin      . feeding supplement (NEPRO CARB STEADY)  237 mL Oral BID BM  . fentaNYL      . ferric gluconate (FERRLECIT/NULECIT) IV  125 mg Intravenous Daily  . folic acid  1 mg Oral Daily  .  heparin      . hydrALAZINE  10 mg Oral 3 times per day  . lidocaine      . metoprolol  75 mg Oral BID  . midazolam      . multivitamin with minerals  1 tablet Oral Daily  . pantoprazole  40 mg Oral Daily  . sodium chloride flush  3 mL Intravenous Q12H  . sodium chloride flush  3 mL Intravenous Q12H  . thiamine  100 mg Oral Daily  . warfarin  7.5 mg Oral ONCE-1800  . warfarin   Does not apply Once  . Warfarin - Pharmacist Dosing Inpatient   Does not apply q1800   Continuous Infusions: . heparin        Time spent: 25 minutes    Artemisa Sladek  [facility] Pager [phone] If 7PM-7AM, please contact night-coverage at [email or web address], password [facility] 07/17/2015, 2:04 PM  LOS: 14 days

## 2015-07-17 NOTE — Procedures (Signed)
LIJV HD catheter 28 cm SVC RA No comp/EBL

## 2015-07-17 NOTE — Progress Notes (Signed)
ANTICOAGULATION CONSULT NOTE - Follow Up Consult  Pharmacy Consult for heparin + Coumadin Indication: atrial fibrillation  Allergies  Allergen Reactions  . Angiotensin Receptor Blockers   . Latex Swelling  . Statins Other (See Comments)    Diffuse cramping; has tried Lipitor, Crestor, Pravachol and simvastatin  . Benicar [Olmesartan] Itching and Rash    Patient Measurements: Height: 5\' 3"  (160 cm) Weight: 157 lb 6.5 oz (71.4 kg) (Standing Scale) IBW/kg (Calculated) : 52.4 Heparin Dosing Weight: 63 kg  Vital Signs: Temp: 98.3 F (36.8 C) (02/24 1336) Temp Source: Oral (02/24 1336) BP: 139/80 mmHg (02/24 1336) Pulse Rate: 61 (02/24 1336)  Labs:  Recent Labs  07/14/15 2055  07/15/15 8185 07/16/15 9093 07/16/15 0733 07/16/15 0735 07/17/15 0437 07/17/15 0830  HGB  --   < > 11.1* 10.6*  --  10.4* 9.4*  --   HCT  --   < > 34.7* 32.7*  --  32.2* 30.4*  --   PLT  --   < > 149* 171  --  177 117*  --   LABPROT  --   --  20.3* 21.1*  --   --   --  18.3*  INR  --   --  1.73* 1.83*  --   --   --  1.52*  HEPARINUNFRC 0.31  --  0.52  --   --   --  <0.10*  --   CREATININE  --   --  2.92* 3.75* 3.66*  --   --   --   < > = values in this interval not displayed.  Estimated Creatinine Clearance: 12.2 mL/min (by C-G formula based on Cr of 3.66).   Assessment: On Heparin for r/o ACS and new onset afib, S/p RHC 2/16 and LHC 2/20. CHADS2VASC: 5.  Heparin held starting at midnight, warfarin not given past 2 nights in preparation for HD cath placement. Completed today by IR, to restart heparin + warfarin.  INR 1.52. Hgb 9.4. Plts 117-drop- watch closely- baseline plts around 170-200, no bleeding noted.  Goal of Therapy:  INR 2-3 Heparin level 0.3-0.7 units/ml Monitor platelets by anticoagulation protocol: Yes   Plan:  - Restart heparin at 950 units/hr - Heparin level tonight at 2200 - Warfarin 7.5mg  po x1 tonight  - Daily INR, HL and CBC - Follow for s/s bleeding - Will  provide education prior to discharge  Chirsty Armistead D. Daine Gunther, PharmD, BCPS Clinical Pharmacist Pager: [phone] 07/17/2015 1:48 PM

## 2015-07-17 NOTE — Care Management Note (Signed)
Case Management Note  Patient Details  Name: [name] MRN: [id number] Date of Birth: 09/25/1935  Subjective/Objective:         CM following for progression and d/c planning.           Action/Plan: 07/17/2015 Met with pt and husband re [place] needs. Pt has walker, 3:1 and shower chair. Per notes and pt , her daughter was to talk to pt PCP , Dr Wilson Singer for his recommendation for [facility]. This CM attempted to contact pt daughter, Deloris Ping @ [phone] to assist with d/c planning. Received call back from pt daughter, Mrs. Denzil Hughes who states that she talked with Dr America Brown office and he did not have a preferrence for [facility]. Daughter selected [facility] for [facility] services, this CM notified [place].  Expected Discharge Date:   (unkown)               Expected Discharge Plan:  [place]  In-House Referral:     Discharge planning Services  CM Consult  Post Acute Care Choice:    Choice offered to:  Adult Children  DME Arranged:  N/A DME Agency:     HH Arranged:  RN, PT, OT HH Agency:     Status of Service:  In process, will continue to follow  Medicare Important Message Given:  Yes Date Medicare IM Given:    Medicare IM give by:    Date Additional Medicare IM Given:    Additional Medicare Important Message give by:     If discussed at [place] of Stay Meetings, dates discussed:    Additional Comments:  Adron Bene, RN 07/17/2015, 2:15 PM

## 2015-07-18 DIAGNOSIS — Z992 Dependence on renal dialysis: Secondary | ICD-10-CM

## 2015-07-18 DIAGNOSIS — N186 End stage renal disease: Secondary | ICD-10-CM

## 2015-07-18 LAB — CBC
HEMATOCRIT: 32.6 % — AB (ref 36.0–46.0)
HEMOGLOBIN: 10.1 g/dL — AB (ref 12.0–15.0)
MCH: 30.1 pg (ref 26.0–34.0)
MCHC: 31 g/dL (ref 30.0–36.0)
MCV: 97 fL (ref 78.0–100.0)
Platelets: 123 10*3/uL — ABNORMAL LOW (ref 150–400)
RBC: 3.36 MIL/uL — AB (ref 3.87–5.11)
RDW: 13.9 % (ref 11.5–15.5)
WBC: 11.7 10*3/uL — ABNORMAL HIGH (ref 4.0–10.5)

## 2015-07-18 LAB — HEPARIN LEVEL (UNFRACTIONATED)
HEPARIN UNFRACTIONATED: 0.82 [IU]/mL — AB (ref 0.30–0.70)
HEPARIN UNFRACTIONATED: 0.63 [IU]/mL (ref 0.30–0.70)
Heparin Unfractionated: 0.84 IU/mL — ABNORMAL HIGH (ref 0.30–0.70)

## 2015-07-18 LAB — RENAL FUNCTION PANEL
ALBUMIN: 2.9 g/dL — AB (ref 3.5–5.0)
ANION GAP: 14 (ref 5–15)
BUN: 44 mg/dL — ABNORMAL HIGH (ref 6–20)
CALCIUM: 8.5 mg/dL — AB (ref 8.9–10.3)
CO2: 23 mmol/L (ref 22–32)
Chloride: 100 mmol/L — ABNORMAL LOW (ref 101–111)
Creatinine, Ser: 4.04 mg/dL — ABNORMAL HIGH (ref 0.44–1.00)
GFR, EST AFRICAN AMERICAN: 11 mL/min — AB (ref >60)
GFR, EST NON AFRICAN AMERICAN: 10 mL/min — AB (ref >60)
GLUCOSE: 98 mg/dL (ref 65–99)
PHOSPHORUS: 4 mg/dL (ref 2.5–4.6)
Potassium: 4.3 mmol/L (ref 3.5–5.1)
SODIUM: 137 mmol/L (ref 135–145)

## 2015-07-18 LAB — PROTIME-INR
INR: 1.82 — ABNORMAL HIGH (ref 0.00–1.49)
Prothrombin Time: 21 seconds — ABNORMAL HIGH (ref 11.6–15.2)

## 2015-07-18 MED ORDER — WARFARIN SODIUM 7.5 MG PO TABS: 7.5000 mg | ORAL_TABLET | Freq: Once | ORAL | Status: DC

## 2015-07-18 MED ORDER — LORAZEPAM 0.5 MG PO TABS
0.5000 mg | ORAL_TABLET | Freq: Once | ORAL | Status: AC
  Administered 2015-07-18: 0.5 mg via ORAL
  Filled 2015-07-18: qty 1

## 2015-07-18 MED ORDER — WARFARIN SODIUM 7.5 MG PO TABS
7.5000 mg | ORAL_TABLET | Freq: Once | ORAL | Status: AC
Start: 2015-07-18 — End: 2015-07-18
  Administered 2015-07-18: 7.5 mg via ORAL
  Filled 2015-07-18: qty 1

## 2015-07-18 MED ORDER — HEPARIN SODIUM (PORCINE) 1000 UNIT/ML DIALYSIS: 20.0000 [IU]/kg | INTRAMUSCULAR | Status: DC | PRN

## 2015-07-18 NOTE — Progress Notes (Signed)
ANTICOAGULATION CONSULT NOTE - Follow Up Consult  Pharmacy Consult for heparin + Coumadin Indication: atrial fibrillation  Allergies  Allergen Reactions  . Angiotensin Receptor Blockers   . Latex Swelling  . Statins Other (See Comments)    Diffuse cramping; has tried Lipitor, Crestor, Pravachol and simvastatin  . Benicar [Olmesartan] Itching and Rash    Patient Measurements: Height: 5\' 3"  (160 cm) Weight: 160 lb 15 oz (73 kg) IBW/kg (Calculated) : 52.4 Heparin Dosing Weight: 63 kg  Vital Signs: Temp: 98.7 F (37.1 C) (02/25 1831) Temp Source: Oral (02/25 1831) BP: 136/46 mmHg (02/25 1831) Pulse Rate: 60 (02/25 1831)  Labs:  Recent Labs  07/16/15 3785 07/16/15 8850 07/16/15 0735 07/17/15 0437 07/17/15 0830  07/18/15 0527 07/18/15 0706 07/18/15 1447 07/18/15 1959  HGB 10.6*  --  10.4* 9.4*  --   --  10.1*  --   --   --   HCT 32.7*  --  32.2* 30.4*  --   --  32.6*  --   --   --   PLT 171  --  177 117*  --   --  123*  --   --   --   LABPROT 21.1*  --   --   --  18.3*  --  21.0*  --   --   --   INR 1.83*  --   --   --  1.52*  --  1.82*  --   --   --   HEPARINUNFRC  --   --   --  <0.10*  --   < >  --  0.84* 0.82* 0.63  CREATININE 3.75* 3.66*  --   --   --   --   --  4.04*  --   --   < > = values in this interval not displayed.  Estimated Creatinine Clearance: 11.2 mL/min (by C-G formula based on Cr of 4.04).   Assessment: On Heparin for r/o ACS and new onset afib, S/p RHC 2/16 and LHC 2/20. CHADS2VASC: 5. Warfarin was previously help for HD cath which was completed on 2/24. Now to continue heparin + warfarin.  INR 1.82. Hgb 10.1. Plts 123 which is increasing slightly after drop, baseline plts around 170-200, no bleeding noted.  HL now therapeutic x1 (0.63) on 900 units/h. No iv line/bleed issues noted.   Goal of Therapy:  INR 2-3 Heparin level 0.3-0.7 units/ml Monitor platelets by anticoagulation protocol: Yes   Plan:  - Continue heparin IV at 900  units/hr - 8h HL to confirm - Warfarin 7.5mg  po x1 tonight  - Daily INR, HL and CBC - Follow for s/s bleeding - Will provide education prior to discharge   Babs Bertin, PharmD, [facility] Clinical Pharmacist Pager [phone] 07/18/2015 9:30 PM

## 2015-07-18 NOTE — Progress Notes (Signed)
[facility] PROGRESS NOTE  EMANII BUGBEE ZOX:[id number] DOB: [birth year] DOA: 07/03/2015 PCP: Michiel Sites, MD  brief narrative  80 year old female with history of alcohol abuse, coronary artery disease, peripheral vascular disease and seek ED stage III who was sent to the ED by her PCP with fever of 102F and ongoing cough. In the ED she had elevated BNP and troponin with new onset A. fib with RVR as well as elevated lactic acid. She was also found to have acute on chronic kidney disease along with transaminitis. Patient was admitted to the hospital with sepsis and acute kidney injury. Hospital course complicated with need for dialysis, bilateral renal artery stenosis and severe acute systolic CHF.  Assessment/Plan: Sepsis secondary to pneumonia resolved.. Now off antibiotics.  Acute on chronic kidney disease stage V Now requiring hemodialysis. Clipped to  outpatient dialysis center starting 07/21/2015. Should renal recommendations. HD  catheter placed by IR  On 2/24.   Acute systolic CHF with elevated troponin 2-D echo showing EF of 20-25%. Appears euvolemic with volume management with dialysis. Appreciate cardiology recommendations. Cardiac cath done on 2/20 showed showed patent grafts with no new lesions. Cardiology suggests this is nonischemic cardiomyopathy with underlying A. fib versus volume overload from renal insufficiency. -Continue metoprolol . Added hydralazine.  New onset A. fib with RVR Hypertension and alcohol abuse may be contributing. Now in normal sinus rhythm after dialysis and addition of amiodarone. Started on coumadin with heparin bridge. INR subtherapeutic. Follow up with cardiology as outpt. Have requested  for coumadin clinic follow up.  Bilateral renal artery stenosis Renal artery angiograms showed no changes from prior study. A patent left renal artery stent with mild in-stent restenosis and proximal right renal artery stenosis is  stable.  CAD status post CABG in 2007   Transaminitis Possibly associated with alcoholic hepatitis. Counseled on cessation. Follow-up LFTs improving.   DVT prophylaxis: On IV heparin and coumadin Diet: Renal/cardiac  Code Status: Full code Family Communication: family at bedside Disposition Plan: Home possibly tomorrow if INR therapeutic on Coumadin.   Consultants:  Renal  Cardiology  IR  Procedures:  Left heart catheterization and renal angiogram on 2/20  Tunneled HD catheter placement possibly on 2/24)  Antibiotics:  None  HPI/Subjective: Seen and examined during dialysis. No overnight issues  Objective: Filed Vitals:   07/18/15 1100 07/18/15 1130  BP: 134/48 139/46  Pulse: 62 60  Temp:    Resp:      Intake/Output Summary (Last 24 hours) at 07/18/15 1152 Last data filed at 07/17/15 2221  Gross per 24 hour  Intake   1055 ml  Output      0 ml  Net   1055 ml   Filed Weights   07/16/15 0651 07/16/15 1038 07/18/15 0749  Weight: 73.5 kg (162 lb 0.6 oz) 71.4 kg (157 lb 6.5 oz) 73 kg (160 lb 15 oz)    Exam:   General:   not in distress  HEENT: Moist mucosa  Chest: Clear bilaterally, left sided HD catheter  CVS: S1 &S2 irregular  GI: Soft, nondistended, nontender  musculoskeletal: Warm, no edema  Data Reviewed: Basic Metabolic Panel:  Recent Labs Lab 07/13/15 0730 07/14/15 0706 07/15/15 0832 07/16/15 0638 07/16/15 0733 07/18/15 0706  NA 139 136 138 136 136 137  K 4.3 4.2 4.4 4.1 4.2 4.3  CL 100* 97* 97* 98* 98* 100*  CO2 22 24 25 26 26 23   GLUCOSE 96 109* 95 109* 104* 98  BUN 73* 86* 33* 52*  52* 44*  CREATININE 3.56* 4.23* 2.92* 3.75* 3.66* 4.04*  CALCIUM 8.7* 8.8* 8.5* 8.5* 8.5* 8.5*  PHOS 5.0* 5.9* 3.9  --  4.1 4.0   Liver Function Tests:  Recent Labs Lab 07/14/15 0706 07/15/15 0832 07/16/15 0638 07/16/15 0733 07/18/15 0706  AST  --   --  27  --   --   ALT  --   --  101*  --   --   ALKPHOS  --   --  79  --   --    BILITOT  --   --  0.6  --   --   PROT  --   --  5.6*  --   --   ALBUMIN 3.0* 2.9* 3.1* 3.0* 2.9*   No results for input(s): LIPASE, AMYLASE in the last 168 hours. No results for input(s): AMMONIA in the last 168 hours. CBC:  Recent Labs Lab 07/15/15 0832 07/16/15 8119 07/16/15 0735 07/17/15 0437 07/18/15 0527  WBC 13.7* 12.5* 13.2* 10.5 11.7*  HGB 11.1* 10.6* 10.4* 9.4* 10.1*  HCT 34.7* 32.7* 32.2* 30.4* 32.6*  MCV 99.7 99.1 99.1 97.1 97.0  PLT 149* 171 177 117* 123*   Cardiac Enzymes: No results for input(s): CKTOTAL, CKMB, CKMBINDEX, TROPONINI in the last 168 hours. BNP (last 3 results)  Recent Labs  07/03/15 1236  BNP 4223.3*    ProBNP (last 3 results) No results for input(s): PROBNP in the last 8760 hours.  CBG:  Recent Labs Lab 07/13/15 0431  GLUCAP 99    No results found for this or any previous visit (from the past 240 hour(s)).   Studies: Ir Fluoro Guide Cv Line Left  07/17/2015  INDICATION: Renal failure EXAM: TUNNELED DIALYSIS CATHETER PLACEMENT, ULTRASOUND GUIDANCE FOR VASCULAR ACCESS MEDICATIONS: ; The antibiotic was administered within an appropriate time interval prior to skin puncture. ANESTHESIA/SEDATION: Versed 2 mg IV; Fentanyl 100 mcg IV; Moderate Sedation Time:  20 The patient was continuously monitored during the procedure by the interventional radiology nurse under my direct supervision. FLUOROSCOPY TIME:  Fluoroscopy Time: 0 minutes 42 seconds (2 mGy). COMPLICATIONS: None immediate. PROCEDURE: Informed written consent was obtained from the patient after a thorough discussion of the procedural risks, benefits and alternatives. All questions were addressed. Maximal Sterile Barrier Technique was utilized including caps, mask, sterile gowns, sterile gloves, sterile drape, hand hygiene and skin antiseptic. A timeout was performed prior to the initiation of the procedure. The left neck was prepped with ChloraPrep in a sterile fashion, and a sterile  drape was applied covering the operative field. A sterile gown and sterile gloves were used for the procedure. 1% lidocaine into the skin and subcutaneous tissue. The left internal jugular vein was noted to be patent initially with ultrasound. Under sonographic guidance, a micropuncture needle was inserted into the right IJ vein (Ultrasound and fluoroscopic image documentation was performed). It was removed over an 018 wire which was up-sized to an Amplatz. This was advanced into the IVC. A small incision was made in the left upper chest. The tunneling device was utilized to advance the 28 centimeter tip to cuff catheter from the chest incision and out the neck incision. A peel-away sheath was advanced over the Amplatz wire. The leading edge of the catheter was then advanced through the peel-away sheath. The peel-away sheath was removed. It was flushed and instilled with heparin. The chest incision was closed with a 0 Prolene pursestring stitch. The neck incision was closed with a 4-0 Vicryl subcuticular stitch.  IMPRESSION: Successful left IJ vein tunneled dialysis catheter with its tip in the right atrium. Electronically Signed   By: Jolaine Click M.D.   On: 07/17/2015 13:39   Ir US Guide Vasc Access Left  07/17/2015  INDICATION: Renal failure EXAM: TUNNELED DIALYSIS CATHETER PLACEMENT, ULTRASOUND GUIDANCE FOR VASCULAR ACCESS MEDICATIONS: ; The antibiotic was administered within an appropriate time interval prior to skin puncture. ANESTHESIA/SEDATION: Versed 2 mg IV; Fentanyl 100 mcg IV; Moderate Sedation Time:  20 The patient was continuously monitored during the procedure by the interventional radiology nurse under my direct supervision. FLUOROSCOPY TIME:  Fluoroscopy Time: 0 minutes 42 seconds (2 mGy). COMPLICATIONS: None immediate. PROCEDURE: Informed written consent was obtained from the patient after a thorough discussion of the procedural risks, benefits and alternatives. All questions were addressed.  Maximal Sterile Barrier Technique was utilized including caps, mask, sterile gowns, sterile gloves, sterile drape, hand hygiene and skin antiseptic. A timeout was performed prior to the initiation of the procedure. The left neck was prepped with ChloraPrep in a sterile fashion, and a sterile drape was applied covering the operative field. A sterile gown and sterile gloves were used for the procedure. 1% lidocaine into the skin and subcutaneous tissue. The left internal jugular vein was noted to be patent initially with ultrasound. Under sonographic guidance, a micropuncture needle was inserted into the right IJ vein (Ultrasound and fluoroscopic image documentation was performed). It was removed over an 018 wire which was up-sized to an Amplatz. This was advanced into the IVC. A small incision was made in the left upper chest. The tunneling device was utilized to advance the 28 centimeter tip to cuff catheter from the chest incision and out the neck incision. A peel-away sheath was advanced over the Amplatz wire. The leading edge of the catheter was then advanced through the peel-away sheath. The peel-away sheath was removed. It was flushed and instilled with heparin. The chest incision was closed with a 0 Prolene pursestring stitch. The neck incision was closed with a 4-0 Vicryl subcuticular stitch. IMPRESSION: Successful left IJ vein tunneled dialysis catheter with its tip in the right atrium. Electronically Signed   By: Jolaine Click M.D.   On: 07/17/2015 13:39    Scheduled Meds: . amiodarone  400 mg Oral BID  . antiseptic oral rinse  7 mL Mouth Rinse BID  . feeding supplement (NEPRO CARB STEADY)  237 mL Oral BID BM  . ferric gluconate (FERRLECIT/NULECIT) IV  125 mg Intravenous Daily  . folic acid  1 mg Oral Daily  . hydrALAZINE  10 mg Oral 3 times per day  . metoprolol  75 mg Oral BID  . multivitamin with minerals  1 tablet Oral Daily  . pantoprazole  40 mg Oral Daily  . sodium chloride flush  3 mL  Intravenous Q12H  . sodium chloride flush  3 mL Intravenous Q12H  . thiamine  100 mg Oral Daily  . warfarin  7.5 mg Oral ONCE-1800  . warfarin   Does not apply Once  . Warfarin - Pharmacist Dosing Inpatient   Does not apply q1800   Continuous Infusions: . heparin 1,100 Units/hr (07/17/15 2342)      Time spent: 20 minutes    Tatijana Bierly  [facility] Pager [phone] If 7PM-7AM, please contact night-coverage at [email or web address], password [facility] 07/18/2015, 11:52 AM  LOS: 15 days

## 2015-07-18 NOTE — Progress Notes (Signed)
Notified md about pt's request for ativan at HS.  Also needed clarification of parameters for medications.  New orders received. Will continue to monitor. Karena Addison T

## 2015-07-18 NOTE — Progress Notes (Addendum)
ANTICOAGULATION CONSULT NOTE - Follow Up Consult  Pharmacy Consult for heparin + Coumadin Indication: atrial fibrillation  Allergies  Allergen Reactions  . Angiotensin Receptor Blockers   . Latex Swelling  . Statins Other (See Comments)    Diffuse cramping; has tried Lipitor, Crestor, Pravachol and simvastatin  . Benicar [Olmesartan] Itching and Rash    Patient Measurements: Height: 5\' 3"  (160 cm) Weight: 157 lb 6.5 oz (71.4 kg) (Standing Scale) IBW/kg (Calculated) : 52.4 Heparin Dosing Weight: 63 kg  Vital Signs: Temp: 98 F (36.7 C) (02/24 2220) Temp Source: Oral (02/24 2220) BP: 158/62 mmHg (02/25 0536) Pulse Rate: 57 (02/25 0536)  Labs:  Recent Labs  07/16/15 9987 07/16/15 2158 07/16/15 0735 07/17/15 0437 07/17/15 0830 07/17/15 2207 07/18/15 0527 07/18/15 0706  HGB 10.6*  --  10.4* 9.4*  --   --  10.1*  --   HCT 32.7*  --  32.2* 30.4*  --   --  32.6*  --   PLT 171  --  177 117*  --   --  123*  --   LABPROT 21.1*  --   --   --  18.3*  --  21.0*  --   INR 1.83*  --   --   --  1.52*  --  1.82*  --   HEPARINUNFRC  --   --   --  <0.10*  --  0.12*  --  0.84*  CREATININE 3.75* 3.66*  --   --   --   --   --  4.04*    Estimated Creatinine Clearance: 11 mL/min (by C-G formula based on Cr of 4.04).   Assessment: On Heparin for r/o ACS and new onset afib, S/p RHC 2/16 and LHC 2/20. CHADS2VASC: 5. Warfarin was previously help for HD cath which was completed on 2/24. Now to continue heparin + warfarin.  HL 0.84 supratherapeutic. Patient previously therapeutic on 950 units/hr although rate was recently increased to 1100 units/hr due to subtherapeutic level. INR 1.82. Hgb 10.1. Plts 123 which is increasing slightly after drop, baseline plts around 170-200, no bleeding noted.  Goal of Therapy:  INR 2-3 Heparin level 0.3-0.7 units/ml Monitor platelets by anticoagulation protocol: Yes   Plan:  - Decrease heparin to 1000 units/hr - Heparin level at 1400 - Warfarin  7.5mg  po x1 tonight  - Daily INR, HL and CBC - Follow for s/s bleeding - Will provide education prior to discharge   Sherron Monday, PharmD Clinical Pharmacy Resident Pager: [phone] 07/18/2015 8:41 AM    Heparin level still supra-therapeutic at 0.82. Will decrease heparin rate to 900 units/hr and repeat heparin level in 6 hours for further adjustment.

## 2015-07-18 NOTE — Progress Notes (Signed)
[facility] Progress Note   Subjective:  Seen on HD this AM- looks good     Filed Vitals:   07/17/15 1336 07/17/15 1755 07/17/15 2220 07/18/15 0536  BP: 139/80 125/60 141/48 158/62  Pulse: 61 62 58 57  Temp: 98.3 F (36.8 C) 98.6 F (37 C) 98 F (36.7 C)   TempSrc: Oral Oral Oral   Resp: Height:      Weight:      SpO2: 98% 100% 98% 100%   I/O last 3 completed shifts: In: 1055 [P.O.:942; I.V.:3; IV Piggyback:110] Out: 0     Exam: General: Well developed, well nourished WF in no acute distress.  Neck: RIJ HD cath in place (2/16) Lungs: Coarse crackles R ant chest and few R base Heart:Regular, S1 & S2 normal, 2/6 apical murmur but no rubs, or gallop.  Abdomen: Soft, non-tender, non-distended with normoactive bowel sounds.  Extremities:  No significant edema.  Neuro: Alert and oriented X 3.    Inpatient medications: . amiodarone  400 mg Oral BID  . antiseptic oral rinse  7 mL Mouth Rinse BID  . feeding supplement (NEPRO CARB STEADY)  237 mL Oral BID BM  . ferric gluconate (FERRLECIT/NULECIT) IV  125 mg Intravenous Daily  . folic acid  1 mg Oral Daily  . hydrALAZINE  10 mg Oral 3 times per day  . metoprolol  75 mg Oral BID  . multivitamin with minerals  1 tablet Oral Daily  . pantoprazole  40 mg Oral Daily  . sodium chloride flush  3 mL Intravenous Q12H  . sodium chloride flush  3 mL Intravenous Q12H  . thiamine  100 mg Oral Daily  . warfarin  7.5 mg Oral ONCE-1800  . warfarin   Does not apply Once  . Warfarin - Pharmacist Dosing Inpatient   Does not apply q1800   Infusions . heparin 1,100 Units/hr (07/17/15 2342)   Prn medicines sodium chloride, sodium chloride, acetaminophen **OR** acetaminophen, diphenhydrAMINE, heparin, levalbuterol, ondansetron **OR** ondansetron (ZOFRAN) IV, sodium chloride flush, sodium chloride flush   Recent Labs Lab 07/15/15 0832 07/16/15 0638 07/16/15 0733 07/18/15 0706  NA 138 136 136 137  K 4.4  4.1 4.2 4.3  CL 97* 98* 98* 100*  CO2 GLUCOSE 95 109* 104* 98  BUN 33* 52* 52* 44*  CREATININE 2.92* 3.75* 3.66* 4.04*  CALCIUM 8.5* 8.5* 8.5* 8.5*  PHOS 3.9  --  4.1 4.0    Recent Labs Lab 07/16/15 0638 07/16/15 0733 07/18/15 0706  AST 27  --   --   ALT 101*  --   --   ALKPHOS 79  --   --   BILITOT 0.6  --   --   PROT 5.6*  --   --   ALBUMIN 3.1* 3.0* 2.9*    Recent Labs Lab 07/16/15 0735 07/17/15 0437 07/18/15 0527  WBC 13.2* 10.5 11.7*  HGB 10.4* 9.4* 10.1*  HCT 32.2* 30.4* 32.6*  MCV 99.1 97.1 97.0  PLT 177 117* 123*   Background 80 y.o. year-old with hx of CAD/ CABG, PAD, HTN , HL and CKD. Followed by Dr. Briant Cedar for CKD with baseline creat -of 2, RAS (L RA stent and last duplex 2016 >60% right RAS)  She presented to ED with SOB and hypoxia.Hospital eval demonstrated new drop in EF to 20-25%, new onset Afib, PNA. With diuresis creatinine progressive rose to around 4.6, BUN in the 170's.  HD  was initiated 2/16 via temp cath placed that date with understanding that this could be permanent RF.   Assessment: 1. SOB/ cough/ hypoxemia - combination of PNA +  CHF. Very poor LV function (EF 20-25%), and new afib.  ATB's completed. Right heart cath confirmed high filling pressures, probable cardiorenal component to her renal failure and she is clinically improved with dialysis and UF  2. CKD4 (baseline creatinine around 2-2.5, known RAS,  -> now CKD/ESRD (AKI on CKD4) - worsened renal fx with diuresis for CHF, very low EF, new AF - cardiorenal component.  Decided with family on a trial of dialysis and she has done very well with HD 4 treatments in-has decided to continue at least in the short term.  Will look to convert her vascath to permcath- done Friday - [facility]- HD today  but after that from my standpoint could be discharged to start as OP next Tuesday   1. New AF - On amio,. Has spontaneously converted to SR. Still on IV heparin-  converting to coumadin- on hold for vascath conversion to [facility] - now can be resumed   2. CAD hx CABG with severe dilated / CM EF 20-25% -cath without culprit lesion- medical management  3. HTN meds- metoprolol 4. Anemia- hgb over 10- no treatment needed yet - iron stores low will replete  5. Bones- phos 4.0 no binders - will check PTH - was 193     Fujiko Picazo A   07/18/2015, 8:44 AM

## 2015-07-19 DIAGNOSIS — N185 Chronic kidney disease, stage 5: Secondary | ICD-10-CM

## 2015-07-19 DIAGNOSIS — N186 End stage renal disease: Secondary | ICD-10-CM | POA: Insufficient documentation

## 2015-07-19 DIAGNOSIS — Z992 Dependence on renal dialysis: Secondary | ICD-10-CM

## 2015-07-19 HISTORY — DX: End stage renal disease: N18.6

## 2015-07-19 LAB — CBC
HCT: 31.8 % — ABNORMAL LOW (ref 36.0–46.0)
Hemoglobin: 9.7 g/dL — ABNORMAL LOW (ref 12.0–15.0)
MCH: 29.8 pg (ref 26.0–34.0)
MCHC: 30.5 g/dL (ref 30.0–36.0)
MCV: 97.5 fL (ref 78.0–100.0)
Platelets: 99 10*3/uL — ABNORMAL LOW (ref 150–400)
RBC: 3.26 MIL/uL — AB (ref 3.87–5.11)
RDW: 13.8 % (ref 11.5–15.5)
WBC: 8.7 10*3/uL (ref 4.0–10.5)

## 2015-07-19 LAB — HEPARIN LEVEL (UNFRACTIONATED): HEPARIN UNFRACTIONATED: 0.64 [IU]/mL (ref 0.30–0.70)

## 2015-07-19 LAB — PROTIME-INR
INR: 2.88 — AB (ref 0.00–1.49)
PROTHROMBIN TIME: 29.6 s — AB (ref 11.6–15.2)

## 2015-07-19 MED ORDER — WARFARIN SODIUM 5 MG PO TABS: 5.0000 mg | ORAL_TABLET | Freq: Every day | ORAL | Status: DC

## 2015-07-19 MED ORDER — NEPRO/CARBSTEADY PO LIQD: 237.0000 mL | Freq: Two times a day (BID) | ORAL | Status: DC

## 2015-07-19 MED ORDER — METOPROLOL TARTRATE 50 MG PO TABS: 75.0000 mg | ORAL_TABLET | Freq: Two times a day (BID) | ORAL | Status: DC

## 2015-07-19 MED ORDER — HYDRALAZINE HCL 10 MG PO TABS: 10.0000 mg | ORAL_TABLET | Freq: Three times a day (TID) | ORAL | Status: DC

## 2015-07-19 MED ORDER — AMIODARONE HCL 400 MG PO TABS: 400.0000 mg | ORAL_TABLET | Freq: Two times a day (BID) | ORAL | Status: DC

## 2015-07-19 NOTE — Progress Notes (Signed)
Pt should be for discharge today- is set up to start OP HD at the [facility] this coming Tuesday.  Renal will sign off- please call us if patient will need to remain an inpatient until Tuesday so that she can get her HD here.   Delia Sitar A

## 2015-07-19 NOTE — Progress Notes (Signed)
CM confirmed [facility] HHPT/OT/RN with pt and called [facility] rep, Tiffany to notify of discharge.  No other CM needs were communicated.

## 2015-07-19 NOTE — Discharge Summary (Addendum)
Physician Discharge Summary  [name] ZOX:[id number] DOB: 26-May-1935 DOA: 07/03/2015  PCP: Michiel Sites, MD  Admit date: 07/03/2015 Discharge date: 07/19/2015  Time spent: 35 minutes  Recommendations for Outpatient Follow-up:  1. Discharge home with [facility] and PT. Follow-up with outpatient dialysis starting 07/21/2015 2. Called cardiology to arrange outpatient Coumadin clinic appointment.   Discharge Diagnoses:  Principal Problem:   Acute respiratory failure with hypoxia and hypercarbia (HCC)   Active Problems:   Sepsis (HCC)   CAD in native artery - CABG x4 (LIMA-LAD, SVG-distal RCA, SVG-diagonal, SVG-Ramus     Cardiorenal syndrome with renal failure   NSVT (nonsustained ventricular tachycardia) (HCC)   Chronic systolic CHF (congestive heart failure) (HCC)   End stage renal disease (HCC)   Hypertension, essential   Hyperlipidemia with target LDL less than 70   Renal artery stenosis, native, bilateral (HCC)   Chronic renal insufficiency, stage V (HCC)   Acute on chronic renal failure (HCC)   Dyspnea   Transaminitis   Elevated troponin   Unintentional weight loss   Lactic acidosis   Community acquired pneumonia   Shortness of breath   New onset atrial fibrillation (HCC)   Congestive dilated cardiomyopathy (HCC)   Hypothermia    Discharge Condition: Fair  Diet recommendation: Renal/heart healthy  Filed Weights   07/16/15 1038 07/18/15 0749 07/19/15 0611  Weight: 71.4 kg (157 lb 6.5 oz) 73 kg (160 lb 15 oz) 71.8 kg (158 lb 4.6 oz)    History of present illness:  Please refer to admission H&P for details, in brief, 80 year old female with history of alcohol abuse, coronary artery disease, peripheral vascular disease and seek ED stage III who was sent to the ED by her PCP with fever of 102F and ongoing cough. In the ED she had elevated BNP and troponin with new onset A. fib with RVR as well as elevated lactic acid. She was also found to have acute on  chronic kidney disease along with transaminitis. Patient was admitted to the hospital with sepsis and acute kidney injury. Hospital course complicated with need for dialysis, bilateral renal artery stenosis and severe acute systolic CHF.  Hospital Course:  Sepsis secondary to pneumonia resolved.. Now off antibiotics.  Acute on chronic kidney disease stage V Now requiring hemodialysis. Clipped to outpatient dialysis center starting 07/21/2015.   HD catheter placed by IR On 2/24.   Acute systolic CHF with elevated troponin 2-D echo showing EF of 20-25%. Appears euvolemic with volume management with dialysis. Appreciate cardiology recommendations. Cardiac cath done on 2/20 showed showed patent grafts with no new lesions. Cardiology suggests this is nonischemic cardiomyopathy with underlying A. fib versus volume overload from renal insufficiency. -Continue metoprolol and hydralazine (dose adjusted) -Continue Plavix. Will discontinue baby aspirin now that patient is also on Coumadin.  New onset A. fib with RVR Hypertension and alcohol abuse may be contributing. Now in normal sinus rhythm after dialysis and addition of amiodarone. Started on coumadin with heparin bridge. INR now therapeutic. (Jumped to 2.88 this morning) . She will hold off on Coumadin today and start on 5 mg daily from tomorrow. Have ask cardiology to arrange outpatient Coumadin clinic appointment early this week.   Bilateral renal artery stenosis Renal artery angiograms showed no changes from prior study. A patent left renal artery stent with mild in-stent restenosis and proximal right renal artery stenosis is stable.  CAD status post CABG in 2007 Continue Plavix. Hold baby aspirin now the patient is also on Coumadin. Will hold  lipid-lowering agent given her transaminitis. Follow-up with cardiology.  Transaminitis Possibly associated with alcoholic hepatitis. Counseled on cessation. Follow-up LFTs improving.    Patient  is hemodynamically stable. Was seen by PT and recommended home health PT along with a rolling walker. This will be arranged. Patient can be discharged home with outpatient follow-up..  Family Communication: family at bedside Disposition Plan: Home with home health   Consultants:  Renal  Cardiology  IR  Procedures:  Left heart catheterization and renal angiogram on 2/20  Tunneled HD catheter placement possibly on 2/24)  Antibiotics:  None    Discharge Exam: Filed Vitals:   07/19/15 0622 07/19/15 1100  BP: 132/50 116/50  Pulse:  66  Temp:  98.7 F (37.1 C)  Resp:  18     General: not in distress  HEENT: Moist mucosa  Chest: Clear bilaterally, left sided HD catheter  CVS: S1 &S2 regular, no murmurs rub or gallop  GI: Soft, nondistended, nontender  musculoskeletal: Warm, no edema  CNS: Alert and oriented  Discharge Instructions    Current Discharge Medication List    START taking these medications   Details  amiodarone (PACERONE) 400 MG tablet Take 1 tablet (400 mg total) by mouth 2 (two) times daily. Qty: 60 tablet, Refills: 0    Nutritional Supplements (FEEDING SUPPLEMENT, NEPRO CARB STEADY,) LIQD Take 237 mLs by mouth 2 (two) times daily between meals. Qty: 60 Can, Refills: 0    warfarin (COUMADIN) 5 MG tablet Take 1 tablet (5 mg total) by mouth daily at 6 PM. Qty: 30 tablet, Refills: 0  Starting 07/20/2015.      CONTINUE these medications which have CHANGED   Details  hydrALAZINE (APRESOLINE) 10 MG tablet Take 1 tablet (10 mg total) by mouth every 8 (eight) hours. Qty: 90 tablet, Refills: 0    metoprolol (LOPRESSOR) 50 MG tablet Take 1.5 tablets (75 mg total) by mouth 2 (two) times daily. Qty: 180 tablet, Refills: 3      CONTINUE these medications which have NOT CHANGED   Details  acetaminophen (TYLENOL) 325 MG tablet Take 650 mg by mouth every 6 (six) hours as needed for moderate pain.    betamethasone dipropionate (DIPROLENE) 0.05 %  cream Apply 1 application topically 2 (two) times daily as needed. ECZEMA    clopidogrel (PLAVIX) 75 MG tablet TAKE 1 TABLET (75 MG TOTAL) BY MOUTH DAILY WITH BREAKFAST. Qty: 30 tablet, Refills: 11    colchicine 0.6 MG tablet Take 0.6 mg by mouth 2 (two) times daily as needed. GOUT FLARE UP.    febuxostat (ULORIC) 40 MG tablet Take 40 mg by mouth daily.    furosemide (LASIX) 40 MG tablet Take 40 mg by mouth 2 (two) times daily.    LORazepam (ATIVAN) 0.5 MG tablet Take 0.5 mg by mouth 4 (four) times daily as needed for anxiety.    Multiple Vitamin (MULTIVITAMIN) tablet Take 1 tablet by mouth daily.    Multiple Vitamins-Minerals (ICAPS) CAPS Take 1 capsule by mouth 2 (two) times daily.    pantoprazole (PROTONIX) 40 MG tablet Take 1 tablet (40 mg total) by mouth daily. Qty: 30 tablet, Refills: 6      STOP taking these medications     Alirocumab 75 MG/ML SOPN      aspirin 81 MG tablet      cefUROXime (CEFTIN) 500 MG tablet      chlorpheniramine-HYDROcodone (TUSSIONEX) 10-8 MG/5ML SUER        Allergies  Allergen Reactions  . Angiotensin  Receptor Blockers   . Latex Swelling  . Statins Other (See Comments)    Diffuse cramping; has tried Lipitor, Crestor, Pravachol and simvastatin  . Benicar [Olmesartan] Itching and Rash   Follow-up Information    Follow up with [facility]. Go on 07/21/2015.   Specialty:  Cardiology   Why:  @ 9:50am for INR check   Contact information:   [address] [place] [address] [phone]      Follow up with Nanetta Batty, MD In 1 week.   Specialties:  Cardiology, Radiology   Contact information:   [address] [place] [address] [phone]        The results of significant diagnostics from this hospitalization (including imaging, microbiology, ancillary and laboratory) are listed below for reference.    Significant Diagnostic Studies: Ct Abdomen Pelvis Wo  Contrast  07/07/2015  CLINICAL DATA:  Small bowel obstruction. Perforated viscus. Abnormal x-rays appear EXAM: CT ABDOMEN AND PELVIS WITHOUT CONTRAST TECHNIQUE: Multidetector CT imaging of the abdomen and pelvis was performed following the standard protocol without IV contrast. COMPARISON:  Plain films performed today. FINDINGS: Mild cardiomegaly. Small left pleural effusion and trace right effusion. Minimal dependent atelectasis in the lung bases. Liver, gallbladder, spleen, pancreas, adrenals have an unremarkable unenhanced appearance. Vascular calcifications in the renal hila bilaterally. No ureteral stones or hydronephrosis. Urinary bladder is unremarkable. Scattered colonic diverticula.  No active diverticulitis. The previously seen small bowel prominence is less evident by CT. There is mild prominence of upper abdominal small bowel loops with decompressed distal small bowel loops. Contrast material has made it into the right side of the colon. I do not see any transition point. No CT evidence for obstruction. Aorta and iliac vessels are heavily calcified, non aneurysmal. Prior hysterectomy. No adnexal masses. No free fluid, free air or adenopathy. No acute bony abnormality or focal bone lesion. IMPRESSION: No CT evidence for small bowel obstruction. No evidence of free air as questioned on plain films. Scattered colonic diverticulosis.  No active diverticulitis. Small left pleural effusion.  Trace right pleural effusion. Electronically Signed   By: Charlett Nose M.D.   On: 07/07/2015 16:01   Dg Chest 2 View  07/12/2015  CLINICAL DATA:  Pneumonia EXAM: CHEST  2 VIEW COMPARISON:  07/05/2015 chest radiograph.  07/08/2015 chest CT. FINDINGS: Right internal jugular central venous catheter terminates in the lower third of the superior vena cava. CABG clips overlie the mediastinum. Sternotomy wires appear aligned and intact. Stable cardiomediastinal silhouette with mild cardiomegaly. No pneumothorax. No pleural  effusion. Stable mild pulmonary edema. No consolidative airspace disease. IMPRESSION: Mild congestive heart failure, stable. No consolidative airspace disease. Electronically Signed   By: Delbert Phenix M.D.   On: 07/12/2015 14:20   Dg Chest 2 View  07/05/2015  CLINICAL DATA:  80 year old female with shortness of breath. EXAM: CHEST  2 VIEW COMPARISON:  07/03/2015 and prior exams FINDINGS: Cardiomegaly, CABG changes and mild pulmonary vascular congestion again noted. There is no evidence of focal airspace disease, pulmonary edema, suspicious pulmonary nodule/mass, pleural effusion, or pneumothorax. No acute bony abnormalities are identified. IMPRESSION: Cardiomegaly with mild pulmonary vascular congestion. Electronically Signed   By: Harmon Pier M.D.   On: 07/05/2015 09:26   Dg Chest 2 View  07/03/2015  CLINICAL DATA:  Cough, congestion, shortness of breath, and weakness for 3 weeks. EXAM: CHEST  2 VIEW COMPARISON:  05/30/2006 FINDINGS: Mild enlargement of the cardiopericardial silhouette, with mild cephalization of blood  flow but no overt edema. No Kerley B-lines observed. Prior CABG.  Atherosclerotic calcification of the aortic arch. Thoracic spondylosis.  No pleural effusion. IMPRESSION: 1. Stable mild enlargement of the cardiopericardial silhouette, with pulmonary venous hypertension but without overt edema. 2. Prior CABG. 3. Atherosclerotic aortic arch. Electronically Signed   By: Gaylyn Rong M.D.   On: 07/03/2015 12:06   Ct Chest Wo Contrast  07/08/2015  CLINICAL DATA:  Shortness of breath. EXAM: CT CHEST WITHOUT CONTRAST TECHNIQUE: Multidetector CT imaging of the chest was performed following the standard protocol without IV contrast. COMPARISON:  Radiography an abdominal CT 07/07/2015 FINDINGS: There is a small amount of pleural fluid layering dependently, more on the left than the right. There is no pericardial fluid. There is atherosclerosis of the aorta and its branch vessels including the  coronary arteries. No enlarged hilar or mediastinal lymph nodes are visible. Mild prominence of the septal lines in the lungs could be associated with mild edema/ fluid overload. No advanced pulmonary edema. Micronodular opacities within both lungs, probably tree in bud opacities, most likely to reflect bronchopneumonia with atypical organisms such as mycobacterium. The differential diagnosis does include an early manifestation of edema and micronodular metastatic disease, but those are felt less likely. Scans in the upper abdomen do not show any acute finding. IMPRESSION: Advanced atherosclerosis of the aorta and its branch vessels. Probable mild edema/fluid overload. Small effusions layering dependently, left larger than right. Bilateral tree in bud opacities usually indicative of bronchopneumonia with atypical organisms such as mycobacterium. Differential diagnosis does include an early manifestation of edema and micronodular metastatic disease, but those are felt less likely. Electronically Signed   By: Paulina Fusi M.D.   On: 07/08/2015 10:45   US Abdomen Complete  07/04/2015  CLINICAL DATA:  80 year old female with transaminitis/ elevated LFTs. Patient with chronic renal disease. EXAM: ABDOMEN ULTRASOUND COMPLETE COMPARISON:  None. FINDINGS: Gallbladder: The gallbladder is unremarkable. There is no evidence of acute cholecystitis or cholelithiasis. Common bile duct: Diameter: 5 mm. There is no evidence of intrahepatic or extrahepatic biliary dilatation. Liver: Slightly increased and heterogeneous hepatic echotexture identified without focal lesions. IVC: No abnormality visualized. Pancreas: Visualized portion unremarkable. Spleen: Size and appearance within normal limits. Right Kidney: Length: 11.1 cm. Cortical thinning and increased renal echogenicity noted. There is no evidence of hydronephrosis or solid mass. Left Kidney: Length: 11.1 cm. Cortical thinning and increased renal echogenicity noted. There is  no evidence of hydronephrosis or solid mass. Abdominal aorta: No aneurysm visualized. Other findings: None. IMPRESSION: Slightly increased and heterogeneous hepatic echotexture which is nonspecific but may represent hepatic steatosis. Cortical thinning and increased renal echogenicity compatible with medical renal disease. No other significant abnormalities. Electronically Signed   By: Harmon Pier M.D.   On: 07/04/2015 12:29   Ir Fluoro Guide Cv Line Left  07/17/2015  INDICATION: Renal failure EXAM: TUNNELED DIALYSIS CATHETER PLACEMENT, ULTRASOUND GUIDANCE FOR VASCULAR ACCESS MEDICATIONS: ; The antibiotic was administered within an appropriate time interval prior to skin puncture. ANESTHESIA/SEDATION: Versed 2 mg IV; Fentanyl 100 mcg IV; Moderate Sedation Time:  20 The patient was continuously monitored during the procedure by the interventional radiology nurse under my direct supervision. FLUOROSCOPY TIME:  Fluoroscopy Time: 0 minutes 42 seconds (2 mGy). COMPLICATIONS: None immediate. PROCEDURE: Informed written consent was obtained from the patient after a thorough discussion of the procedural risks, benefits and alternatives. All questions were addressed. Maximal Sterile Barrier Technique was utilized including caps, mask, sterile gowns, sterile gloves, sterile drape, hand  hygiene and skin antiseptic. A timeout was performed prior to the initiation of the procedure. The left neck was prepped with ChloraPrep in a sterile fashion, and a sterile drape was applied covering the operative field. A sterile gown and sterile gloves were used for the procedure. 1% lidocaine into the skin and subcutaneous tissue. The left internal jugular vein was noted to be patent initially with ultrasound. Under sonographic guidance, a micropuncture needle was inserted into the right IJ vein (Ultrasound and fluoroscopic image documentation was performed). It was removed over an 018 wire which was up-sized to an Amplatz. This was  advanced into the IVC. A small incision was made in the left upper chest. The tunneling device was utilized to advance the 28 centimeter tip to cuff catheter from the chest incision and out the neck incision. A peel-away sheath was advanced over the Amplatz wire. The leading edge of the catheter was then advanced through the peel-away sheath. The peel-away sheath was removed. It was flushed and instilled with heparin. The chest incision was closed with a 0 Prolene pursestring stitch. The neck incision was closed with a 4-0 Vicryl subcuticular stitch. IMPRESSION: Successful left IJ vein tunneled dialysis catheter with its tip in the right atrium. Electronically Signed   By: Jolaine Click M.D.   On: 07/17/2015 13:39   Ir US Guide Vasc Access Left  07/17/2015  INDICATION: Renal failure EXAM: TUNNELED DIALYSIS CATHETER PLACEMENT, ULTRASOUND GUIDANCE FOR VASCULAR ACCESS MEDICATIONS: ; The antibiotic was administered within an appropriate time interval prior to skin puncture. ANESTHESIA/SEDATION: Versed 2 mg IV; Fentanyl 100 mcg IV; Moderate Sedation Time:  20 The patient was continuously monitored during the procedure by the interventional radiology nurse under my direct supervision. FLUOROSCOPY TIME:  Fluoroscopy Time: 0 minutes 42 seconds (2 mGy). COMPLICATIONS: None immediate. PROCEDURE: Informed written consent was obtained from the patient after a thorough discussion of the procedural risks, benefits and alternatives. All questions were addressed. Maximal Sterile Barrier Technique was utilized including caps, mask, sterile gowns, sterile gloves, sterile drape, hand hygiene and skin antiseptic. A timeout was performed prior to the initiation of the procedure. The left neck was prepped with ChloraPrep in a sterile fashion, and a sterile drape was applied covering the operative field. A sterile gown and sterile gloves were used for the procedure. 1% lidocaine into the skin and subcutaneous tissue. The left internal  jugular vein was noted to be patent initially with ultrasound. Under sonographic guidance, a micropuncture needle was inserted into the right IJ vein (Ultrasound and fluoroscopic image documentation was performed). It was removed over an 018 wire which was up-sized to an Amplatz. This was advanced into the IVC. A small incision was made in the left upper chest. The tunneling device was utilized to advance the 28 centimeter tip to cuff catheter from the chest incision and out the neck incision. A peel-away sheath was advanced over the Amplatz wire. The leading edge of the catheter was then advanced through the peel-away sheath. The peel-away sheath was removed. It was flushed and instilled with heparin. The chest incision was closed with a 0 Prolene pursestring stitch. The neck incision was closed with a 4-0 Vicryl subcuticular stitch. IMPRESSION: Successful left IJ vein tunneled dialysis catheter with its tip in the right atrium. Electronically Signed   By: Jolaine Click M.D.   On: 07/17/2015 13:39   Dg Abd Acute W/chest  07/07/2015  CLINICAL DATA:  Hypothermia, history of CABG, former smoking history EXAM: DG ABDOMEN ACUTE W/  1V CHEST COMPARISON:  Chest x-ray of 07/05/2015 and ultrasound of the abdomen of 07/04/2015 FINDINGS: No active infiltrate or effusion is seen. Cardiomegaly is stable. Median sternotomy sutures are noted from prior CABG. Supine and left lateral decubitus films of the abdomen were obtained. Better seen on the supine film there are somewhat distended loops of small bowel, and the colon and distal small bowel appears decompressed. A partial small bowel obstruction is a definite consideration. On the left lower decubitus films, there is some lucency just above the right lobe of liver, and a small amount of free intraperitoneal air cannot be excluded. CT the abdomen pelvis is recommended to assess for possible free air as well as evaluate the possibility of partial small bowel obstruction. No  opaque calculi are seen. The bones are osteopenic. IMPRESSION: 1. Prominent loops of small bowel may indicate a partial small bowel obstruction. 2. Cannot exclude a small amount of free air on the decubitus film. In view of these findings, CT of the abdomen pelvis at this time is recommended. 3. No active lung disease.  Cardiomegaly. I spoke with Dr. Ramiro Harvest concerning the findings of this study at 12:51 p.m. Electronically Signed   By: Dwyane Dee M.D.   On: 07/07/2015 12:52    Microbiology: No results found for this or any previous visit (from the past 240 hour(s)).   Labs: Basic Metabolic Panel:  Recent Labs Lab 07/13/15 0730 07/14/15 0706 07/15/15 0832 07/16/15 0638 07/16/15 0733 07/18/15 0706  NA 139 136 138 136 136 137  K 4.3 4.2 4.4 4.1 4.2 4.3  CL 100* 97* 97* 98* 98* 100*  CO2 22 24 25 26 26 23   GLUCOSE 96 109* 95 109* 104* 98  BUN 73* 86* 33* 52* 52* 44*  CREATININE 3.56* 4.23* 2.92* 3.75* 3.66* 4.04*  CALCIUM 8.7* 8.8* 8.5* 8.5* 8.5* 8.5*  PHOS 5.0* 5.9* 3.9  --  4.1 4.0   Liver Function Tests:  Recent Labs Lab 07/14/15 0706 07/15/15 0832 07/16/15 0638 07/16/15 0733 07/18/15 0706  AST  --   --  27  --   --   ALT  --   --  101*  --   --   ALKPHOS  --   --  79  --   --   BILITOT  --   --  0.6  --   --   PROT  --   --  5.6*  --   --   ALBUMIN 3.0* 2.9* 3.1* 3.0* 2.9*   No results for input(s): LIPASE, AMYLASE in the last 168 hours. No results for input(s): AMMONIA in the last 168 hours. CBC:  Recent Labs Lab 07/16/15 0638 07/16/15 0735 07/17/15 0437 07/18/15 0527 07/19/15 0410  WBC 12.5* 13.2* 10.5 11.7* 8.7  HGB 10.6* 10.4* 9.4* 10.1* 9.7*  HCT 32.7* 32.2* 30.4* 32.6* 31.8*  MCV 99.1 99.1 97.1 97.0 97.5  PLT 171 177 117* 123* 99*   Cardiac Enzymes: No results for input(s): CKTOTAL, CKMB, CKMBINDEX, TROPONINI in the last 168 hours. BNP: BNP (last 3 results)  Recent Labs  07/03/15 1236  BNP 4223.3*    ProBNP (last 3 results) No  results for input(s): PROBNP in the last 8760 hours.  CBG:  Recent Labs Lab 07/13/15 0431  GLUCAP 99       Signed:  Eddie North MD.  [facility] 07/19/2015, 12:30 PM

## 2015-07-19 NOTE — Progress Notes (Signed)
ANTICOAGULATION CONSULT NOTE - Follow Up Consult  Pharmacy Consult for heparin + Coumadin Indication: atrial fibrillation  Allergies  Allergen Reactions  . Angiotensin Receptor Blockers   . Latex Swelling  . Statins Other (See Comments)    Diffuse cramping; has tried Lipitor, Crestor, Pravachol and simvastatin  . Benicar [Olmesartan] Itching and Rash    Patient Measurements: Height: 5\' 3"  (160 cm) Weight: 158 lb 4.6 oz (71.8 kg) IBW/kg (Calculated) : 52.4 Heparin Dosing Weight: 63 kg  Vital Signs: Temp: 97.7 F (36.5 C) (02/26 0611) Temp Source: Oral (02/26 0611) BP: 132/50 mmHg (02/26 0622) Pulse Rate: 68 (02/26 0611)  Labs:  Recent Labs  07/17/15 0437 07/17/15 0830  07/18/15 0527 07/18/15 0706 07/18/15 1447 07/18/15 1959 07/19/15 0410  HGB 9.4*  --   --  10.1*  --   --   --  9.7*  HCT 30.4*  --   --  32.6*  --   --   --  31.8*  PLT 117*  --   --  123*  --   --   --  99*  LABPROT  --  18.3*  --  21.0*  --   --   --  29.6*  INR  --  1.52*  --  1.82*  --   --   --  2.88*  HEPARINUNFRC <0.10*  --   < >  --  0.84* 0.82* 0.63 0.64  CREATININE  --   --   --   --  4.04*  --   --   --   < > = values in this interval not displayed.  Estimated Creatinine Clearance: 11.1 mL/min (by C-G formula based on Cr of 4.04).   Assessment: On Heparin bridge to warfarin for new onset afib, S/p RHC 2/16 and LHC 2/20. CHADS2VASC: 5. Warfarin was previously held for HD cath which was completed on 2/24.  INR today up to 2.88 from 1.82 within one day. Hgb 9.7. Plts 99, baseline plts around 170-200, no bleeding noted.  HL is therapeutic 0.64 on 900 units/h. INR is also therapeutic, but am concerned with how quickly it increased. No iv line/bleed issues noted.   Goal of Therapy:  INR 2-3 Heparin level 0.3-0.7 units/ml Monitor platelets by anticoagulation protocol: Yes   Plan:  - D/C heparin with therapeutic INR - Hold evening warfarin dose and follow up with next INR for next  dose - Daily INR and CBC - Follow for s/s bleeding - Warfarin education completed   Sherron Monday, PharmD Clinical Pharmacy Resident Pager: [phone] 07/19/2015 7:56 AM

## 2015-07-21 ENCOUNTER — Encounter: Payer: Medicare Other | Admitting: Pharmacist Clinician (PhC)/ Clinical Pharmacy Specialist

## 2015-07-22 ENCOUNTER — Telehealth: Payer: Self-pay | Admitting: Cardiovascular Disease

## 2015-07-22 NOTE — Telephone Encounter (Signed)
Tried to call back; placed on hold for 10 minutes

## 2015-07-22 NOTE — Telephone Encounter (Signed)
New Message  Heather with [facility] called states that she has orders from home health for PT,  OT and nursing. She states that [facility] has a little delay with the nurses however would like to move forward with PT as soon as march 2nd. The nursing maybe available by the 3rd or the 4th. Please call back to discuss.

## 2015-07-23 NOTE — Telephone Encounter (Signed)
Attempted to call, no answer after holding.

## 2015-07-24 ENCOUNTER — Telehealth: Payer: Self-pay | Admitting: Cardiovascular Disease

## 2015-07-24 ENCOUNTER — Ambulatory Visit (INDEPENDENT_AMBULATORY_CARE_PROVIDER_SITE_OTHER): Payer: Medicare Other | Admitting: Internal Medicine

## 2015-07-24 DIAGNOSIS — I481 Persistent atrial fibrillation: Secondary | ICD-10-CM

## 2015-07-24 DIAGNOSIS — I480 Paroxysmal atrial fibrillation: Secondary | ICD-10-CM

## 2015-07-24 DIAGNOSIS — I4819 Other persistent atrial fibrillation: Secondary | ICD-10-CM

## 2015-07-24 DIAGNOSIS — Z5181 Encounter for therapeutic drug level monitoring: Secondary | ICD-10-CM

## 2015-07-24 LAB — POCT INR: INR: 8

## 2015-07-24 NOTE — Telephone Encounter (Signed)
I received a call from Ms. Bufford's home health nurse that her INR is 8.  I called Ms. Gerbino and confirmed that she is not having any active bleeding.  She thinks that her INR is being checked at dialysis, but she is unsure of who is managing her coumadin.  I have instructed her to hold the coumadin Fri-Sun evening.  We will call her with an appointment in the anticoagulation clinic on Monday.  If she experiences any bleeding, she will go to the ED.   Sabas Frett C. Duke Salvia, MD, [facility]  07/24/2015  9:03 PM

## 2015-07-24 NOTE — Telephone Encounter (Signed)
New message    Pt daughter is callign to see if the Dr.Harding have the pt INR? And how many times do it need to be checked?

## 2015-07-24 NOTE — Telephone Encounter (Signed)
Patients daughter called.  She is not clear about when and how often the INR is being drawn.  Was told it would be done at dialysis and sent to Dr. Herbie Baltimore Last INR done in hospital on 2/26 She was having dialysis on 2/28 outpatient so the visit with Belenda Cruise was cancelled Very confusing to follow the trail in the chart as to a plan for transition from the hospital to home Will route to [place] to look into on Monday when she is back in the office so that she can figure out the plan of checking INR and coumadin dosing  Please call daughter back on Monday and discuss:  Raulston,Robyn Daughter [phone]

## 2015-07-27 ENCOUNTER — Ambulatory Visit (INDEPENDENT_AMBULATORY_CARE_PROVIDER_SITE_OTHER): Payer: Medicare Other

## 2015-07-27 DIAGNOSIS — I481 Persistent atrial fibrillation: Secondary | ICD-10-CM

## 2015-07-27 DIAGNOSIS — Z5181 Encounter for therapeutic drug level monitoring: Secondary | ICD-10-CM

## 2015-07-27 DIAGNOSIS — I4819 Other persistent atrial fibrillation: Secondary | ICD-10-CM

## 2015-07-27 DIAGNOSIS — I480 Paroxysmal atrial fibrillation: Secondary | ICD-10-CM

## 2015-07-27 LAB — POCT INR: INR: 2.6

## 2015-07-27 NOTE — Telephone Encounter (Signed)
Spoke with Melina Schools (daughter), will continue with [facility] as long as able, then determine if dialysis or office draws.  Call results to Robin from [facility].

## 2015-07-30 ENCOUNTER — Other Ambulatory Visit: Payer: Self-pay | Admitting: *Deleted

## 2015-07-30 ENCOUNTER — Ambulatory Visit: Payer: Medicare Other | Admitting: Physician Assistant

## 2015-07-30 DIAGNOSIS — N185 Chronic kidney disease, stage 5: Secondary | ICD-10-CM

## 2015-07-31 ENCOUNTER — Ambulatory Visit (INDEPENDENT_AMBULATORY_CARE_PROVIDER_SITE_OTHER): Payer: Medicare Other | Admitting: Pharmacist

## 2015-07-31 DIAGNOSIS — I480 Paroxysmal atrial fibrillation: Secondary | ICD-10-CM

## 2015-07-31 DIAGNOSIS — I4819 Other persistent atrial fibrillation: Secondary | ICD-10-CM

## 2015-07-31 DIAGNOSIS — I481 Persistent atrial fibrillation: Secondary | ICD-10-CM

## 2015-07-31 DIAGNOSIS — Z5181 Encounter for therapeutic drug level monitoring: Secondary | ICD-10-CM

## 2015-07-31 LAB — POCT INR: INR: 1.6

## 2015-08-05 ENCOUNTER — Ambulatory Visit (INDEPENDENT_AMBULATORY_CARE_PROVIDER_SITE_OTHER): Payer: Medicare Other | Admitting: Interventional Cardiology

## 2015-08-05 DIAGNOSIS — I4819 Other persistent atrial fibrillation: Secondary | ICD-10-CM

## 2015-08-05 DIAGNOSIS — I480 Paroxysmal atrial fibrillation: Secondary | ICD-10-CM

## 2015-08-05 DIAGNOSIS — I481 Persistent atrial fibrillation: Secondary | ICD-10-CM

## 2015-08-05 DIAGNOSIS — Z5181 Encounter for therapeutic drug level monitoring: Secondary | ICD-10-CM

## 2015-08-05 LAB — POCT INR: INR: 1.9

## 2015-08-10 ENCOUNTER — Ambulatory Visit (INDEPENDENT_AMBULATORY_CARE_PROVIDER_SITE_OTHER): Payer: Medicare Other | Admitting: Pharmacist Clinician (PhC)/ Clinical Pharmacy Specialist

## 2015-08-10 ENCOUNTER — Encounter: Payer: Self-pay | Admitting: Surgery

## 2015-08-10 ENCOUNTER — Inpatient Hospital Stay (HOSPITAL_COMMUNITY): Admission: RE | Admit: 2015-08-10 | Payer: Medicare Other | Source: Ambulatory Visit

## 2015-08-10 DIAGNOSIS — I4819 Other persistent atrial fibrillation: Secondary | ICD-10-CM

## 2015-08-10 DIAGNOSIS — Z5181 Encounter for therapeutic drug level monitoring: Secondary | ICD-10-CM

## 2015-08-10 DIAGNOSIS — I480 Paroxysmal atrial fibrillation: Secondary | ICD-10-CM

## 2015-08-10 DIAGNOSIS — I481 Persistent atrial fibrillation: Secondary | ICD-10-CM

## 2015-08-10 LAB — POCT INR: INR: 3.7

## 2015-08-14 ENCOUNTER — Ambulatory Visit (INDEPENDENT_AMBULATORY_CARE_PROVIDER_SITE_OTHER): Payer: Medicare Other | Admitting: Physician Assistant

## 2015-08-14 ENCOUNTER — Encounter: Payer: Self-pay | Admitting: Surgery

## 2015-08-14 ENCOUNTER — Encounter: Payer: Self-pay | Admitting: Physician Assistant

## 2015-08-14 ENCOUNTER — Ambulatory Visit (INDEPENDENT_AMBULATORY_CARE_PROVIDER_SITE_OTHER): Payer: Medicare Other | Admitting: Surgery

## 2015-08-14 ENCOUNTER — Ambulatory Visit (HOSPITAL_COMMUNITY)
Admission: RE | Admit: 2015-08-14 | Discharge: 2015-08-14 | Disposition: A | Discharge status: Home / Self Care | Payer: Medicare Other | Source: Ambulatory Visit | Attending: Surgery | Admitting: Surgery

## 2015-08-14 ENCOUNTER — Ambulatory Visit (INDEPENDENT_AMBULATORY_CARE_PROVIDER_SITE_OTHER)
Admission: RE | Admit: 2015-08-14 | Discharge: 2015-08-14 | Disposition: A | Discharge status: Home / Self Care | Payer: Medicare Other | Source: Ambulatory Visit | Attending: Surgery | Admitting: Surgery

## 2015-08-14 VITALS — BP 130/74 | HR 48 | Ht 63.0 in | Wt 155.0 lb

## 2015-08-14 VITALS — BP 138/53 | HR 57 | Temp 98.5°F | Resp 14 | Ht 63.0 in | Wt 156.3 lb

## 2015-08-14 DIAGNOSIS — N185 Chronic kidney disease, stage 5: Secondary | ICD-10-CM | POA: Diagnosis not present

## 2015-08-14 DIAGNOSIS — I2510 Atherosclerotic heart disease of native coronary artery without angina pectoris: Secondary | ICD-10-CM

## 2015-08-14 DIAGNOSIS — N186 End stage renal disease: Secondary | ICD-10-CM

## 2015-08-14 DIAGNOSIS — I10 Essential (primary) hypertension: Secondary | ICD-10-CM | POA: Diagnosis not present

## 2015-08-14 DIAGNOSIS — I480 Paroxysmal atrial fibrillation: Secondary | ICD-10-CM

## 2015-08-14 DIAGNOSIS — I5022 Chronic systolic (congestive) heart failure: Secondary | ICD-10-CM

## 2015-08-14 DIAGNOSIS — I429 Cardiomyopathy, unspecified: Secondary | ICD-10-CM

## 2015-08-14 DIAGNOSIS — Z992 Dependence on renal dialysis: Secondary | ICD-10-CM | POA: Diagnosis not present

## 2015-08-14 DIAGNOSIS — Z79899 Other long term (current) drug therapy: Secondary | ICD-10-CM

## 2015-08-14 DIAGNOSIS — I701 Atherosclerosis of renal artery: Secondary | ICD-10-CM | POA: Diagnosis not present

## 2015-08-14 DIAGNOSIS — Z01810 Encounter for preprocedural cardiovascular examination: Secondary | ICD-10-CM | POA: Diagnosis not present

## 2015-08-14 DIAGNOSIS — E785 Hyperlipidemia, unspecified: Secondary | ICD-10-CM

## 2015-08-14 DIAGNOSIS — I428 Other cardiomyopathies: Secondary | ICD-10-CM

## 2015-08-14 MED ORDER — METOPROLOL TARTRATE 50 MG PO TABS: 75.0000 mg | ORAL_TABLET | Freq: Two times a day (BID) | ORAL | Status: DC

## 2015-08-14 MED ORDER — AMIODARONE HCL 200 MG PO TABS: 200.0000 mg | ORAL_TABLET | Freq: Every day | ORAL | Status: AC

## 2015-08-14 MED ORDER — AMIODARONE HCL 400 MG PO TABS: 200.0000 mg | ORAL_TABLET | ORAL | Status: DC

## 2015-08-14 NOTE — Progress Notes (Signed)
Patient name: [name] MRN: [id number] DOB: July 15, 1935 Sex: female   Referred by: Dr. Eliott Nine  Reason for referral:  Chief Complaint  Patient presents with  . New Evaluation    HISTORY OF PRESENT ILLNESS: The patient was recently in the hospital with multiple medical problems including sepsis, worsening kidney failure acute systolic heart failure new onset atrial fibrillation with rapid ventricular response.  She is now on dialysis through a catheter.  She did not have permanent access discussions while she was in the hospital, she was refusing to have them done and was not felt that she would survive.  She is accident well since the hospital and is tolerating dialysis.  The patient is right handed.  She is on Coumadin and Plavix.  She takes the Plavix for history of renal artery stenosis and the Coumadin for atrial fibrillation she has a history of coronary artery disease, status post CABG.  She has also undergone a right femoral-popliteal bypass in 1997 by Dr. Hart Rochester.  Past Medical History  Diagnosis Date  . CAD in native artery 2007    a. S/p CABG 2007 (LIMA-LAD, SVG-dRCA, SVG-intermediate, SVG-diagonal). b. Nuclear stress test 08/2012: normal, low risk.  . S/P CABG x 4 2007    LIMA-LAD, SVG-Distal RCA, SVG-Ramus, SVG-Diagonal; no echocardiogram done  . PAD (peripheral artery disease) (HCC) 1997    Mild-moderate carotid disease; status post right SFA occlusion with Fem-Below Knee Pop bypass (Dr. Hart Rochester); LEA Dopplers 07/2014: RIGHT - ABI 0.84, CIA/EIA 50-69%, CFA/PFA patent Fem-Pop bypass patent w/ 70-99% anastomotic stenosis, patent Pop A with 3 V runoff; LABI 0.85- L CIA/EIA patent, LPFA 70-99%, dLSFA 50-69%, patent LPopA & 3 V runoff.  . Renovascular hypertension 12/17/2013    a. s/p L RA Stent 11/2013. b. Renal duplex 02/2015: >60% right proximal renal artery stenosis, normal left renal artery s/p stent, f/u 1 yr recommended.  . CKD (chronic kidney disease), stage III     . Essential hypertension   . Hyperlipidemia LDL goal <70   . Carotid artery disease (HCC)     a. Mild-mod carotid disease per Dr.Berry's note.  . Anemia     Past Surgical History  Procedure Laterality Date  . Cardiac catheterization  December 2007    LAD-90% mid, D19 percent ostial. Circumflex-OM1 90% ostial, 80% mid. RCA 9% proximal, 100% mid  . Femoropopliteal bypass Right 1997    Dr. Hart Rochester: SFA-below knee Pop  . Coronary artery bypass graft  December 2007    LIMA-LAD, SVG-distal RCA, SVG-D1, SVG-OM1/Ramus  . Nm myoview ltd  April 2014    EF 63%, mild fixed anteroapical defect thought to be breast attenuation  . Appendectomy    . Tonsillectomy    . Cataract surgery    . Rectovaginal fistula repair  1997  . [place] bhl  1981  . Renal artery stent Left 12/16/13    using CO2  . Lower extremity venous doppler  06/13/2011    No evidence of thrombus or thrombophlebitis, no venous insuffiency noted.  . Cardiovascular stress test  08/28/2012    Normal stress nuclear study with likely breast attenuation, low risk stress test.  . Renal angiogram N/A 12/16/2013    Procedure: RENAL ANGIOGRAM;  Surgeon: Runell Gess, MD;  Location: [facility] CATH LAB;  Service: Cardiovascular;  Laterality: N/A;  . Percutaneous stent intervention Left 12/16/2013    Procedure: PERCUTANEOUS STENT INTERVENTION - LEFT RENAL ARTERY;  Surgeon: Runell Gess, MD;  Location: [facility] CATH LAB;  Service: Cardiovascular;  Laterality: Left;  renal  . Cardiac catheterization N/A 07/09/2015    Procedure: Right Heart Cath;  Surgeon: Dolores Patty, MD;  Location: [facility] INVASIVE CV LAB;  Service: Cardiovascular;  Laterality: N/A;  . Cardiac catheterization N/A 07/13/2015    Procedure: Left Heart Cath and Cors/Grafts Angiography;  Surgeon: Marykay Lex, MD;  Location: [facility] INVASIVE CV LAB;  Service: Cardiovascular;  Laterality: N/A;  . Peripheral vascular catheterization N/A 07/13/2015    Procedure: Renal Angiography;  Surgeon: Marykay Lex, MD;  Location: [facility] INVASIVE CV LAB;  Service: Cardiovascular;  Laterality: N/A;    Social History   Social History  . Marital Status: Married    Spouse Name: N/A  . Number of Children: N/A  . Years of Education: N/A   Occupational History  . Not on file.   Social History Main Topics  . Smoking status: Former Smoker    Types: Cigarettes    Quit date: 05/31/1993  . Smokeless tobacco: Never Used  . Alcohol Use: 4.2 oz/week    7 Glasses of wine per week     Comment: 4 shots of whiskey per night   . Drug Use: No  . Sexual Activity: Not on file   Other Topics Concern  . Not on file   Social History Narrative   She is a married mother of 4 with one child that died at a young age. She has 11 grandchildren and 11 great grandchildren. She is not excessively active. She does get around the house. No routine exercise   She quit smoking in 1993, and has a social alcohol beverage on occasion.   She is a former Hotel manager working as a Lawyer.    Family History  Problem Relation Age of Onset  . Breast cancer Mother   . Heart attack Father   . Cancer - Cervical Brother     Allergies as of 08/14/2015 - Review Complete 08/14/2015  Allergen Reaction Noted  . Angiotensin receptor blockers  12/15/2013  . Latex Swelling 08/28/2012  . Statins Other (See Comments) 06/02/2013  . Benicar [olmesartan] Itching and Rash 05/31/2013    Current Outpatient Prescriptions on File Prior to Visit  Medication Sig Dispense Refill  . acetaminophen (TYLENOL) 325 MG tablet Take 650 mg by mouth every 6 (six) hours as needed for moderate pain.    Marland Kitchen amiodarone (PACERONE) 200 MG tablet Take 1 tablet (200 mg total) by mouth daily. 400 mg daily for 1 week then decrease to 200 mg daily 100 tablet 3  . betamethasone dipropionate (DIPROLENE) 0.05 % cream Apply 1 application topically 2 (two) times daily as needed. ECZEMA    . clopidogrel (PLAVIX) 75 MG tablet TAKE 1 TABLET (75  MG TOTAL) BY MOUTH DAILY WITH BREAKFAST. 30 tablet 11  . colchicine 0.6 MG tablet Take 0.6 mg by mouth 2 (two) times daily as needed. GOUT FLARE UP.    . febuxostat (ULORIC) 40 MG tablet Take 40 mg by mouth daily.    . hydrALAZINE (APRESOLINE) 10 MG tablet Take 1 tablet (10 mg total) by mouth every 8 (eight) hours. 90 tablet 0  . LORazepam (ATIVAN) 0.5 MG tablet Take 0.5 mg by mouth 4 (four) times daily as needed for anxiety.    . metoprolol (LOPRESSOR) 50 MG tablet Take 1.5 tablets (75 mg total) by mouth 2 (two) times daily. Hold if HR below 50 or if SBP below 110    . Multiple Vitamins-Minerals (ICAPS)  CAPS Take 1 capsule by mouth 2 (two) times daily.    . multivitamin (RENA-VIT) TABS tablet Take 1 tablet by mouth daily.    . Nutritional Supplements (FEEDING SUPPLEMENT, NEPRO CARB STEADY,) LIQD Take 237 mLs by mouth 2 (two) times daily between meals. 60 Can 0  . omeprazole (PRILOSEC) 20 MG capsule Take 20 mg by mouth daily.    Marland Kitchen warfarin (COUMADIN) 5 MG tablet Take 1 tablet (5 mg total) by mouth daily at 6 PM. 30 tablet 0   No current facility-administered medications on file prior to visit.     REVIEW OF SYSTEMS: Cardiovascular: No chest pain, chest pressure, Positive for irregular heartbeat Pulmonary: No productive cough, asthma or wheezing. Neurologic: No weakness, paresthesias, aphasia, or amaurosis. No dizziness. Hematologic: No bleeding problems or clotting disorders. Musculoskeletal: No joint pain or joint swelling. Gastrointestinal: No blood in stool or hematemesis Genitourinary: No dysuria or hematuria. Psychiatric:: No history of major depression. Integumentary: No rashes or ulcers. Constitutional: No fever or chills.  PHYSICAL EXAMINATION:  Filed Vitals:   08/14/15 1623  BP: 138/53  Pulse: 57  Temp: 98.5 F (36.9 C)  TempSrc: Oral  Resp: 14  Height: 5\' 3"  (1.6 m)  Weight: 156 lb 4.8 oz (70.897 kg)  SpO2: 98%   Body mass index is 27.69 kg/(m^2). General: The  patient appears their stated age.   HEENT:  No gross abnormalities Pulmonary: Respirations are non-labored Musculoskeletal: There are no major deformities.   Neurologic: No focal weakness or paresthesias are detected, Skin: There are no ulcer or rashes noted. Psychiatric: The patient has normal affect. Cardiovascular: There is a regular rate and rhythm without significant murmur appreciated.Palpable radial and brachial pulses bilaterally  Diagnostic Studies: I have reviewed her vascular lab studies.  She has excellent left cephalic vein beginning in the distal forearm.  Arterial waveforms are triphasic   Assessment:  End-stage renal disease Plan: I discussed with the patient and her daughter proceeding with a left radiocephalic fistula.  The risks and benefits of the operation were discussed with the patient including the risk of non-maturity, the need for future interventions, and the risk of steal syndrome.  Before proceeding, I will have her evaluated by her cardiologist, Dr. Herbie Baltimore in one week.  She will need to be off of Coumadin and Plavix which I will leave to his discretion.     Jorge Ny, M.D. [facility] Office: [phone] Pager:  [phone]

## 2015-08-14 NOTE — Patient Instructions (Addendum)
Medication Instructions:  1. DECREASE AMIODARONE TO 400 MG DAILY FOR 1 WEEK THEN DECREASE TO 200 MG DAILY 2. HOLD METOPROLOL IS YOUR HEART RATE IS BELOW 50 OR IF YOUR SYSTOLIC BLOOD PRESSURE IS BELOW 110  Labwork: NONE  Testing/Procedures: NONE  Follow-Up: KEEP YOUR APPT UPCOMING WITH DR. HARDING 09/02/15  Any Other Special Instructions Will Be Listed Below (If Applicable). If you need a refill on your cardiac medications before your next appointment, please call your pharmacy.

## 2015-08-14 NOTE — Progress Notes (Addendum)
Cardiology Office Note:    Date:  08/14/2015   ID:  Tanna Savoy, DOB [birth year], MRN [id number]  PCP:  Michiel Sites, MD  Cardiologist:  Dr. Bryan Lemma  PV: Dr. Nanetta Batty   Electrophysiologist:  n/a Nephrologist:  Dr. Eliott Nine  Chief Complaint  Patient presents with  . Hospitalization Follow-up    admx with pneumonia, systolic CHF, AFib, ESRD    History of Present Illness:     [name] is a 80 y.o. female with a hx of CAD s/p CABG, PAD s/p R fem-pop bypass, HTN, HL, CKD, RAS s/p L RA stent 7/15.     Admitted 2/10-2/26 with sepsis secondary to pneumonia complicated by worsening renal function, atrial fibrillation with RVR, elevated Troponin levels and systolic CHF.  Echocardiogram demonstrated newly depressed LV function with an EF of 20-25%. Cardiac catheterization demonstrated patent grafts and no significant lesions to explain her cardiomyopathy (nonischemic cardiomyopathy). Renal arteriography demonstrated patent left renal artery stent and stable right renal artery stenosis. Her renal failure progressed to ESRD. She was started on hemodialysis. She was placed on amiodarone and returned to NSR. She was placed on Coumadin for anticoagulation.  Since DC, she has been doing well. She goes to dialysis Tuesday, Thursday, Saturday. She denies significant dyspnea. She denies chest pain. She denies syncope. She denies orthopnea, PND or edema.  Past Medical History  Diagnosis Date  . CAD in native artery 2007    a. S/p CABG 2007 (LIMA-LAD, SVG-dRCA, SVG-intermediate, SVG-diagonal). b. Nuclear stress test 08/2012: normal, low risk.  . S/P CABG x 4 2007    LIMA-LAD, SVG-Distal RCA, SVG-Ramus, SVG-Diagonal; no echocardiogram done  . PAD (peripheral artery disease) (HCC) 1997    Mild-moderate carotid disease; status post right SFA occlusion with Fem-Below Knee Pop bypass (Dr. Hart Rochester); LEA Dopplers 07/2014: RIGHT - ABI 0.84, CIA/EIA 50-69%, CFA/PFA patent Fem-Pop  bypass patent w/ 70-99% anastomotic stenosis, patent Pop A with 3 V runoff; LABI 0.85- L CIA/EIA patent, LPFA 70-99%, dLSFA 50-69%, patent LPopA & 3 V runoff.  . Renovascular hypertension 12/17/2013    a. s/p L RA Stent 11/2013. b. Renal duplex 02/2015: >60% right proximal renal artery stenosis, normal left renal artery s/p stent, f/u 1 yr recommended.  . CKD (chronic kidney disease), stage III   . Essential hypertension   . Hyperlipidemia LDL goal <70   . Carotid artery disease (HCC)     a. Mild-mod carotid disease per Dr.Berry's note.  . Anemia     Past Surgical History  Procedure Laterality Date  . Cardiac catheterization  December 2007    LAD-90% mid, D19 percent ostial. Circumflex-OM1 90% ostial, 80% mid. RCA 9% proximal, 100% mid  . Femoropopliteal bypass Right 1997    Dr. Hart Rochester: SFA-below knee Pop  . Coronary artery bypass graft  December 2007    LIMA-LAD, SVG-distal RCA, SVG-D1, SVG-OM1/Ramus  . Nm myoview ltd  April 2014    EF 63%, mild fixed anteroapical defect thought to be breast attenuation  . Appendectomy    . Tonsillectomy    . Cataract surgery    . Rectovaginal fistula repair  1997  . [place] bhl  1981  . Renal artery stent Left 12/16/13    using CO2  . Lower extremity venous doppler  06/13/2011    No evidence of thrombus or thrombophlebitis, no venous insuffiency noted.  . Cardiovascular stress test  08/28/2012    Normal stress nuclear study with likely breast attenuation, low risk stress test.  .  Renal angiogram N/A 12/16/2013    Procedure: RENAL ANGIOGRAM;  Surgeon: Runell Gess, MD;  Location: [facility] CATH LAB;  Service: Cardiovascular;  Laterality: N/A;  . Percutaneous stent intervention Left 12/16/2013    Procedure: PERCUTANEOUS STENT INTERVENTION - LEFT RENAL ARTERY;  Surgeon: Runell Gess, MD;  Location: [facility] CATH LAB;  Service: Cardiovascular;  Laterality: Left;  renal  . Cardiac catheterization N/A 07/09/2015    Procedure: Right Heart Cath;  Surgeon: Dolores Patty, MD;  Location: [facility] INVASIVE CV LAB;  Service: Cardiovascular;  Laterality: N/A;  . Cardiac catheterization N/A 07/13/2015    Procedure: Left Heart Cath and Cors/Grafts Angiography;  Surgeon: Marykay Lex, MD;  Location: [facility] INVASIVE CV LAB;  Service: Cardiovascular;  Laterality: N/A;  . Peripheral vascular catheterization N/A 07/13/2015    Procedure: Renal Angiography;  Surgeon: Marykay Lex, MD;  Location: [facility] INVASIVE CV LAB;  Service: Cardiovascular;  Laterality: N/A;    Current Medications: Outpatient Prescriptions Prior to Visit  Medication Sig Dispense Refill  . acetaminophen (TYLENOL) 325 MG tablet Take 650 mg by mouth every 6 (six) hours as needed for moderate pain.    Marland Kitchen betamethasone dipropionate (DIPROLENE) 0.05 % cream Apply 1 application topically 2 (two) times daily as needed. ECZEMA    . clopidogrel (PLAVIX) 75 MG tablet TAKE 1 TABLET (75 MG TOTAL) BY MOUTH DAILY WITH BREAKFAST. 30 tablet 11  . colchicine 0.6 MG tablet Take 0.6 mg by mouth 2 (two) times daily as needed. GOUT FLARE UP.    . febuxostat (ULORIC) 40 MG tablet Take 40 mg by mouth daily.    . hydrALAZINE (APRESOLINE) 10 MG tablet Take 1 tablet (10 mg total) by mouth every 8 (eight) hours. 90 tablet 0  . LORazepam (ATIVAN) 0.5 MG tablet Take 0.5 mg by mouth 4 (four) times daily as needed for anxiety.    . Multiple Vitamins-Minerals (ICAPS) CAPS Take 1 capsule by mouth 2 (two) times daily.    . Nutritional Supplements (FEEDING SUPPLEMENT, NEPRO CARB STEADY,) LIQD Take 237 mLs by mouth 2 (two) times daily between meals. 60 Can 0  . warfarin (COUMADIN) 5 MG tablet Take 1 tablet (5 mg total) by mouth daily at 6 PM. 30 tablet 0  . amiodarone (PACERONE) 400 MG tablet Take 1 tablet (400 mg total) by mouth 2 (two) times daily. 60 tablet 0  . metoprolol (LOPRESSOR) 50 MG tablet Take 1.5 tablets (75 mg total) by mouth 2 (two) times daily. 180 tablet 3  . furosemide (LASIX) 40 MG tablet Take 40 mg by mouth 2 (two) times  daily.    . Multiple Vitamin (MULTIVITAMIN) tablet Take 1 tablet by mouth daily.    . pantoprazole (PROTONIX) 40 MG tablet Take 1 tablet (40 mg total) by mouth daily. 30 tablet 6   No facility-administered medications prior to visit.     Allergies:   Angiotensin receptor blockers; Latex; Statins; and Benicar   Social History   Social History  . Marital Status: Married    Spouse Name: N/A  . Number of Children: N/A  . Years of Education: N/A   Social History Main Topics  . Smoking status: Former Smoker    Types: Cigarettes    Quit date: 05/31/1993  . Smokeless tobacco: Never Used  . Alcohol Use: 4.2 oz/week    7 Glasses of wine per week     Comment: 4 shots of whiskey per night   . Drug Use: No  . Sexual Activity: Not  Asked   Other Topics Concern  . None   Social History Narrative   She is a married mother of 4 with one child that died at a young age. She has 11 grandchildren and 11 great grandchildren. She is not excessively active. She does get around the house. No routine exercise   She quit smoking in 1993, and has a social alcohol beverage on occasion.   She is a former Hotel manager working as a Lawyer.     Family History:  The patient's family history includes Breast cancer in her mother; Cancer - Cervical in her brother; Heart attack in her father.   ROS:   Please see the history of present illness.    Review of Systems  Respiratory: Positive for cough.   Hematologic/Lymphatic: Bruises/bleeds easily.  Psychiatric/Behavioral: The patient is nervous/anxious.   All other systems reviewed and are negative.   Physical Exam:    VS:  BP 130/74 mmHg  Pulse 48  Ht 5\' 3"  (1.6 m)  Wt 155 lb (70.308 kg)  BMI 27.46 kg/m2   GEN: Well nourished, well developed, in no acute distress HEENT: normal Neck: no JVD, no masses Cardiac: Normal S1/S2,  RRR; no murmurs, rubs, or gallops, no edema;     Respiratory:  clear to auscultation bilaterally; no  wheezing, rhonchi or rales GI: soft, nontender, nondistended MS: no deformity or atrophy Skin: warm and dry Neuro: No focal deficits  Psych: Alert and oriented x 3, normal affect  Wt Readings from Last 3 Encounters:  08/14/15 155 lb (70.308 kg)  07/19/15 158 lb 4.6 oz (71.8 kg)  03/10/15 173 lb 14.4 oz (78.881 kg)      Studies/Labs Reviewed:     EKG:  EKG is  ordered today.  The ekg ordered today demonstrates Sinus brady, HR 54, LAD, 1st degree AVB, PR 330 ms, QRS 120 ms, QTc 523 ms  Recent Labs: 07/03/2015: B Natriuretic Peptide 4223.3* 07/07/2015: TSH 0.922 07/11/2015: Magnesium 2.1 07/16/2015: ALT 101* 07/18/2015: BUN 44*; Creatinine, Ser 4.04*; Potassium 4.3; Sodium 137 07/19/2015: Hemoglobin 9.7*; Platelets 99*   Recent Lipid Panel No results found for: CHOL, TRIG, HDL, CHOLHDL, VLDL, LDLCALC, LDLDIRECT  Additional studies/ records that were reviewed today include:   LHC 07/13/15 LAD mid 60%, distal 70%, D1 ostial 99% RI 100%-CTO LCx okay RCA ostial 100% SVG-RCA normal SVG-RI normal LIMA-LAD normal SVG-D1 mid 50% R RA proximal 70-75% L RA proximal stent patent with 10% ISR No obvious culprit lesion for the patient's cardiomyopathy. Suspect this is now nonischemic cardiomyopathy with atrial fibrillation are potential culprit. Another possibility is volume overload from renal insufficiency.  RHC 07/09/15 RA 7, RV 37/1/5, PA 36/17, PCW 26  Echo 07/05/15 Mild focal basal septal hypertrophy, EF 20-25%, diffuse HK, anteroseptal and inferolateral akinesis, mild AI, MAC, moderate MR, severe LAE, moderately reduced RVSF, mild RAE  Renal artery duplex 10/16 Technically challenging study. Dilated distal aorta measuring 1.8 cm x 2.2 cm.  Normal and stable bilateral kidney size. >60% right proximal renal artery stenosis. Normal left renal artery, s/p stent placement. No evidence of hydronephrosis is noted in the kidneys. The IVC and renal veins are patent. f/u 1  year.  Myoview 4/14 Breast attenuation, no ischemia, low risk   ASSESSMENT:     1. CAD in native artery - CABG x4 (LIMA-LAD, SVG-distal RCA, SVG-diagonal, SVG-Ramus   2. Chronic systolic CHF (congestive heart failure) (HCC)   3. NICM (nonischemic cardiomyopathy) (HCC)   4. Hypertension, essential  5. Hyperlipidemia with target LDL less than 70   6. RAS (renal artery stenosis) (HCC)   7. ESRD (end stage renal disease) (HCC)   8. PAF (paroxysmal atrial fibrillation) (HCC)   9. On amiodarone therapy     PLAN:     In order of problems listed above:  1. CAD - s/p CABG.  Recent admission to the hospital with sepsis, acute systolic CHF, progression to ESRD and AF with RVR.  Cardiac catheterization demonstrated patent bypass grafts. Medical therapy was continued. She denies symptoms of angina. She has been intolerant to statins. PCSK-9 inhibitor was held due to worsening LFTs.  Continue Plavix, beta blocker.  2. Chronic systolic CHF - She is NYHA 2-2b. Volume is managed by hemodialysis.  3. Nonischemic cardiomyopathy - Question if tachy mediated CM.  Continue beta blocker, hydralazine. She has been holding her metoprolol at times. I have changed her parameters for holding to hopefully get her to take more of this. Consider addition of isosorbide at follow-up if blood pressure remains stable. At some point, she will need repeat echocardiogram to reassess LV function.  4. HTN - Controlled.  5. HL - Her PCSK-9 inhibitor was held during her recent admit due to worsening LFTs.  PCP recently drew labs.  I will forward my note to our pharmacist to see if it is ok for her to resume.  6. RAS - L RA stent patent during hospital stay.  7. ESRD - She is on Tues, Thurs, Sat dialysis.    8. PAF - Remains in NSR.  She is on a high dose of Amiodarone.  I have asked her to take 400 QD x 1 week, then reduce this to 200 mg QD.  Continue Coumadin.  I will let our pharmacist know that we are changing her  Amiodarone dose.  CHADS2-VASc=6 (female, HTN, vascular dz, age, CHF).   9. Amiodarone Rx - She was placed on Amiodarone in the hospital.  AFib occurred in the context of sepsis and acute illness.  Question if Amiodarone could eventually be DC'd.  If we decide to keep her on this long term, she will need PFTs and routine FU on LFTs and TSH.    I spent a total of 45 minutes with the patient today (reviewing records, evaluating the patient and coordinating care).  Face to face time > 50%.   Medication Adjustments/Labs and Tests Ordered: Current medicines are reviewed at length with the patient today.  Concerns regarding medicines are outlined above.  Medication changes, Labs and Tests ordered today are outlined in the Patient Instructions noted below. Patient Instructions  Medication Instructions:  1. DECREASE AMIODARONE TO 400 MG DAILY FOR 1 WEEK THEN DECREASE TO 200 MG DAILY 2. HOLD METOPROLOL IS YOUR HEART RATE IS BELOW 50 OR IF YOUR SYSTOLIC BLOOD PRESSURE IS BELOW 110  Labwork: NONE  Testing/Procedures: NONE  Follow-Up: KEEP YOUR APPT UPCOMING WITH DR. HARDING 09/02/15  Any Other Special Instructions Will Be Listed Below (If Applicable). If you need a refill on your cardiac medications before your next appointment, please call your pharmacy.    Signed, [name], PA-C  08/14/2015 1:12 PM    [facility] [address] [place], [place], [place]  [address] Phone: [phone]; Fax: [phone]

## 2015-08-17 ENCOUNTER — Ambulatory Visit (INDEPENDENT_AMBULATORY_CARE_PROVIDER_SITE_OTHER): Payer: Medicare Other | Admitting: Pharmacist Clinician (PhC)/ Clinical Pharmacy Specialist

## 2015-08-17 DIAGNOSIS — Z5181 Encounter for therapeutic drug level monitoring: Secondary | ICD-10-CM

## 2015-08-17 DIAGNOSIS — I481 Persistent atrial fibrillation: Secondary | ICD-10-CM

## 2015-08-17 DIAGNOSIS — I4819 Other persistent atrial fibrillation: Secondary | ICD-10-CM

## 2015-08-17 LAB — POCT INR: INR: 3.1

## 2015-08-17 MED ORDER — WARFARIN SODIUM 2.5 MG PO TABS: ORAL_TABLET | ORAL | Status: DC

## 2015-08-19 ENCOUNTER — Telehealth: Payer: Self-pay | Admitting: Physician Assistant

## 2015-08-19 NOTE — Telephone Encounter (Signed)
Forward to Dr Herbie Baltimore  ( DR Allyson Sabal out of the office )

## 2015-08-19 NOTE — Telephone Encounter (Signed)
New message   [name] is calling for surgical clearance STAT and she want to use Scott's last visit if possible,  Pt has appt with harding but its too late of a date  Request for surgical clearance:  What type of surgery is being performed? LEFT ARM FISTULA  1. When is this surgery scheduled? UNKNOWN  2. Are there any medications that need to be held prior to surgery and how long? Plavix and Coumadin until after surgery  3. Name of physician performing surgery? Dr. Myra Gianotti  4. What is your office phone and fax number? [phone]

## 2015-08-19 NOTE — Progress Notes (Signed)
I reviewed her case with one of our clinical pharmacists. Recommendation is to repeat her LFTs in 1 month. If her LFTs are back to normal, her Praluent can be resumed. The LFTs should be rechecked 1 month after resuming Praluent to ensure they remain stable. Her PCP prescribes her Praluent.  I will fax my OV note to her PCP. The patient will be notified to contact her PCP for further management.  [name], PA-C   08/19/2015 9:38 AM

## 2015-08-19 NOTE — Telephone Encounter (Signed)
Please tell patient I reviewed with Pharmacist. She should have her LFTs rechecked by her PCP in 1 month. If her LFTs are normal, her PCP can resume Praluent. After resuming Praluent, she should have her LFTs repeated 1 month later to ensure they are stable. I will addend my OV note as well.  Please fax to her PCP. She should contact her PCP to arrange labs, med resumption. [name], PA-C   08/19/2015 9:36 AM

## 2015-08-19 NOTE — Telephone Encounter (Signed)
Will refer to NL. Pt of Dr. Lavonia Dana

## 2015-08-20 ENCOUNTER — Other Ambulatory Visit: Payer: Self-pay

## 2015-08-20 NOTE — Telephone Encounter (Signed)
I s/w pt today who has been made aware of PA discussion w/Pharm D about Praluent. Need to have PCP check LFT in 1 month; if normal then resume Praluent w/repeat LFT 1 month after resuming med. Pt also states having fistula implanted 4/12 and needs to reschedule Dr. Herbie Baltimore appt on 4/12. Pt asked for me to please call her daughter Melina Schools to tell her all of this as well and to reschedule Dr. Herbie Baltimore; pt states she was at dialysis right now. No official DPR on file ok to s/w daughter Melina Schools however pt gave me permission today. Melina Schools has now been notified as well of plan of care for pt and will expect a call from [facility] about rescheduling Dr. Herbie Baltimore appt. I will send a message to [facility] to call Melina Schools [phone] to reschedule Dr. Herbie Baltimore appt. Both pt and her daughter Melina Schools verbalized understanding to all instructions of plan of care. I will also fax Lorin Picket W. PA note to DR. Kohut. Pt and daughter both aware to contact PCP about getting lab work done as stated per [employer]. PAC .

## 2015-08-20 NOTE — Telephone Encounter (Signed)
I s/w pt today who has been made aware of PA discussion w/Pharm D about Praluent. Need to have PCP check LFT in 1 month; if normal then resume Praluent w/repeat LFT 1 month after resuming med. Pt also states having fistula implanted 4/12 and needs to reschedule Dr. Harding appt on 4/12. Pt asked for me to please call her daughter Robyn to tell her all of this as well and to reschedule Dr. Harding; pt states she was at dialysis right now. No official DPR on file ok to s/w daughter Robyn however pt gave me permission today. Robyn has now been notified as well of plan of care for pt and will expect a call from [facility] about rescheduling Dr. Harding appt. I will send a message to [facility] to call Robyn [phone] to reschedule Dr. Harding appt. Both pt and her daughter Robyn verbalized understanding to all instructions of plan of care. I will also fax Scott W. PA note to DR. Kohut. Pt and daughter both aware to contact PCP about getting lab work done as stated per Scott W. PAC .  

## 2015-08-21 ENCOUNTER — Telehealth: Payer: Self-pay | Admitting: *Deleted

## 2015-08-21 DIAGNOSIS — I4819 Other persistent atrial fibrillation: Secondary | ICD-10-CM

## 2015-08-21 DIAGNOSIS — I428 Other cardiomyopathies: Secondary | ICD-10-CM

## 2015-08-21 DIAGNOSIS — I5022 Chronic systolic (congestive) heart failure: Secondary | ICD-10-CM

## 2015-08-21 NOTE — Telephone Encounter (Signed)
SPOKE TO [name] at [facility] ( Dr Estanislado Spire office) Information cleared for surgery    patient will hold plavix and coumadin 5 days prior to surgery if okay with Dr Herbie Baltimore Information was not address earlier. Will send information to Dr Herbie Baltimore- does patient need to be on aspirin while off the above medications ,and if the timeframe okay.

## 2015-08-21 NOTE — Telephone Encounter (Signed)
FORWARD TO Charlsie Merles RN

## 2015-08-21 NOTE — Telephone Encounter (Signed)
-----   Message from Gerome Sam sent at 08/20/2015  2:35 PM EDT ----- Regarding: FW: DR. HARDING APPT NEEDS TO BE RESCHEDULED Jasmine December,  This patients daughter wants an appoint.earlier than the one I reschedule her mother for.(09/10/15).Please have someone to give her a call. Thanks ----- Message -----    From: Tarri Fuller, CMA    Sent: 08/20/2015  12:56 PM      To: [facility] Subject: DR. HARDING APPT NEEDS TO BE RESCHEDULED       Hi ladies,  This pt's appt with Dr. Herbie Baltimore on 09/02/15 will need to be rescheduled due to pt will be having a fistula implanted on 09/02/15. Pt would like to see Dr. Herbie Baltimore before 4/12 if possible. Please call the pt's daughter Melina Schools [phone] to reschedule Dr. Herbie Baltimore appt.  Thank you Okey Regal

## 2015-08-21 NOTE — Telephone Encounter (Signed)
Spoke to PATIENT  Informed patient- per Dr Herbie Baltimore OKAY TO HAVE FISTULA SURGERY prior to office appointment on 09/10/15 Dr Herbie Baltimore would like for patient to have echo done

## 2015-08-21 NOTE — Telephone Encounter (Signed)
  Simply based upon her CAD, CHF & ESRD - would be HIGH Risk for any surgery.  However, these are all relatively stable & the surgery is Low Risk.  Based upon Mr. Wende Mott note, she should be fine for fistula surgery.  It would be nice to have a re-look Echo done as part of Risk Stratification.  She does not necessarily need to see me prior to her Fistula operation.   Marykay Lex, MD

## 2015-08-21 NOTE — Telephone Encounter (Signed)
If OK to be on ASA by Vasc Sgx -would be good. Off Coumadin & Plavix x 5 days  Labaron Digirolamo, Piedad Climes, MD

## 2015-08-24 ENCOUNTER — Ambulatory Visit (INDEPENDENT_AMBULATORY_CARE_PROVIDER_SITE_OTHER): Payer: Medicare Other | Admitting: Pharmacist Clinician (PhC)/ Clinical Pharmacy Specialist

## 2015-08-24 ENCOUNTER — Telehealth: Payer: Self-pay

## 2015-08-24 DIAGNOSIS — I4819 Other persistent atrial fibrillation: Secondary | ICD-10-CM

## 2015-08-24 DIAGNOSIS — Z5181 Encounter for therapeutic drug level monitoring: Secondary | ICD-10-CM

## 2015-08-24 DIAGNOSIS — I481 Persistent atrial fibrillation: Secondary | ICD-10-CM

## 2015-08-24 LAB — POCT INR: INR: 2.2

## 2015-08-24 NOTE — Telephone Encounter (Signed)
Informed patient's daughter, Melina Schools, the request from Dr. Herbie Baltimore regarding anticoagulation therapy. Instructed that Ms. Bewick hold Plavix and Coumadin 5 days prior to procedure (last dose on 08/27/15) and start daily Aspirin 81mg  on 08/28/15. Robyn verbalized understanding.

## 2015-08-26 ENCOUNTER — Other Ambulatory Visit: Payer: Self-pay

## 2015-08-26 ENCOUNTER — Ambulatory Visit (HOSPITAL_COMMUNITY): Payer: Medicare Other | Attending: Cardiology

## 2015-08-26 DIAGNOSIS — I4819 Other persistent atrial fibrillation: Secondary | ICD-10-CM

## 2015-08-26 DIAGNOSIS — I481 Persistent atrial fibrillation: Secondary | ICD-10-CM | POA: Diagnosis not present

## 2015-08-26 DIAGNOSIS — I351 Nonrheumatic aortic (valve) insufficiency: Secondary | ICD-10-CM | POA: Insufficient documentation

## 2015-08-26 DIAGNOSIS — I340 Nonrheumatic mitral (valve) insufficiency: Secondary | ICD-10-CM | POA: Diagnosis not present

## 2015-08-26 DIAGNOSIS — I739 Peripheral vascular disease, unspecified: Secondary | ICD-10-CM | POA: Diagnosis not present

## 2015-08-26 DIAGNOSIS — I429 Cardiomyopathy, unspecified: Secondary | ICD-10-CM

## 2015-08-26 DIAGNOSIS — Z87891 Personal history of nicotine dependence: Secondary | ICD-10-CM | POA: Diagnosis not present

## 2015-08-26 DIAGNOSIS — E785 Hyperlipidemia, unspecified: Secondary | ICD-10-CM | POA: Insufficient documentation

## 2015-08-26 DIAGNOSIS — N186 End stage renal disease: Secondary | ICD-10-CM | POA: Insufficient documentation

## 2015-08-26 DIAGNOSIS — Z8249 Family history of ischemic heart disease and other diseases of the circulatory system: Secondary | ICD-10-CM | POA: Diagnosis not present

## 2015-08-26 DIAGNOSIS — I428 Other cardiomyopathies: Secondary | ICD-10-CM

## 2015-08-26 DIAGNOSIS — I132 Hypertensive heart and chronic kidney disease with heart failure and with stage 5 chronic kidney disease, or end stage renal disease: Secondary | ICD-10-CM | POA: Diagnosis not present

## 2015-08-26 DIAGNOSIS — I071 Rheumatic tricuspid insufficiency: Secondary | ICD-10-CM | POA: Diagnosis not present

## 2015-08-26 DIAGNOSIS — I5022 Chronic systolic (congestive) heart failure: Secondary | ICD-10-CM | POA: Insufficient documentation

## 2015-08-26 DIAGNOSIS — Z951 Presence of aortocoronary bypass graft: Secondary | ICD-10-CM | POA: Insufficient documentation

## 2015-08-26 HISTORY — PX: TRANSTHORACIC ECHOCARDIOGRAM: SHX275

## 2015-08-26 NOTE — Progress Notes (Signed)
Quick Note:  [employer]. Pump function is now back up to 45-50% which is significantly improved from the 20th and 25% during hospital stay. He out of A. fib is definitely help. The wall motion still show some anteroseptal hypokinesis which I think is consistent with a known anatomy. The heart is stiff and there still some diastolic dysfunction. There is still evidence of high filling pressures indicating that you need to be really good about keeping her volumes-down with dialysis -- for venous standpoint she seems to be euvolemic however. She has some moderate evidence of aortic valve insufficiency. This would deathly worsen her function in the setting of A. fib RVR. We need to work on afterload reduction.  H was very dilated which can explain why it is difficult control her A. Fib.  Overall this puts her much less risk for any potential operation than she was with previous echo. It is good to get the echo.  Marykay Lex, MD  ______

## 2015-08-27 ENCOUNTER — Encounter (HOSPITAL_COMMUNITY): Payer: Self-pay | Admitting: *Deleted

## 2015-08-27 NOTE — Progress Notes (Signed)
Pt denies any acute cardiopulmonary issues. Pt stated that she is under the care of Dr. Herbie Baltimore, Cardiology. Pt consented for daughter, Melina Schools to be provided with pre- op instructions as well, since she is involved with her care. According to [facility], pt is to take last dose of Plavix and Coumadin tonight. Pt stated,  " I take Aspirin at 6 PM to substitute for the Coumadin." Robyn and pt both made aware to stop otc vitamins, fish oil, herbal medications and NSAID's. Both pt and daughter, Melina Schools, verbalized understanding of pre-op instructions. Anesthesia, asked to review pt history.

## 2015-08-28 ENCOUNTER — Telehealth: Payer: Self-pay | Admitting: *Deleted

## 2015-08-28 ENCOUNTER — Encounter (HOSPITAL_COMMUNITY): Payer: Self-pay | Admitting: Vascular Surgery

## 2015-08-28 NOTE — Telephone Encounter (Signed)
Left message to call back in regards to echo 

## 2015-08-28 NOTE — Telephone Encounter (Signed)
-----   Message from Marykay Lex, MD sent at 08/26/2015 11:10 PM EDT ----- Randie Heinz news. Pump function is now back up to 45-50% which is significantly improved from the 20th and 25% during hospital stay. He out of A. fib is definitely help. The wall motion still show some anteroseptal hypokinesis which I think is consistent with a known anatomy. The heart is stiff and there still some diastolic dysfunction. There is still evidence of high filling pressures indicating that you need to be really good about keeping her volumes-down with dialysis -- for venous standpoint she seems to be euvolemic however. She has some moderate evidence of aortic valve insufficiency. This would deathly worsen her function in the setting of A. fib RVR.  We need to work on afterload reduction.  H was very dilated which can explain why it is difficult control her A. Fib.  Overall this puts her much less risk for any potential operation than she was with previous echo. It is good to get the echo.  Marykay Lex, MD

## 2015-08-28 NOTE — Telephone Encounter (Signed)
Spoke to patient. Result given . Verbalized understanding KEEP APPOINTMENT 09/10/15

## 2015-08-28 NOTE — Progress Notes (Signed)
Anesthesia Chart Review: SAME DAY WORK-UP.  Patient is a 80 year old female scheduled for creation of LUE AVF on 09/02/15 by Dr. Hart Rochester. She recently was started on hemodialysis during a 06/2015 admission for sepsis secondary to PNA complicated by worsening renal function, afib with RVE, elevated troponins and systolic CHF with newly depressed EF of 20-25% with cath revealing patient grafts and no significant lesions to explain her cardiomyopathy (non-ischemic cardiomyopathy, possible tachy mediated or from volume overload, but also history of ETOH abuse with reported last use 07/03/15). A catheter was placed then and now she needs permanent HD access.   Other history includes ESRD (HD on TTS), former smoker, MI, CAD s/p CABG '07 (LIMA-LAD, SVG-dRCA, SVG-Intermediate, SVG-DIAG), CHF, afib, PAD s/p right FPBG '97, mild to moderate carotid occlusive disease, left renal artery stenosis s/p stent '15, HTN, HLD, anemia, GERD, hysterectomy, former smoker.   PCP is listed as Dr. Darci Needle. Cardiologist is Dr. Bryan Lemma. PV cardiologist is Dr. Nanetta Batty. Nephrologist is Dr. Camille Bal.  Meds include amiodarone, Plavix, colchicine, Uloric, hydralazine, Ativan, Lopressor, Rena-Vit, Prilosec, warfarin. She reported being instructed to hold warfarin and Plavix starting 08/27/15 and start ASA 81 mg on 08/28/15 (see notation by Dr. Herbie Baltimore on 08/21/15 and Garlan Fair, RN on 08/24/15).  08/26/15 Echo: Study Conclusions - Left ventricle: The cavity size was normal. There was mild  concentric hypertrophy. Systolic function was mildly reduced. The  estimated ejection fraction was in the range of 45% to 50%.  Diffuse hypokinesis. Hypokinesis of the anteroseptal myocardium.  Features are consistent with a pseudonormal left ventricular  filling pattern, with concomitant abnormal relaxation and  increased filling pressure (grade 2 diastolic dysfunction).  Doppler parameters are consistent with high ventricular  filling  pressure. - Aortic valve: Transvalvular velocity was within the normal range.  There was no stenosis. There was moderate regurgitation.  Regurgitation pressure half-time: 321 ms. - Mitral valve: Transvalvular velocity was within the normal range.  There was no evidence for stenosis. There was mild regurgitation. - Left atrium: The atrium was severely dilated. - Right ventricle: The cavity size was mildly dilated. Wall  thickness was normal. Systolic function was normal. - Atrial septum: The septum bowed from left to right, consistent  with increased left atrial pressure. - Tricuspid valve: There was mild regurgitation. - Inferior vena cava: The vessel was normal in size. The  respirophasic diameter changes were in the normal range (= 50%),  consistent with normal central venous pressure. - Global longitudinal strain abnormal: -14.4%. Impressions: - Compared with the study 07/05/15, LVEF has significantly improved (previously 20-25%).  07/13/15 LHC: Conclusions: 1. Ramus lesion, 100% stenosed. 2. Ost RCA to Mid RCA lesion, 100% stenosed. 3. SVG-RPAV is large, and is anatomically normal. 4. SVG-Ramus Intermedius is large, and is anatomically normal. 5. Mid LAD lesion, 60% stenosed. Dist LAD lesion, 70% stenosed. 6. LIMA is normal in caliber, and is anatomically normal. 7. Ost 1st Diag to 1st Diag lesion, 99% stenosed. 8. SVG-D1 is moderate in size. Mid Graft to Dist Graft lesion, 50% stenosed. 9. Severely elevated LVEDP of 26 mmHg 10. Stable bilateral renal arteries. Patent left renal artery stent with mild in-stent restenosis (~ 10%). Proximal right renal artery stenosis stable at roughly 70-75%. Similar to prior imaging. No obvious culprit lesion for the patient's cardiomyopathy. Suspect this is now nonischemic cardiomyopathy with atrial fibrillation are potential culprit. Another possibility is volume overload from renal insufficiency. Plan:  Return to nursing unit  after sheath removal.  She will need additional volume removal and dialysis as well as afterload reduction medication titration  She will be able to start warfarin tonight with heparin bridging  Plan would be therapeutic heparin while undergoing initiation of dialysis  Potential best option would be inpatient TEE guided cardioversion to reestablish sinus rhythm  Based on severity of LV dysfunction would continue amiodarone.  07/09/15 RHC:  Assessment: 1. Moderately elevated filling pressure with moderately depressed cardiac output 2. Progressive renal failure, likely due in part to cardi-renal syndrome Plan/Discussion: RHC cath results as above. Successful placement of Trialysis catheter. Agree with proceeding with CVVHD. Can consider inotrope support as needed. Timing of coronary angio per Dr. Herbie Baltimore.   08/14/15 EKG: SB at 54 bpm, sinus arrhythmia, first degree AVB, LAD, LVH with QRS widening and repolarization abnormality, possible lateral infarct (age undetermined).  05/15/07 Carotid U/S: IMPRESSION: 1. The right internal carotid artery shows evidence of 40-59% stenosis  (low end of range). 2. The left internal carotid artery shows evidence of 20-39% stenosis  (high end of range). 3. Left external carotid artery stenosis. 4. No significant changes from previous study on April 25, 2006.  07/12/15 CXR: IMPRESSION: Mild congestive heart failure, stable. No consolidative airspace disease.  She is for labs on arrival. Further evaluation by her surgeon and anesthesiologist at that time to ensure no acute changes prior to proceeding.  Velna Ochs [facility] Short Stay Center/Anesthesiology Phone [phone] 08/28/2015 11:17 AM

## 2015-08-31 ENCOUNTER — Other Ambulatory Visit: Payer: Self-pay | Admitting: *Deleted

## 2015-08-31 MED ORDER — HYDRALAZINE HCL 10 MG PO TABS: 10.0000 mg | ORAL_TABLET | Freq: Three times a day (TID) | ORAL | Status: DC

## 2015-09-01 MED ORDER — DEXTROSE 5 % IV SOLN
1.5000 g | INTRAVENOUS | Status: AC
  Administered 2015-09-02: 1.5 g via INTRAVENOUS
  Filled 2015-09-01: qty 1.5

## 2015-09-01 MED ORDER — SODIUM CHLORIDE 0.9 % IV SOLN
INTRAVENOUS | Status: DC
  Administered 2015-09-02: 09:00:00 via INTRAVENOUS

## 2015-09-02 ENCOUNTER — Ambulatory Visit: Payer: Medicare Other | Admitting: Cardiology

## 2015-09-02 ENCOUNTER — Ambulatory Visit (HOSPITAL_COMMUNITY)
Admission: RE | Admit: 2015-09-02 | Discharge: 2015-09-02 | Disposition: A | Discharge status: Home / Self Care | Payer: Medicare Other | Source: Ambulatory Visit | Attending: Vascular Surgery | Admitting: Vascular Surgery

## 2015-09-02 ENCOUNTER — Other Ambulatory Visit: Payer: Self-pay | Admitting: *Deleted

## 2015-09-02 ENCOUNTER — Ambulatory Visit (HOSPITAL_COMMUNITY): Payer: Medicare Other | Admitting: Vascular Surgery

## 2015-09-02 ENCOUNTER — Encounter (HOSPITAL_COMMUNITY): Payer: Self-pay | Admitting: General Practice

## 2015-09-02 ENCOUNTER — Encounter (HOSPITAL_COMMUNITY)
Admission: RE | Disposition: A | Discharge status: Home / Self Care | Payer: Self-pay | Source: Ambulatory Visit | Attending: Vascular Surgery

## 2015-09-02 DIAGNOSIS — I120 Hypertensive chronic kidney disease with stage 5 chronic kidney disease or end stage renal disease: Secondary | ICD-10-CM | POA: Diagnosis present

## 2015-09-02 DIAGNOSIS — Z87891 Personal history of nicotine dependence: Secondary | ICD-10-CM | POA: Diagnosis not present

## 2015-09-02 DIAGNOSIS — Z7902 Long term (current) use of antithrombotics/antiplatelets: Secondary | ICD-10-CM | POA: Insufficient documentation

## 2015-09-02 DIAGNOSIS — E785 Hyperlipidemia, unspecified: Secondary | ICD-10-CM | POA: Diagnosis not present

## 2015-09-02 DIAGNOSIS — Z955 Presence of coronary angioplasty implant and graft: Secondary | ICD-10-CM | POA: Diagnosis not present

## 2015-09-02 DIAGNOSIS — I130 Hypertensive heart and chronic kidney disease with heart failure and stage 1 through stage 4 chronic kidney disease, or unspecified chronic kidney disease: Secondary | ICD-10-CM | POA: Diagnosis not present

## 2015-09-02 DIAGNOSIS — Z79899 Other long term (current) drug therapy: Secondary | ICD-10-CM | POA: Insufficient documentation

## 2015-09-02 DIAGNOSIS — N186 End stage renal disease: Secondary | ICD-10-CM | POA: Insufficient documentation

## 2015-09-02 DIAGNOSIS — N185 Chronic kidney disease, stage 5: Secondary | ICD-10-CM | POA: Diagnosis not present

## 2015-09-02 DIAGNOSIS — I252 Old myocardial infarction: Secondary | ICD-10-CM | POA: Diagnosis not present

## 2015-09-02 DIAGNOSIS — I2510 Atherosclerotic heart disease of native coronary artery without angina pectoris: Secondary | ICD-10-CM | POA: Diagnosis not present

## 2015-09-02 DIAGNOSIS — Z7901 Long term (current) use of anticoagulants: Secondary | ICD-10-CM | POA: Diagnosis not present

## 2015-09-02 DIAGNOSIS — I5022 Chronic systolic (congestive) heart failure: Secondary | ICD-10-CM | POA: Diagnosis not present

## 2015-09-02 DIAGNOSIS — Z4931 Encounter for adequacy testing for hemodialysis: Secondary | ICD-10-CM

## 2015-09-02 HISTORY — DX: Acute myocardial infarction, unspecified: I21.9

## 2015-09-02 HISTORY — DX: Heart failure, unspecified: I50.9

## 2015-09-02 HISTORY — DX: Pneumonia, unspecified organism: J18.9

## 2015-09-02 HISTORY — DX: Unspecified atrial fibrillation: I48.91

## 2015-09-02 HISTORY — PX: AV FISTULA PLACEMENT: SHX1204

## 2015-09-02 HISTORY — DX: Gastro-esophageal reflux disease without esophagitis: K21.9

## 2015-09-02 LAB — POCT I-STAT 4, (NA,K, GLUC, HGB,HCT)
Glucose, Bld: 84 mg/dL (ref 65–99)
HCT: 18 % — ABNORMAL LOW (ref 36.0–46.0)
Hemoglobin: 6.1 g/dL — CL (ref 12.0–15.0)
POTASSIUM: 3.1 mmol/L — AB (ref 3.5–5.1)
SODIUM: 136 mmol/L (ref 135–145)

## 2015-09-02 LAB — APTT: APTT: 30 s (ref 24–37)

## 2015-09-02 LAB — PROTIME-INR
INR: 1.24 (ref 0.00–1.49)
PROTHROMBIN TIME: 15.8 s — AB (ref 11.6–15.2)

## 2015-09-02 SURGERY — ARTERIOVENOUS (AV) FISTULA CREATION
Anesthesia: Monitor Anesthesia Care | Site: Arm Lower | Laterality: Left

## 2015-09-02 MED ORDER — LIDOCAINE-EPINEPHRINE (PF) 1 %-1:200000 IJ SOLN
INTRAMUSCULAR | Status: DC | PRN
  Administered 2015-09-02: 21 mL

## 2015-09-02 MED ORDER — FENTANYL CITRATE (PF) 100 MCG/2ML IJ SOLN: 25.0000 ug | INTRAMUSCULAR | Status: DC | PRN

## 2015-09-02 MED ORDER — CHLORHEXIDINE GLUCONATE CLOTH 2 % EX PADS: 6.0000 | MEDICATED_PAD | Freq: Once | CUTANEOUS | Status: DC

## 2015-09-02 MED ORDER — PROPOFOL 10 MG/ML IV BOLUS
INTRAVENOUS | Status: AC
  Filled 2015-09-02: qty 20

## 2015-09-02 MED ORDER — FENTANYL CITRATE (PF) 250 MCG/5ML IJ SOLN
INTRAMUSCULAR | Status: AC
Start: 2015-09-02 — End: 2015-09-02
  Filled 2015-09-02: qty 5

## 2015-09-02 MED ORDER — WARFARIN SODIUM 2.5 MG PO TABS: 2.5000 mg | ORAL_TABLET | Freq: Every day | ORAL | Status: AC

## 2015-09-02 MED ORDER — ONDANSETRON HCL 4 MG/2ML IJ SOLN
INTRAMUSCULAR | Status: DC | PRN
  Administered 2015-09-02: 4 mg via INTRAVENOUS

## 2015-09-02 MED ORDER — MIDAZOLAM HCL 2 MG/2ML IJ SOLN
INTRAMUSCULAR | Status: AC
  Filled 2015-09-02: qty 2

## 2015-09-02 MED ORDER — DEXMEDETOMIDINE HCL IN NACL 200 MCG/50ML IV SOLN
INTRAVENOUS | Status: DC | PRN
  Administered 2015-09-02: .5 ug/kg/h via INTRAVENOUS

## 2015-09-02 MED ORDER — OXYCODONE HCL 5 MG/5ML PO SOLN: 5.0000 mg | Freq: Once | ORAL | Status: DC | PRN

## 2015-09-02 MED ORDER — FENTANYL CITRATE (PF) 100 MCG/2ML IJ SOLN
INTRAMUSCULAR | Status: DC | PRN
  Administered 2015-09-02: 50 ug via INTRAVENOUS

## 2015-09-02 MED ORDER — 0.9 % SODIUM CHLORIDE (POUR BTL) OPTIME
TOPICAL | Status: DC | PRN
  Administered 2015-09-02: 1000 mL

## 2015-09-02 MED ORDER — MIDAZOLAM HCL 5 MG/5ML IJ SOLN
INTRAMUSCULAR | Status: DC | PRN
  Administered 2015-09-02: 2 mg via INTRAVENOUS

## 2015-09-02 MED ORDER — PROPOFOL 10 MG/ML IV BOLUS
INTRAVENOUS | Status: DC | PRN
  Administered 2015-09-02: 20 mg via INTRAVENOUS

## 2015-09-02 MED ORDER — LIDOCAINE-EPINEPHRINE (PF) 1 %-1:200000 IJ SOLN
INTRAMUSCULAR | Status: AC
  Filled 2015-09-02: qty 30

## 2015-09-02 MED ORDER — SODIUM CHLORIDE 0.9 % IV SOLN
INTRAVENOUS | Status: DC | PRN
  Administered 2015-09-02: 10:00:00 via INTRAVENOUS

## 2015-09-02 MED ORDER — ONDANSETRON HCL 4 MG/2ML IJ SOLN
4.0000 mg | Freq: Once | INTRAMUSCULAR | Status: DC | PRN
Start: 2015-09-02 — End: 2015-09-02

## 2015-09-02 MED ORDER — OXYCODONE HCL 5 MG PO TABS: 5.0000 mg | ORAL_TABLET | Freq: Once | ORAL | Status: DC | PRN

## 2015-09-02 MED ORDER — SODIUM CHLORIDE 0.9 % IV SOLN
INTRAVENOUS | Status: DC | PRN
  Administered 2015-09-02: 10:00:00

## 2015-09-02 MED ORDER — DEXMEDETOMIDINE HCL IN NACL 200 MCG/50ML IV SOLN
INTRAVENOUS | Status: AC
  Filled 2015-09-02: qty 100

## 2015-09-02 MED ORDER — TRAMADOL HCL 50 MG PO TABS: 50.0000 mg | ORAL_TABLET | Freq: Three times a day (TID) | ORAL | Status: DC | PRN

## 2015-09-02 SURGICAL SUPPLY — 37 items
ARMBAND PINK RESTRICT EXTREMIT (MISCELLANEOUS) ×3 IMPLANT
BNDG GAUZE ELAST 4 BULKY (GAUZE/BANDAGES/DRESSINGS) ×2 IMPLANT
CANISTER SUCTION 2500CC (MISCELLANEOUS) ×3 IMPLANT
CATH EMB LATEX FREE 3FRX80CM (CATHETERS) ×3
CATH EMB LF 3FRX80 (CATHETERS) IMPLANT
CLIP TI MEDIUM 6 (CLIP) ×3 IMPLANT
CLIP TI WIDE RED SMALL 6 (CLIP) ×5 IMPLANT
COVER PROBE W GEL 5X96 (DRAPES) ×2 IMPLANT
DRAIN PENROSE 1/4X12 LTX STRL (WOUND CARE) ×1 IMPLANT
ELECT REM PT RETURN 9FT ADLT (ELECTROSURGICAL) ×3
ELECTRODE REM PT RTRN 9FT ADLT (ELECTROSURGICAL) ×1 IMPLANT
GEL ULTRASOUND 20GR AQUASONIC (MISCELLANEOUS) IMPLANT
GLOVE BIOGEL PI IND STRL 6.5 (GLOVE) IMPLANT
GLOVE BIOGEL PI IND STRL 7.0 (GLOVE) IMPLANT
GLOVE BIOGEL PI INDICATOR 6.5 (GLOVE) ×6
GLOVE BIOGEL PI INDICATOR 7.0 (GLOVE) ×4
GLOVE SS BIOGEL STRL SZ 7 (GLOVE) ×1 IMPLANT
GLOVE SUPERSENSE BIOGEL SZ 7 (GLOVE)
GLOVE SURG SS PI 7.0 STRL IVOR (GLOVE) ×2 IMPLANT
GOWN STRL REUS W/ TWL LRG LVL3 (GOWN DISPOSABLE) ×3 IMPLANT
GOWN STRL REUS W/TWL LRG LVL3 (GOWN DISPOSABLE) ×9
GOWN STRL REUS W/TWL XL LVL3 (GOWN DISPOSABLE) ×2 IMPLANT
KIT BASIN OR (CUSTOM PROCEDURE TRAY) ×3 IMPLANT
KIT ROOM TURNOVER OR (KITS) ×3 IMPLANT
LIQUID BAND (GAUZE/BANDAGES/DRESSINGS) ×3 IMPLANT
NS IRRIG 1000ML POUR BTL (IV SOLUTION) ×3 IMPLANT
PACK CV ACCESS (CUSTOM PROCEDURE TRAY) ×3 IMPLANT
PAD ARMBOARD 7.5X6 YLW CONV (MISCELLANEOUS) ×6 IMPLANT
SLEEVE SURGEON STRL (DRAPES) ×2 IMPLANT
SUT PROLENE 6 0 BV (SUTURE) ×3 IMPLANT
SUT SILK 4 0 (SUTURE) ×3
SUT SILK 4-0 18XBRD TIE 12 (SUTURE) IMPLANT
SUT VIC AB 3-0 SH 27 (SUTURE) ×3
SUT VIC AB 3-0 SH 27X BRD (SUTURE) ×1 IMPLANT
TAPE PAPER 2X10 WHT MICROPORE (GAUZE/BANDAGES/DRESSINGS) ×2 IMPLANT
UNDERPAD 30X30 INCONTINENT (UNDERPADS AND DIAPERS) ×3 IMPLANT
WATER STERILE IRR 1000ML POUR (IV SOLUTION) ×3 IMPLANT

## 2015-09-02 NOTE — Anesthesia Preprocedure Evaluation (Addendum)
Anesthesia Evaluation  Patient identified by MRN, date of birth, ID band Patient awake    Reviewed: Allergy & Precautions, NPO status , Patient's Chart, lab work & pertinent test results  Airway Mallampati: II  TM Distance: >3 FB Neck ROM: Full    Dental  (+) Teeth Intact, Dental Advisory Given   Pulmonary shortness of breath and with exertion, former smoker,    breath sounds clear to auscultation       Cardiovascular hypertension, + CAD and + Past MI   Rhythm:Regular Rate:Normal     Neuro/Psych    GI/Hepatic GERD  Medicated and Controlled,  Endo/Other    Renal/GU CRFRenal disease     Musculoskeletal   Abdominal   Peds  Hematology  (+) anemia ,   Anesthesia Other Findings   Reproductive/Obstetrics                            Anesthesia Physical Anesthesia Plan  ASA: III  Anesthesia Plan: MAC   Post-op Pain Management:    Induction: Intravenous  Airway Management Planned: Natural Airway and Simple Face Mask  Additional Equipment:   Intra-op Plan:   Post-operative Plan:   Informed Consent: I have reviewed the patients History and Physical, chart, labs and discussed the procedure including the risks, benefits and alternatives for the proposed anesthesia with the patient or authorized representative who has indicated his/her understanding and acceptance.   Dental advisory given and Dental Advisory Given  Plan Discussed with: CRNA and Anesthesiologist  Anesthesia Plan Comments: (ESRD last HD 4/11 K-3.1 Type 2 DM glucose 84 Hypertension H/O A fib now in SR PVD CAD S/P CABG 2007 EF 45-50% by echo GERD Anemia H/H -6.1/18  PLAN MAC)       Anesthesia Quick Evaluation

## 2015-09-02 NOTE — Op Note (Signed)
OPERATIVE REPORT  Date of Surgery: 09/02/2015  Surgeon: Josephina Gip, MD  Assistant: Doreatha Massed PA  Pre-op Diagnosis: End Stage Renal Disease N18.6  Post-op Diagnosis: End Stage Renal Disease N18.6  Procedure: Procedure(s): RADIAL - CEPHALIC ARTERIOVENOUS (AV) FISTULA CREATION LEFT ARM  Anesthesia: Mac  EBL: Minimal  Complications: None  Procedure Details: The patient was taken the operating room placed in supine position at which time left upper extremity was prepped Betadine scrub and solution draped in routine sterile manner. Using the B-mode ultrasound sono site the cephalic vein was imaged from the shoulder to the wrist. It was felt to be borderline but probably adequate in the forearm for fistula creation.  Therefore after infiltration forms and Xylocaine with epinephrine short longitudinal incision was made between the radial artery and cephalic vein. Branches were ligated with 3-0 silk ties and divided and the cephalic vein was ligated distally and transected.  Radial artery was then exposed. It was a diffusely diseased and partially calcified vessel but did have an excellent pulse and was 2-1/2 mm in size.  After it was dissected free proximally and distally it was then occluded with a vascular clamp proximally vessel loop distally opened 15 blade extended with Potts scissors. It would accept a 2-1/2 mm dilator proximally and distally but as noted was diffusely diseased but had good inflow. Vein was carefully measured spatulated and anastomosed end to side with 6-0 Prolene. Vesseloops and clamps were then released and there was good pulse and palpable thrill in the fistula and excellent Doppler flow up to the antecubital area. There was improvement and radial arterial flow distally with temporary compression of the fistula. Adequate hemostasis was achieved. No heparin or protamine was given. Wounds closed in layers with 5: Subcuticular fashion with Dermabond patient taken to  recovery room in satisfactory condition   Josephina Gip, MD 09/02/2015 11:40 AM

## 2015-09-02 NOTE — Anesthesia Procedure Notes (Signed)
Procedure Name: MAC Date/Time: 09/02/2015 10:20 AM Performed by: Carmela Rima Pre-anesthesia Checklist: Patient being monitored, Suction available, Emergency Drugs available and Patient identified Oxygen Delivery Method: Nasal cannula Placement Confirmation: positive ETCO2

## 2015-09-02 NOTE — H&P (View-Only) (Signed)
Patient name: [name] MRN: [id number] DOB: July 15, 1935 Sex: female   Referred by: Dr. Eliott Nine  Reason for referral:  Chief Complaint  Patient presents with  . New Evaluation    HISTORY OF PRESENT ILLNESS: The patient was recently in the hospital with multiple medical problems including sepsis, worsening kidney failure acute systolic heart failure new onset atrial fibrillation with rapid ventricular response.  She is now on dialysis through a catheter.  She did not have permanent access discussions while she was in the hospital, she was refusing to have them done and was not felt that she would survive.  She is accident well since the hospital and is tolerating dialysis.  The patient is right handed.  She is on Coumadin and Plavix.  She takes the Plavix for history of renal artery stenosis and the Coumadin for atrial fibrillation she has a history of coronary artery disease, status post CABG.  She has also undergone a right femoral-popliteal bypass in 1997 by Dr. Hart Rochester.  Past Medical History  Diagnosis Date  . CAD in native artery 2007    a. S/p CABG 2007 (LIMA-LAD, SVG-dRCA, SVG-intermediate, SVG-diagonal). b. Nuclear stress test 08/2012: normal, low risk.  . S/P CABG x 4 2007    LIMA-LAD, SVG-Distal RCA, SVG-Ramus, SVG-Diagonal; no echocardiogram done  . PAD (peripheral artery disease) (HCC) 1997    Mild-moderate carotid disease; status post right SFA occlusion with Fem-Below Knee Pop bypass (Dr. Hart Rochester); LEA Dopplers 07/2014: RIGHT - ABI 0.84, CIA/EIA 50-69%, CFA/PFA patent Fem-Pop bypass patent w/ 70-99% anastomotic stenosis, patent Pop A with 3 V runoff; LABI 0.85- L CIA/EIA patent, LPFA 70-99%, dLSFA 50-69%, patent LPopA & 3 V runoff.  . Renovascular hypertension 12/17/2013    a. s/p L RA Stent 11/2013. b. Renal duplex 02/2015: >60% right proximal renal artery stenosis, normal left renal artery s/p stent, f/u 1 yr recommended.  . CKD (chronic kidney disease), stage III     . Essential hypertension   . Hyperlipidemia LDL goal <70   . Carotid artery disease (HCC)     a. Mild-mod carotid disease per Dr.Berry's note.  . Anemia     Past Surgical History  Procedure Laterality Date  . Cardiac catheterization  December 2007    LAD-90% mid, D19 percent ostial. Circumflex-OM1 90% ostial, 80% mid. RCA 9% proximal, 100% mid  . Femoropopliteal bypass Right 1997    Dr. Hart Rochester: SFA-below knee Pop  . Coronary artery bypass graft  December 2007    LIMA-LAD, SVG-distal RCA, SVG-D1, SVG-OM1/Ramus  . Nm myoview ltd  April 2014    EF 63%, mild fixed anteroapical defect thought to be breast attenuation  . Appendectomy    . Tonsillectomy    . Cataract surgery    . Rectovaginal fistula repair  1997  . [place] bhl  1981  . Renal artery stent Left 12/16/13    using CO2  . Lower extremity venous doppler  06/13/2011    No evidence of thrombus or thrombophlebitis, no venous insuffiency noted.  . Cardiovascular stress test  08/28/2012    Normal stress nuclear study with likely breast attenuation, low risk stress test.  . Renal angiogram N/A 12/16/2013    Procedure: RENAL ANGIOGRAM;  Surgeon: Runell Gess, MD;  Location: [facility] CATH LAB;  Service: Cardiovascular;  Laterality: N/A;  . Percutaneous stent intervention Left 12/16/2013    Procedure: PERCUTANEOUS STENT INTERVENTION - LEFT RENAL ARTERY;  Surgeon: Runell Gess, MD;  Location: [facility] CATH LAB;  Service: Cardiovascular;  Laterality: Left;  renal  . Cardiac catheterization N/A 07/09/2015    Procedure: Right Heart Cath;  Surgeon: Dolores Patty, MD;  Location: [facility] INVASIVE CV LAB;  Service: Cardiovascular;  Laterality: N/A;  . Cardiac catheterization N/A 07/13/2015    Procedure: Left Heart Cath and Cors/Grafts Angiography;  Surgeon: Marykay Lex, MD;  Location: [facility] INVASIVE CV LAB;  Service: Cardiovascular;  Laterality: N/A;  . Peripheral vascular catheterization N/A 07/13/2015    Procedure: Renal Angiography;  Surgeon: Marykay Lex, MD;  Location: [facility] INVASIVE CV LAB;  Service: Cardiovascular;  Laterality: N/A;    Social History   Social History  . Marital Status: Married    Spouse Name: N/A  . Number of Children: N/A  . Years of Education: N/A   Occupational History  . Not on file.   Social History Main Topics  . Smoking status: Former Smoker    Types: Cigarettes    Quit date: 05/31/1993  . Smokeless tobacco: Never Used  . Alcohol Use: 4.2 oz/week    7 Glasses of wine per week     Comment: 4 shots of whiskey per night   . Drug Use: No  . Sexual Activity: Not on file   Other Topics Concern  . Not on file   Social History Narrative   She is a married mother of 4 with one child that died at a young age. She has 11 grandchildren and 11 great grandchildren. She is not excessively active. She does get around the house. No routine exercise   She quit smoking in 1993, and has a social alcohol beverage on occasion.   She is a former Hotel manager working as a Lawyer.    Family History  Problem Relation Age of Onset  . Breast cancer Mother   . Heart attack Father   . Cancer - Cervical Brother     Allergies as of 08/14/2015 - Review Complete 08/14/2015  Allergen Reaction Noted  . Angiotensin receptor blockers  12/15/2013  . Latex Swelling 08/28/2012  . Statins Other (See Comments) 06/02/2013  . Benicar [olmesartan] Itching and Rash 05/31/2013    Current Outpatient Prescriptions on File Prior to Visit  Medication Sig Dispense Refill  . acetaminophen (TYLENOL) 325 MG tablet Take 650 mg by mouth every 6 (six) hours as needed for moderate pain.    Marland Kitchen amiodarone (PACERONE) 200 MG tablet Take 1 tablet (200 mg total) by mouth daily. 400 mg daily for 1 week then decrease to 200 mg daily 100 tablet 3  . betamethasone dipropionate (DIPROLENE) 0.05 % cream Apply 1 application topically 2 (two) times daily as needed. ECZEMA    . clopidogrel (PLAVIX) 75 MG tablet TAKE 1 TABLET (75  MG TOTAL) BY MOUTH DAILY WITH BREAKFAST. 30 tablet 11  . colchicine 0.6 MG tablet Take 0.6 mg by mouth 2 (two) times daily as needed. GOUT FLARE UP.    . febuxostat (ULORIC) 40 MG tablet Take 40 mg by mouth daily.    . hydrALAZINE (APRESOLINE) 10 MG tablet Take 1 tablet (10 mg total) by mouth every 8 (eight) hours. 90 tablet 0  . LORazepam (ATIVAN) 0.5 MG tablet Take 0.5 mg by mouth 4 (four) times daily as needed for anxiety.    . metoprolol (LOPRESSOR) 50 MG tablet Take 1.5 tablets (75 mg total) by mouth 2 (two) times daily. Hold if HR below 50 or if SBP below 110    . Multiple Vitamins-Minerals (ICAPS)  CAPS Take 1 capsule by mouth 2 (two) times daily.    . multivitamin (RENA-VIT) TABS tablet Take 1 tablet by mouth daily.    . Nutritional Supplements (FEEDING SUPPLEMENT, NEPRO CARB STEADY,) LIQD Take 237 mLs by mouth 2 (two) times daily between meals. 60 Can 0  . omeprazole (PRILOSEC) 20 MG capsule Take 20 mg by mouth daily.    Marland Kitchen warfarin (COUMADIN) 5 MG tablet Take 1 tablet (5 mg total) by mouth daily at 6 PM. 30 tablet 0   No current facility-administered medications on file prior to visit.     REVIEW OF SYSTEMS: Cardiovascular: No chest pain, chest pressure, Positive for irregular heartbeat Pulmonary: No productive cough, asthma or wheezing. Neurologic: No weakness, paresthesias, aphasia, or amaurosis. No dizziness. Hematologic: No bleeding problems or clotting disorders. Musculoskeletal: No joint pain or joint swelling. Gastrointestinal: No blood in stool or hematemesis Genitourinary: No dysuria or hematuria. Psychiatric:: No history of major depression. Integumentary: No rashes or ulcers. Constitutional: No fever or chills.  PHYSICAL EXAMINATION:  Filed Vitals:   08/14/15 1623  BP: 138/53  Pulse: 57  Temp: 98.5 F (36.9 C)  TempSrc: Oral  Resp: 14  Height: 5\' 3"  (1.6 m)  Weight: 156 lb 4.8 oz (70.897 kg)  SpO2: 98%   Body mass index is 27.69 kg/(m^2). General: The  patient appears their stated age.   HEENT:  No gross abnormalities Pulmonary: Respirations are non-labored Musculoskeletal: There are no major deformities.   Neurologic: No focal weakness or paresthesias are detected, Skin: There are no ulcer or rashes noted. Psychiatric: The patient has normal affect. Cardiovascular: There is a regular rate and rhythm without significant murmur appreciated.Palpable radial and brachial pulses bilaterally  Diagnostic Studies: I have reviewed her vascular lab studies.  She has excellent left cephalic vein beginning in the distal forearm.  Arterial waveforms are triphasic   Assessment:  End-stage renal disease Plan: I discussed with the patient and her daughter proceeding with a left radiocephalic fistula.  The risks and benefits of the operation were discussed with the patient including the risk of non-maturity, the need for future interventions, and the risk of steal syndrome.  Before proceeding, I will have her evaluated by her cardiologist, Dr. Herbie Baltimore in one week.  She will need to be off of Coumadin and Plavix which I will leave to his discretion.     Jorge Ny, M.D. [facility] Office: [phone] Pager:  [phone]

## 2015-09-02 NOTE — Anesthesia Postprocedure Evaluation (Signed)
Anesthesia Post Note  Patient: [name]  Procedure(s) Performed: Procedure(s) (LRB): RADIAL - CEPHALIC ARTERIOVENOUS (AV) FISTULA CREATION LEFT ARM (Left)  Patient location during evaluation: PACU Anesthesia Type: MAC Level of consciousness: awake, awake and alert and oriented Pain management: pain level controlled Vital Signs Assessment: post-procedure vital signs reviewed and stable Respiratory status: spontaneous breathing, nonlabored ventilation and respiratory function stable Cardiovascular status: blood pressure returned to baseline Anesthetic complications: no    Last Vitals:  Filed Vitals:   09/02/15 1225 09/02/15 1233  BP:  105/45  Pulse:  52  Temp: 36.6 C 36.6 C  Resp:  14    Last Pain:  Filed Vitals:   09/02/15 1234  PainSc: 0-No pain                 Miyuki Rzasa COKER

## 2015-09-02 NOTE — Progress Notes (Signed)
Patient's HgB 6.1.  Dr. Noreene Larsson made aware.  Will continue to monitor.

## 2015-09-02 NOTE — Transfer of Care (Signed)
Immediate Anesthesia Transfer of Care Note  Patient: [name]  Procedure(s) Performed: Procedure(s): RADIAL - CEPHALIC ARTERIOVENOUS (AV) FISTULA CREATION LEFT ARM (Left)  Patient Location: PACU  Anesthesia Type:MAC  Level of Consciousness: awake, alert  and oriented  Airway & Oxygen Therapy: Patient Spontanous Breathing  Post-op Assessment: Report given to RN, Post -op Vital signs reviewed and stable and Patient moving all extremities X 4  Post vital signs: Reviewed and stable  Last Vitals:  Filed Vitals:   09/02/15 0739  BP: 153/43  Pulse: 58  Temp: 37.3 C  Resp: 14    Complications: No apparent anesthesia complications

## 2015-09-02 NOTE — Discharge Instructions (Signed)
° ° °  09/02/2015 [name] [id number] 15-Dec-1935  Surgeon(s): Pryor Ochoa, MD  Procedure(s): RADIAL - CEPHALIC ARTERIOVENOUS (AV) FISTULA CREATION LEFT ARM  x Do not stick fistula for 12 weeks

## 2015-09-02 NOTE — Anesthesia Postprocedure Evaluation (Signed)
Anesthesia Post Note  Patient: [name] Sens  Procedure(s) Performed: Procedure(s) (LRB): RADIAL - CEPHALIC ARTERIOVENOUS (AV) FISTULA CREATION LEFT ARM (Left)  Patient location during evaluation: Endoscopy Anesthesia Type: MAC Level of consciousness: awake, awake and alert and oriented Pain management: pain level controlled Vital Signs Assessment: post-procedure vital signs reviewed and stable Respiratory status: spontaneous breathing, nonlabored ventilation and respiratory function stable Cardiovascular status: blood pressure returned to baseline Anesthetic complications: no    Last Vitals:  Filed Vitals:   09/02/15 1225 09/02/15 1233  BP:  105/45  Pulse:  52  Temp: 36.6 C 36.6 C  Resp:  14    Last Pain:  Filed Vitals:   09/02/15 1234  PainSc: 0-No pain                 Ari Bernabei COKER

## 2015-09-02 NOTE — Interval H&P Note (Signed)
History and Physical Interval Note:  09/02/2015 9:50 AM  [name]  has presented today for surgery, with the diagnosis of End Stage Renal Disease N18.6  The various methods of treatment have been discussed with the patient and family. After consideration of risks, benefits and other options for treatment, the patient has consented to  Procedure(s): ARTERIOVENOUS (AV) FISTULA CREATION (Left) as a surgical intervention .  The patient's history has been reviewed, patient examined, no change in status, stable for surgery.  I have reviewed the patient's chart and labs.  Questions were answered to the patient's satisfaction.     Josephina Gip

## 2015-09-03 ENCOUNTER — Encounter (HOSPITAL_COMMUNITY): Payer: Self-pay | Admitting: Vascular Surgery

## 2015-09-03 ENCOUNTER — Telehealth: Payer: Self-pay | Admitting: Vascular Surgery

## 2015-09-03 NOTE — Telephone Encounter (Signed)
-----   Message from Sharee Pimple, RN sent at 09/02/2015 11:55 AM EDT ----- Regarding: schedule   ----- Message -----    From: Dara Lords, PA-C    Sent: 09/02/2015  11:31 AM      To: [facility] Charge Pool  S/p left RC AVF 09/02/15.  F/u with JDL in 6 weeks with duplex.  Thanks, Lelon Mast

## 2015-09-03 NOTE — Telephone Encounter (Signed)
sched appt lab 5/26 at 4 and MD on 5/30 at 8. Spoke to pt to inform them of appt.

## 2015-09-05 LAB — PROTIME-INR
INR: 1.6 — AB (ref 0.9–1.1)
INR: 1.6 — AB (ref <=1.1)

## 2015-09-09 ENCOUNTER — Ambulatory Visit (INDEPENDENT_AMBULATORY_CARE_PROVIDER_SITE_OTHER): Payer: Medicare Other | Admitting: Pharmacist Clinician (PhC)/ Clinical Pharmacy Specialist

## 2015-09-09 DIAGNOSIS — I481 Persistent atrial fibrillation: Secondary | ICD-10-CM

## 2015-09-09 DIAGNOSIS — I4819 Other persistent atrial fibrillation: Secondary | ICD-10-CM

## 2015-09-09 DIAGNOSIS — Z5181 Encounter for therapeutic drug level monitoring: Secondary | ICD-10-CM

## 2015-09-10 ENCOUNTER — Encounter: Payer: Self-pay | Admitting: Cardiology

## 2015-09-10 ENCOUNTER — Ambulatory Visit (INDEPENDENT_AMBULATORY_CARE_PROVIDER_SITE_OTHER): Payer: Medicare Other | Admitting: Cardiology

## 2015-09-10 VITALS — BP 156/78 | HR 61 | Ht 63.0 in | Wt 158.0 lb

## 2015-09-10 DIAGNOSIS — I2510 Atherosclerotic heart disease of native coronary artery without angina pectoris: Secondary | ICD-10-CM

## 2015-09-10 DIAGNOSIS — D689 Coagulation defect, unspecified: Secondary | ICD-10-CM | POA: Diagnosis not present

## 2015-09-10 DIAGNOSIS — E785 Hyperlipidemia, unspecified: Secondary | ICD-10-CM

## 2015-09-10 DIAGNOSIS — I4819 Other persistent atrial fibrillation: Secondary | ICD-10-CM

## 2015-09-10 DIAGNOSIS — R5383 Other fatigue: Secondary | ICD-10-CM

## 2015-09-10 DIAGNOSIS — I428 Other cardiomyopathies: Secondary | ICD-10-CM

## 2015-09-10 DIAGNOSIS — I481 Persistent atrial fibrillation: Secondary | ICD-10-CM

## 2015-09-10 DIAGNOSIS — I701 Atherosclerosis of renal artery: Secondary | ICD-10-CM

## 2015-09-10 DIAGNOSIS — I429 Cardiomyopathy, unspecified: Secondary | ICD-10-CM | POA: Diagnosis not present

## 2015-09-10 DIAGNOSIS — Z79899 Other long term (current) drug therapy: Secondary | ICD-10-CM

## 2015-09-10 DIAGNOSIS — R7401 Elevation of levels of liver transaminase levels: Secondary | ICD-10-CM

## 2015-09-10 DIAGNOSIS — R740 Nonspecific elevation of levels of transaminase and lactic acid dehydrogenase [LDH]: Secondary | ICD-10-CM

## 2015-09-10 NOTE — Patient Instructions (Signed)
Your physician recommends that you schedule a follow-up appointment in: 2-3 Months  Your physician recommends that you return for lab work   Your physician has recommended that you have a pulmonary function test. Pulmonary Function Tests are a group of tests that measure how well air moves in and out of your lungs.

## 2015-09-10 NOTE — Progress Notes (Signed)
PCP: Michiel Sites, MD  Clinic Note: Chief Complaint  Patient presents with  . Follow-up    follow up after echo  . Cardiomyopathy    Nonischemic/tachycardia mediated  . Atrial Fibrillation  . Coronary Artery Disease    HPI: [name] is a 80 y.o. female with a PMH below who presents today for Second hospital follow-up -- she has a history of coronary disease status post CABG, PAD status post right femoropopliteal bypass, hypertension, hyperlipidemia, now end-stage renal disease on dialysis with history of renal artery stenting in 2015. I last saw her in clinic during the love 2016, but I did see her in the hospital and rounded on her for the most time she was there. She was initially a patient of Dr. Aleen Campi, who temporarily saw Dr. Allyson Sabal for her PAD and renal artery disease. I first started seeing her in December 2013.  Recent Hospitalizations: Reviewed --> admitted from the 10th February 26. She basically started off with pneumonia and developed sepsis. She was also noted to be extremely volume overloaded with cardiomyopathy noted on echocardiogram. This was thought to be related to recurrent atrial fibrillation that was very difficult control. Her EF by echocardiogram while in the hospital showed a reduction to 20-25%. With diuresis, her renal function progressively got worse to the point where the decision was made to start her on dialysis. She underwent right heart catheterization which revealed that she still needed volume removal, but was overall improved. She then underwent left heart catheterization which revealed patent grafts and no significant lesions suggesting that her cardiopathy was nonischemic and therefore likely related to age of ablation.  No culprit lesion to explain Cardiomyopathy. lLVEDP 26 mmHg   She had been started on amiodarone and had chemical cardioversion into sinus rhythm on anticoagulation. She got better relatively quickly after converting to  sinus rhythm.   She is now on dialysis 3 times a week (Tuesday Thursday Saturday), currently using a dialysis catheter but has a fistula in place.  [name] was last seen in the hospital.  Saw [name], [place] for [name] /fu.  She actually is feeling much better since being on dialysis.  As per the original plan, a follow-up echocardiogram was ordered (see below). She now presents for follow-up and to discuss results.  Studies Reviewed:   Echo 08/26/15: EF 45-50% mild concentric Vertebrae. Anteroseptal hypokinesis. Pseudo-normal/grade 2 diastolic dysfunction. Moderate AI. Mild MR. Severe LA dilation. Mild RV dilation. Mild TR. Significant improvement in LVEF.  Interval History: [name] presents today looking great. She really doesn't have any major complaints. No edema. No PND and orthopnea since she has been on dialysis. She does have it dialysis tires her out a little bit, but she is feeling much better. Her blood pressures also been much better controlled and is only on hydralazine low-dose along with Lopressor. Her memory is still a little bit on the slow side as far as recovering since her hospital stay, she is not having any delirium. He has not noted any rapid or irregular heartbeat/palpitations since discharge.  She actually notices less exertional dyspnea and has had no resting or exertional chest pain. No  weakness or syncope/near syncope. No TIA/amaurosis fugax symptoms. No melena, hematochezia, hematuria, or epstaxis on combination of aspirin and Plavix No claudication.  ROS: A comprehensive was performed. Review of Systems  Constitutional: Negative for weight loss (* Gave back her strength and eating better. Now back to more baseline weight for her) and malaise/fatigue.  HENT: Negative for congestion and nosebleeds.   Respiratory: Positive for wheezing (Some baseline wheezing but nothing significant). Negative for cough and shortness of breath.   Cardiovascular:  Negative for claudication (Actually is relatively stable).  Gastrointestinal: Negative for heartburn and abdominal pain.  Genitourinary: Negative for dysuria.       She still does make urine, but not as much.  Musculoskeletal: Positive for joint pain (Mild arthritis pains only). Negative for myalgias.  Neurological: Positive for dizziness (Some positional dizziness, but not significant). Negative for weakness (Getting stronger every day) and headaches.  Psychiatric/Behavioral: Positive for memory loss (Is starting had issues with memory. This started before her hospital stay, just accelerated with her hospital stay).  All other systems reviewed and are negative.   Past Medical History  Diagnosis Date  . CAD in native artery 2007    a. S/p CABG 2007 (LIMA-LAD, SVG-dRCA, SVG-intermediate, SVG-diagonal). b. Nuclear stress test 08/2012: normal, low risk.  . S/P CABG x 4 2007    LIMA-LAD, SVG-Distal RCA, SVG-Ramus, SVG-Diagonal; no echocardiogram done  . PAD (peripheral artery disease) (HCC) 1997    Mild-moderate carotid disease; status post right SFA occlusion with Fem-Below Knee Pop bypass (Dr. Hart Rochester); LEA Dopplers 07/2014: RIGHT - ABI 0.84, CIA/EIA 50-69%, CFA/PFA patent Fem-Pop bypass patent w/ 70-99% anastomotic stenosis, patent Pop A with 3 V runoff; LABI 0.85- L CIA/EIA patent, LPFA 70-99%, dLSFA 50-69%, patent LPopA & 3 V runoff.  . Renovascular hypertension 12/17/2013    a. s/p L RA Stent 11/2013. b. Renal duplex 02/2015: >60% right proximal renal artery stenosis, normal left renal artery s/p stent, f/u 1 yr recommended.  . CKD (chronic kidney disease), stage III   . Essential hypertension   . Hyperlipidemia LDL goal <70   . Carotid artery disease (HCC)     a. Mild-mod carotid disease per Dr.Berry's note.  . Anemia   . Pneumonia   . CHF (congestive heart failure) (HCC)   . A-fib (HCC)     On amiodarone, warfarin  . Myocardial infarction (HCC)   . GERD (gastroesophageal reflux disease)    . End-stage renal disease on hemodialysis (HCC) 07/19/2015    Past Surgical History  Procedure Laterality Date  . Cardiac catheterization  December 2007    LAD-90% mid, D19 percent ostial. Circumflex-OM1 90% ostial, 80% mid. RCA 9% proximal, 100% mid  . Femoropopliteal bypass Right 1997    Dr. Hart Rochester: SFA-below knee Pop  . Coronary artery bypass graft  December 2007    LIMA-LAD, SVG-distal RCA, SVG-D1, SVG-OM1/Ramus  . Nm myoview ltd  April 2014    EF 63%, mild fixed anteroapical defect thought to be breast attenuation  . Appendectomy    . Tonsillectomy    . Cataract surgery    . Rectovaginal fistula repair  1997  . [place] bhl  1981  . Renal artery stent Left 12/16/13    using CO2  . Lower extremity venous doppler  06/13/2011    No evidence of thrombus or thrombophlebitis, no venous insuffiency noted.  . Cardiovascular stress test  08/28/2012    Normal stress nuclear study with likely breast attenuation, low risk stress test.  . Renal angiogram N/A 12/16/2013    Procedure: RENAL ANGIOGRAM;  Surgeon: Runell Gess, MD;  Location[facility] CATH LAB;  Service: Cardiovascular;  Laterality: N/A;  . Percutaneous stent intervention Left 12/16/2013    Procedure: PERCUTANEOUS STENT INTERVENTION - LEFT RENAL ARTERY;  Surgeon: Runell Gess, MD;  Location: [facility] CATH LAB;  Service: Cardiovascular;  Laterality: Left;  renal  . Cardiac catheterization N/A 07/09/2015    Procedure: Right Heart Cath;  Surgeon: Dolores Patty, MD;  Location: [facility] INVASIVE CV LAB;  Service: Cardiovascular;  Laterality: N/A;  . Cardiac catheterization N/A 07/13/2015    Procedure: Left Heart Cath and Cors/Grafts Angiography;  Surgeon: Marykay Lex, MD;  Location: [facility] INVASIVE CV LAB;  Service: Cardiovascular;  Laterality: N/A;  . Peripheral vascular catheterization N/A 07/13/2015    Procedure: Renal Angiography;  Surgeon: Marykay Lex, MD;  Location: [facility] INVASIVE CV LAB;  Service: Cardiovascular;  Laterality: N/A;  . Back  surgery    . Colonoscopy    . Abdominal hysterectomy    . Av fistula placement Left 09/02/2015    Procedure: RADIAL - CEPHALIC ARTERIOVENOUS (AV) FISTULA CREATION LEFT ARM;  Surgeon: Pryor Ochoa, MD;  Location: [facility] OR;  Service: Vascular;  Laterality: Left;   Prior to Admission medications   Medication Sig Start Date End Date Taking? Authorizing Provider  acetaminophen (TYLENOL) 325 MG tablet Take 650 mg by mouth every 6 (six) hours as needed for moderate pain.   Yes Historical Provider, MD  amiodarone (PACERONE) 200 MG tablet Take 1 tablet (200 mg total) by mouth daily. 400 mg daily for 1 week then decrease to 200 mg daily Patient taking differently: Take 200 mg by mouth daily.  08/14/15  Yes Beatrice Lecher, PA-C  aspirin 81 MG tablet Take 81 mg by mouth daily.   Yes Historical Provider, MD  betamethasone dipropionate (DIPROLENE) 0.05 % cream Apply 1 application topically 2 (two) times daily as needed. ECZEMA   Yes Historical Provider, MD  clopidogrel (PLAVIX) 75 MG tablet TAKE 1 TABLET (75 MG TOTAL) BY MOUTH DAILY WITH BREAKFAST. 11/10/14  Yes Marykay Lex, MD  colchicine 0.6 MG tablet Take 0.6 mg by mouth 2 (two) times daily as needed. GOUT FLARE UP.   Yes Historical Provider, MD  febuxostat (ULORIC) 40 MG tablet Take 40 mg by mouth daily.   Yes Historical Provider, MD  hydrALAZINE (APRESOLINE) 10 MG tablet Take 1 tablet (10 mg total) by mouth every 8 (eight) hours. 08/31/15  Yes Marykay Lex, MD  LORazepam (ATIVAN) 0.5 MG tablet Take 0.5 mg by mouth 4 (four) times daily as needed for anxiety.   Yes Historical Provider, MD  metoprolol (LOPRESSOR) 50 MG tablet Take 1.5 tablets (75 mg total) by mouth 2 (two) times daily. Hold if HR below 50 or if SBP below 110 08/14/15  Yes Scott T Weaver, PA-C  Multiple Vitamins-Minerals (ICAPS) CAPS Take 1 capsule by mouth 2 (two) times daily.   Yes Historical Provider, MD  multivitamin (RENA-VIT) TABS tablet Take 1 tablet by mouth daily.   Yes Historical  Provider, MD  Nutritional Supplements (FEEDING SUPPLEMENT, NEPRO CARB STEADY,) LIQD Take 237 mLs by mouth 2 (two) times daily between meals. 07/19/15  Yes Nishant Dhungel, MD  omeprazole (PRILOSEC) 20 MG capsule Take 20 mg by mouth daily.   Yes Historical Provider, MD  warfarin (COUMADIN) 2.5 MG tablet Take 1 tablet (2.5 mg total) by mouth daily. Take 1 tablet by mouth daily or as directed by coumadin clinic 09/02/15  Yes Samantha J Rhyne, PA-C   Allergies  Allergen Reactions  . Latex Swelling  . Statins Other (See Comments)    CLASS REACTION:  DIFFUSE CRAMPING Includes Lipitor, Crestor, Pravachol and simvastatin  . Angiotensin Receptor Blockers Itching and Rash  . Benicar [Olmesartan] Itching and Rash  Social History   Social History  . Marital Status: Married    Spouse Name: N/A  . Number of Children: N/A  . Years of Education: N/A   Social History Main Topics  . Smoking status: Former Smoker    Types: Cigarettes    Quit date: 05/31/1993  . Smokeless tobacco: Never Used  . Alcohol Use: 4.2 oz/week    7 Glasses of wine per week     Comment: 4 shots of whiskey per night; denies,  last use 2/10/ 17  . Drug Use: No  . Sexual Activity: Not Asked   Other Topics Concern  . None   Social History Narrative   She is a married mother of 4 with one child that died at a young age. She has 11 grandchildren and 11 great grandchildren. She is not excessively active. She does get around the house. No routine exercise   She quit smoking in 1993, and has a social alcohol beverage on occasion.   She is a former Hotel manager working as a Lawyer.   Family History  Problem Relation Age of Onset  . Breast cancer Mother   . Heart attack Father   . Cancer - Cervical Brother      Wt Readings from Last 3 Encounters:  09/10/15 158 lb (71.668 kg)  09/02/15 148 lb (67.132 kg)  08/14/15 156 lb 4.8 oz (70.897 kg)    PHYSICAL EXAM BP 156/78 mmHg  Pulse 61  Ht 5'  3" (1.6 m)  Wt 158 lb (71.668 kg)  BMI 28.00 kg/m2 General appearance: alert, cooperative, appears stated age, no distress and Well-nourished, well-groomed. Healthy-appearing. Neck: no adenopathy, no carotid bruit and no JVD Lungs: clear to auscultation bilaterally, normal percussion bilaterally and non-labored Heart: RRR, normal S1 and S2 with 1-2/6 HSM at apex and left lower sternal border. Unable to really hear a diastolic murmur. Nondisplaced PMI. Abdomen: soft, non-tender; bowel sounds normal; no masses,  no organomegaly;  Extremities: extremities normal, atraumatic, no cyanosis, and no edema; left IJ dialysis catheter Pulses: Mildly diminished bilateral lower extremity pulses, palpable Skin: mobility and turgor normal and Several areas of healing bruising. She has the fistula in the left arm recently started. Lots of bruising from that. Neurologic: Mental status: Alert, oriented, thought content appropriate; but does have a little difficulty with answering questions and remembering things. Cranial nerves: normal (II-XII grossly intact)    Adult ECG Report  Rate: 61 ;  Rhythm: normal sinus rhythm and 1 AV block (PR 260). Borderline left axis deviation (-24).;   Narrative Interpretation: Otherwise normal EKG. Stable mild scooped out ST segments in 1 and aVL. Would suggest digoxin effect, but she is not on digoxin   Other studies Reviewed: Additional studies/ records that were reviewed today include:  Recent Labs:   No results found for: CHOL, HDL, LDLCALC, LDLDIRECT, TRIG, CHOLHDL   ASSESSMENT / PLAN: Problem List Items Addressed This Visit    Transaminitis    Significant elevated liver enzymes during her hospital stay. The labs were improving. Unclear if this was potentially related to her Praluent --> if stable, can restart Praluent & follow LFTs closely.      Persistent atrial fibrillation (HCC) (Chronic)    Chemically cardioverted and maintaining sinus rhythm with  amiodarone. She is now on baseline standing dose of 200 mg daily. This is in conjunction with Lopressor 75 twice a day for additional rate control. Based on the fact that she was extremely symptomatic and  had cardiomyopathy with A. fib, I would continue amiodarone indefinitely for this.  She will need baseline amiodarone checks. On warfarin for anticoagulation.  This patients CHA2DS2-VASc Score and unadjusted Ischemic Stroke Rate (% per year) is equal to 11.2 % stroke rate/year from a score of 7  Above score calculated as 1 point each if present [CHF, HTN, DM, Vascular=MI/PAD/Aortic Plaque, Age if 65-74, or Female] Above score calculated as 2 points each if present [Age > 75, or Stroke/TIA/TE]        On amiodarone therapy (Chronic)    Maintaining sinus rhythm. We will need baseline then annual LFTs, TSH and PFTs. Also need annual eye exams.      Relevant Orders   EKG 12-Lead   Pulmonary function test   NICM (nonischemic cardiomyopathy) (HCC) - Primary (Chronic)    Related to atrial fibrillation. Notable improvement in EF. This may be as good as her EF will get. I would like to switch her from Lopressor twice a day to Toprol, but as she is doing very well right now, will continue as her EF has improved. Obviously now on ARB or ACE inhibitor because of renal insufficiency and probably lack of effectiveness. She is on low-dose hydralazine for afterload reduction.  Volume control by dialysis now. She is has not had any problems all with dyspnea or edema since.      Hyperlipidemia with target LDL less than 70 (Chronic)    She had been on Praluent for her lipids and was doing well, but had some issues with LFTs during the hospital stay and this was stopped. I think we need to wait to have a recheck of her lipid and LFTs. If things are stable, then we could probably restart Praluent.      Relevant Orders   EKG 12-Lead   Lipid Profile   CAD in native artery - s/p CABG 4 (Chronic)     Clearly stable from a cardiac standpoint as far as coronary disease goes. Cardiac cath ablation showed stable findings. She had a fixed anterior apical defect on Myoview in 2014 which is consistent with the wall motion abnormality on echocardiogram.  She is on beta blocker and hydralazine. She is on Plavix but not aspirin because she is on warfarin now. Has had major issues with most statins in the past.       Other Visit Diagnoses    Coagulation disorder (HCC)        Relevant Orders    INR/PT    Other fatigue        Relevant Orders    TSH    Medication management        Relevant Orders    COMPLETE METABOLIC PANEL WITH GFR       Current medicines are reviewed at length with the patient today. (+/- concerns) no new ?s  The following changes have been made: none   Studies Ordered:   Orders Placed This Encounter  Procedures  . COMPLETE METABOLIC PANEL WITH GFR  . Lipid Profile  . TSH  . INR/PT  . EKG 12-Lead  . Pulmonary function test      Marykay Lex, M.D., M.S. Interventional Cardiologist   Pager # [phone] Phone # [phone] [address] [place], [place] [address]

## 2015-09-11 ENCOUNTER — Ambulatory Visit (INDEPENDENT_AMBULATORY_CARE_PROVIDER_SITE_OTHER): Payer: Medicare Other | Admitting: Pharmacist Clinician (PhC)/ Clinical Pharmacy Specialist

## 2015-09-11 DIAGNOSIS — I481 Persistent atrial fibrillation: Secondary | ICD-10-CM

## 2015-09-11 DIAGNOSIS — Z5181 Encounter for therapeutic drug level monitoring: Secondary | ICD-10-CM

## 2015-09-11 DIAGNOSIS — I4819 Other persistent atrial fibrillation: Secondary | ICD-10-CM

## 2015-09-11 LAB — POCT INR: INR: 1.9

## 2015-09-12 ENCOUNTER — Encounter: Payer: Self-pay | Admitting: Cardiology

## 2015-09-12 DIAGNOSIS — Z79899 Other long term (current) drug therapy: Secondary | ICD-10-CM | POA: Insufficient documentation

## 2015-09-12 NOTE — Assessment & Plan Note (Signed)
Clearly stable from a cardiac standpoint as far as coronary disease goes. Cardiac cath ablation showed stable findings. She had a fixed anterior apical defect on Myoview in 2014 which is consistent with the wall motion abnormality on echocardiogram.  She is on beta blocker and hydralazine. She is on Plavix but not aspirin because she is on warfarin now. Has had major issues with most statins in the past.

## 2015-09-12 NOTE — Assessment & Plan Note (Signed)
She had been on Praluent for her lipids and was doing well, but had some issues with LFTs during the hospital stay and this was stopped. I think we need to wait to have a recheck of her lipid and LFTs. If things are stable, then we could probably restart Praluent.

## 2015-09-12 NOTE — Assessment & Plan Note (Signed)
Maintaining sinus rhythm. We will need baseline then annual LFTs, TSH and PFTs. Also need annual eye exams.

## 2015-09-12 NOTE — Assessment & Plan Note (Signed)
Related to atrial fibrillation. Notable improvement in EF. This may be as good as her EF will get. I would like to switch her from Lopressor twice a day to Toprol, but as she is doing very well right now, will continue as her EF has improved. Obviously now on ARB or ACE inhibitor because of renal insufficiency and probably lack of effectiveness. She is on low-dose hydralazine for afterload reduction.  Volume control by dialysis now. She is has not had any problems all with dyspnea or edema since.

## 2015-09-12 NOTE — Assessment & Plan Note (Addendum)
Chemically cardioverted and maintaining sinus rhythm with amiodarone. She is now on baseline standing dose of 200 mg daily. This is in conjunction with Lopressor 75 twice a day for additional rate control. Based on the fact that she was extremely symptomatic and had cardiomyopathy with A. fib, I would continue amiodarone indefinitely for this.  She will need baseline amiodarone checks. On warfarin for anticoagulation.  This patients CHA2DS2-VASc Score and unadjusted Ischemic Stroke Rate (% per year) is equal to 11.2 % stroke rate/year from a score of 7  Above score calculated as 1 point each if present [CHF, HTN, DM, Vascular=MI/PAD/Aortic Plaque, Age if 65-74, or Female] Above score calculated as 2 points each if present [Age > 75, or Stroke/TIA/TE]

## 2015-09-12 NOTE — Assessment & Plan Note (Signed)
Significant elevated liver enzymes during her hospital stay. The labs were improving. Unclear if this was potentially related to her Praluent --> if stable, can restart Praluent & follow LFTs closely.

## 2015-09-18 ENCOUNTER — Telehealth: Payer: Self-pay | Admitting: Pharmacist

## 2015-09-18 NOTE — Telephone Encounter (Signed)
[name] called from [facility] as she was unable to draw INR today. The order from home health is expiring this week.   Called patient and had her scheduled to have INR check in office on Monday. Instructed her to continue usual dose until appointment on Monday.

## 2015-09-21 ENCOUNTER — Ambulatory Visit (INDEPENDENT_AMBULATORY_CARE_PROVIDER_SITE_OTHER): Payer: Medicare Other | Admitting: Pharmacist Clinician (PhC)/ Clinical Pharmacy Specialist

## 2015-09-21 DIAGNOSIS — Z7901 Long term (current) use of anticoagulants: Secondary | ICD-10-CM

## 2015-09-21 DIAGNOSIS — I481 Persistent atrial fibrillation: Secondary | ICD-10-CM | POA: Diagnosis not present

## 2015-09-21 DIAGNOSIS — I4819 Other persistent atrial fibrillation: Secondary | ICD-10-CM

## 2015-09-21 LAB — POCT INR: INR: 1.5

## 2015-09-22 LAB — PROTIME-INR: INR: 1.9 — AB (ref 0.9–1.1)

## 2015-09-25 ENCOUNTER — Ambulatory Visit (HOSPITAL_COMMUNITY)
Admission: RE | Admit: 2015-09-25 | Discharge: 2015-09-25 | Disposition: A | Discharge status: Home / Self Care | Payer: Medicare Other | Source: Ambulatory Visit | Attending: Cardiology | Admitting: Cardiology

## 2015-09-25 DIAGNOSIS — Z5181 Encounter for therapeutic drug level monitoring: Secondary | ICD-10-CM | POA: Insufficient documentation

## 2015-09-25 DIAGNOSIS — Z79899 Other long term (current) drug therapy: Secondary | ICD-10-CM | POA: Diagnosis not present

## 2015-09-25 LAB — PULMONARY FUNCTION TEST
DL/VA % PRED: 77 %
DL/VA: 3.42 ml/min/mmHg/L
DLCO UNC % PRED: 54 %
DLCO unc: 10.95 ml/min/mmHg
FEF 25-75 POST: 1.3 L/s
FEF 25-75 PRE: 1.62 L/s
FEF2575-%CHANGE-POST: -19 %
FEF2575-%PRED-POST: 97 %
FEF2575-%Pred-Pre: 121 %
FEV1-%CHANGE-POST: -4 %
FEV1-%Pred-Post: 86 %
FEV1-%Pred-Pre: 91 %
FEV1-Post: 1.5 L
FEV1-Pre: 1.58 L
FEV1FVC-%Change-Post: 0 %
FEV1FVC-%PRED-PRE: 110 %
FEV6-%Change-Post: -4 %
FEV6-%Pred-Post: 83 %
FEV6-%Pred-Pre: 86 %
FEV6-POST: 1.83 L
FEV6-Pre: 1.91 L
FEV6FVC-%PRED-POST: 106 %
FEV6FVC-%Pred-Pre: 106 %
FVC-%CHANGE-POST: -4 %
FVC-%PRED-POST: 78 %
FVC-%PRED-PRE: 82 %
FVC-POST: 1.83 L
FVC-PRE: 1.92 L
POST FEV1/FVC RATIO: 82 %
PRE FEV1/FVC RATIO: 82 %
PRE FEV6/FVC RATIO: 100 %
Post FEV6/FVC ratio: 100 %
RV % pred: 68 %
RV: 1.51 L
TLC % pred: 77 %
TLC: 3.55 L

## 2015-09-25 MED ORDER — ALBUTEROL SULFATE (2.5 MG/3ML) 0.083% IN NEBU
2.5000 mg | INHALATION_SOLUTION | Freq: Once | RESPIRATORY_TRACT | Status: AC
  Administered 2015-09-25: 2.5 mg via RESPIRATORY_TRACT

## 2015-09-28 ENCOUNTER — Ambulatory Visit (INDEPENDENT_AMBULATORY_CARE_PROVIDER_SITE_OTHER): Payer: Medicare Other | Admitting: Pharmacist Clinician (PhC)/ Clinical Pharmacy Specialist

## 2015-09-28 DIAGNOSIS — I4819 Other persistent atrial fibrillation: Secondary | ICD-10-CM

## 2015-09-28 DIAGNOSIS — Z7901 Long term (current) use of anticoagulants: Secondary | ICD-10-CM | POA: Diagnosis not present

## 2015-09-28 DIAGNOSIS — I481 Persistent atrial fibrillation: Secondary | ICD-10-CM

## 2015-09-30 ENCOUNTER — Encounter: Payer: Medicare Other | Admitting: Pharmacist Clinician (PhC)/ Clinical Pharmacy Specialist

## 2015-10-02 ENCOUNTER — Telehealth: Payer: Self-pay | Admitting: Cardiology

## 2015-10-02 NOTE — Telephone Encounter (Signed)
Result are not available at present Aware will contact patient and daughter  Of results Daughter need to be added to DPI-- LIST

## 2015-10-02 NOTE — Telephone Encounter (Signed)
New message      Calling to get PFT results from 1 week ago

## 2015-10-05 ENCOUNTER — Other Ambulatory Visit: Payer: Self-pay | Admitting: Cardiovascular Disease

## 2015-10-05 NOTE — Telephone Encounter (Signed)
Rx request sent to pharmacy.  

## 2015-10-06 LAB — PROTIME-INR: INR: 1.8 — AB (ref 0.9–1.1)

## 2015-10-12 ENCOUNTER — Ambulatory Visit (INDEPENDENT_AMBULATORY_CARE_PROVIDER_SITE_OTHER): Payer: Medicare Other | Admitting: Pharmacist

## 2015-10-12 DIAGNOSIS — Z7901 Long term (current) use of anticoagulants: Secondary | ICD-10-CM

## 2015-10-12 DIAGNOSIS — I481 Persistent atrial fibrillation: Secondary | ICD-10-CM

## 2015-10-12 DIAGNOSIS — I4819 Other persistent atrial fibrillation: Secondary | ICD-10-CM

## 2015-10-14 ENCOUNTER — Encounter: Payer: Self-pay | Admitting: Vascular Surgery

## 2015-10-16 ENCOUNTER — Ambulatory Visit (HOSPITAL_COMMUNITY)
Admission: RE | Admit: 2015-10-16 | Discharge: 2015-10-16 | Disposition: A | Discharge status: Home / Self Care | Payer: Medicare Other | Source: Ambulatory Visit | Attending: Vascular Surgery | Admitting: Vascular Surgery

## 2015-10-16 DIAGNOSIS — I509 Heart failure, unspecified: Secondary | ICD-10-CM | POA: Diagnosis not present

## 2015-10-16 DIAGNOSIS — N186 End stage renal disease: Secondary | ICD-10-CM | POA: Diagnosis not present

## 2015-10-16 DIAGNOSIS — K219 Gastro-esophageal reflux disease without esophagitis: Secondary | ICD-10-CM | POA: Diagnosis not present

## 2015-10-16 DIAGNOSIS — Z992 Dependence on renal dialysis: Secondary | ICD-10-CM | POA: Insufficient documentation

## 2015-10-16 DIAGNOSIS — Z4931 Encounter for adequacy testing for hemodialysis: Secondary | ICD-10-CM | POA: Diagnosis not present

## 2015-10-16 DIAGNOSIS — I132 Hypertensive heart and chronic kidney disease with heart failure and with stage 5 chronic kidney disease, or end stage renal disease: Secondary | ICD-10-CM | POA: Insufficient documentation

## 2015-10-16 DIAGNOSIS — E785 Hyperlipidemia, unspecified: Secondary | ICD-10-CM | POA: Insufficient documentation

## 2015-10-20 ENCOUNTER — Ambulatory Visit (INDEPENDENT_AMBULATORY_CARE_PROVIDER_SITE_OTHER): Payer: Self-pay | Admitting: Vascular Surgery

## 2015-10-20 ENCOUNTER — Encounter: Payer: Self-pay | Admitting: Vascular Surgery

## 2015-10-20 ENCOUNTER — Encounter: Payer: Medicare Other | Admitting: Vascular Surgery

## 2015-10-20 VITALS — BP 173/75 | HR 56 | Temp 97.9°F | Resp 16 | Ht 63.0 in | Wt 160.0 lb

## 2015-10-20 DIAGNOSIS — N186 End stage renal disease: Secondary | ICD-10-CM

## 2015-10-20 NOTE — Progress Notes (Signed)
Subjective:     Patient ID: [name], female   DOB: 04-Feb-1936, 80 y.o.   MRN: [id number]  HPI this 80 year old female with end-stage renal disease returns today for follow-up regarding her left radial-cephalic AV fistula created 09/02/2015. She denies any pain or numbness in the left hand. She is currently being dialyzed through a tunneled catheter in the left IJ.   Review of Systems     Objective:   Physical Exam BP 173/75 mmHg  Pulse 56  Temp(Src) 97.9 F (36.6 C)  Resp 16  Ht 5\' 3"  (1.6 m)  Wt 160 lb (72.576 kg)  BMI 28.35 kg/m2  Gen. well-developed well-nourished female no apparent distress alert and oriented 3 Lungs no rhonchi or wheezing Left upper extremity with well-healed distal forearm incision. Left and well perfused. Pulse and palpable thrill in cephalic vein up to the antecubital area. Vein is slightly deep.  Today I ordered a duplex scan of the fistula which I reviewed and interpreted. There are no significant side branches. The vein appears to be satisfactory caliber. The arterial anastomosis is widely patent but there is calcific disease in the radial artery.     Assessment:     Widely patent left radial-cephalic AV fistula created 09/02/2015    Plan:     Okay to attempt to utilize fistula on 12/02/2015. Hopefully this will be satisfactory and catheter can be removed shortly thereafter. Patient to return to see [place] on when necessary basis

## 2015-10-20 NOTE — Progress Notes (Signed)
Filed Vitals:   10/20/15 0839 10/20/15 0842  BP: 162/65 173/75  Pulse: 59 56  Temp: 97.9 F (36.6 C)   Resp: 16   Height: 5\' 3"  (1.6 m)   Weight: 160 lb (72.576 kg)

## 2015-10-27 ENCOUNTER — Other Ambulatory Visit: Payer: Self-pay | Admitting: Cardiology

## 2015-11-13 ENCOUNTER — Ambulatory Visit (INDEPENDENT_AMBULATORY_CARE_PROVIDER_SITE_OTHER): Payer: Medicare Other | Admitting: Pharmacist Clinician (PhC)/ Clinical Pharmacy Specialist

## 2015-11-13 ENCOUNTER — Encounter: Payer: Self-pay | Admitting: Cardiology

## 2015-11-13 ENCOUNTER — Ambulatory Visit (INDEPENDENT_AMBULATORY_CARE_PROVIDER_SITE_OTHER): Payer: Medicare Other | Admitting: Cardiology

## 2015-11-13 VITALS — BP 120/52 | HR 39 | Ht 63.0 in | Wt 157.2 lb

## 2015-11-13 DIAGNOSIS — E785 Hyperlipidemia, unspecified: Secondary | ICD-10-CM

## 2015-11-13 DIAGNOSIS — R740 Nonspecific elevation of levels of transaminase and lactic acid dehydrogenase [LDH]: Secondary | ICD-10-CM

## 2015-11-13 DIAGNOSIS — I4819 Other persistent atrial fibrillation: Secondary | ICD-10-CM

## 2015-11-13 DIAGNOSIS — Z7901 Long term (current) use of anticoagulants: Secondary | ICD-10-CM | POA: Diagnosis not present

## 2015-11-13 DIAGNOSIS — I481 Persistent atrial fibrillation: Secondary | ICD-10-CM | POA: Diagnosis not present

## 2015-11-13 DIAGNOSIS — I2510 Atherosclerotic heart disease of native coronary artery without angina pectoris: Secondary | ICD-10-CM

## 2015-11-13 DIAGNOSIS — R7401 Elevation of levels of liver transaminase levels: Secondary | ICD-10-CM

## 2015-11-13 DIAGNOSIS — I701 Atherosclerosis of renal artery: Secondary | ICD-10-CM | POA: Diagnosis not present

## 2015-11-13 DIAGNOSIS — I10 Essential (primary) hypertension: Secondary | ICD-10-CM

## 2015-11-13 DIAGNOSIS — I429 Cardiomyopathy, unspecified: Secondary | ICD-10-CM

## 2015-11-13 DIAGNOSIS — Z5181 Encounter for therapeutic drug level monitoring: Secondary | ICD-10-CM

## 2015-11-13 DIAGNOSIS — Z79899 Other long term (current) drug therapy: Secondary | ICD-10-CM | POA: Diagnosis not present

## 2015-11-13 DIAGNOSIS — I428 Other cardiomyopathies: Secondary | ICD-10-CM

## 2015-11-13 DIAGNOSIS — R001 Bradycardia, unspecified: Secondary | ICD-10-CM

## 2015-11-13 LAB — C-REACTIVE PROTEIN: CRP: 3.5 mg/dL — AB (ref <0.60)

## 2015-11-13 LAB — POCT INR: INR: 1.5

## 2015-11-13 LAB — SEDIMENTATION RATE: Sed Rate: 27 mm/hr (ref 0–30)

## 2015-11-13 MED ORDER — HYDRALAZINE HCL 25 MG PO TABS: ORAL_TABLET | ORAL | Status: AC

## 2015-11-13 NOTE — Patient Instructions (Signed)
Your physician has recommended you make the following change in your medication...  1. STOP plavix 2. STOP metoprolol 3. INCREASE hydralazine to 25mg  twice daily - you can take 1 extra tablet daily in the morning if your systolic BP is greater than 160  Your physician recommends that you return for lab work - you can get this done the same day as your next coumadin check   Your physician recommends that you schedule a follow-up appointment in: THREE MONTHS

## 2015-11-13 NOTE — Progress Notes (Signed)
PCP: Dwan Bolt, MD  Clinic Note: Chief Complaint  Patient presents with  . Follow-up    CAD-CABG  . Atrial Fibrillation  . Cardiomyopathy    HPI: [name] is a 80 y.o. female with a PMH below who presents today for Two-month follow-up of her CAD, cardiomyopathy and atrial fibrillation. She also has a history of PAD status post right femoropopliteal bypass, hypertension, hyperlipidemia and end-stage renal disease on dialysis also with a history of renal artery stenting in 2015. Third hospital follow-up.  [name] was last seen on 09/10/2015.  Recent Hospitalizations: None  Studies Reviewed: PFTs:  Interpretation: The FVC, FEV1, FEV1/FVC ratio and FEF25-75% are within normal limits. The airway resistance is normal. The TLC or alveolar volume is reduced, but the SVC is within normal limits. Following administration of bronchodilators, there is no significant response. The reduced diffusing capacity indicates a moderately severe loss of functional alveolar capillary surface. However, the diffusing capacity was not corrected for the patient's hemoglobin.  Conclusions: The reduced lung volumes, increased FEV1/FVC ratio and diffusion defect suggest an interstitial process such as fibrosis or interstitial inflammation. Anemia cannot be excluded as a potential cause of the diffusion defect without correcting the observed diffusing capacity for hemoglobin. In view of the severity of the diffusion defect, studies with exercise would be helpful to evaluate the presence of hypoxemia. Pulmonary Function Diagnosis: Minimal Restriction -Interstitial Moderately severe Diffusion Defect   Interval History: [name] presents today again feeling quite well overall. She had questions about her PFTs, , which showed some evidence of potential interstitial fibrosis from chronic lung disease. My suspicion is this is probably related to COPD, since she was a long-term smoker. He  still has much improved dyspnea since starting on her amiodarone and being out of A. fib. She denies any sensation of rapid irregular heartbeat she denies any slow heartbeats. No dizziness or wooziness. She has been taking amiodarone routinely, but took metoprolol for the first day today. Mostly she is been holding it because of being unsure of how to take it. She denies any blurred vision or headaches/wooziness or dizziness. No chest tightness pressure with rest or exertion or sensation of rapid irregular heartbeats. No TIA or amaurosis fugax symptoms. No melena, hematochezia hematuria. Minimal edema, still has some varicosities distally. Her blood pressure still remained labile to figure out.  No claudication.  ROS: A comprehensive was performed. Review of Systems  Constitutional: Negative for malaise/fatigue.  HENT: Negative for congestion and nosebleeds.   Respiratory: Positive for cough (Nonetheless productive and less frequent). Negative for sputum production, shortness of breath and wheezing.   Cardiovascular: Positive for leg swelling.  Gastrointestinal: Negative for constipation.  Musculoskeletal: Positive for joint pain. Negative for myalgias and falls.  Neurological: Negative for dizziness (Occasionally postdialysis) and headaches.  Endo/Heme/Allergies: Bruises/bleeds easily.  Psychiatric/Behavioral: Negative for depression and memory loss. The patient is not nervous/anxious and does not have insomnia.   All other systems reviewed and are negative.    Past Medical History  Diagnosis Date  . CAD in native artery 2007; 08/2015:     a. S/p CABG 2007 (LIMA-LAD, SVG-dRCA, SVG-intermediate, SVG-diagonal). b. Nuclear stress test 08/2012: normal, low risk.; c. RI 100%, o-mRCA 100%, mLAD 60%, dLAD 70%, oD1 99%. LIMA-LAD patent, SVG-PRAV patent, SVG-RI patent, SVG-D1 ~50% dSVG. EDP 26 mHg  . S/P CABG x 4 2007;     LIMA-LAD, SVG-Distal RCA, SVG-Ramus, SVG-Diagonal; no echocardiogram done  .  PAD (peripheral artery disease) ([place]) 1997  Mild-moderate carotid disease; status post right SFA occlusion with Fem-Below Knee Pop bypass (Dr. Kellie Simmering); LEA Dopplers 07/2014: RIGHT - ABI 0.84, CIA/EIA 50-69%, CFA/PFA patent Fem-Pop bypass patent w/ 70-99% anastomotic stenosis, patent Pop A with 3 V runoff; LABI 0.85- L CIA/EIA patent, LPFA 70-99%, dLSFA 50-69%, patent LPopA & 3 V runoff.  . Renovascular hypertension 12/17/2013    a. s/p L RA Stent 11/2013. b. Renal duplex 02/2015: >60% right proximal renal artery stenosis, normal left renal artery s/p stent, f/u 1 yr recommended.  . CKD (chronic kidney disease), stage III   . Essential hypertension   . Hyperlipidemia LDL goal <70   . Carotid artery disease ([place])     a. Mild-mod carotid disease per Dr.Berry's note.  . Anemia   . Pneumonia   . CHF (congestive heart failure) (HCC)     Combined systolic and diastolic  . A-fib (HCC)     On amiodarone, warfarin  . Myocardial infarction ([place])     In setting of pneumonia and A. fib with RVR  . GERD (gastroesophageal reflux disease)   . End-stage renal disease on hemodialysis ([place]) 07/19/2015  . Congestive dilated cardiomyopathy [facility]) February 2017-April 2017    Combination of ischemic and atrial fibrillation related -> EF improved from 20-25% up to 40-45% by recent echo.  . Abnormal lung function test     Minimal restriction-interstitial, moderate-severe diffusion defect. Reduced lung volumes with increased FEV1/FVC ratio and diffusion defect suggest an interstitial process such as fibrosis or interstitial inflammation.    Past Surgical History  Procedure Laterality Date  . Cardiac catheterization  December 2007    LAD-90% mid, D19 percent ostial. Circumflex-OM1 90% ostial, 80% mid. RCA 9% proximal, 100% mid  . Femoropopliteal bypass Right 1997    Dr. Kellie Simmering: SFA-below knee Pop  . Coronary artery bypass graft  December 2007    LIMA-LAD, SVG-distal RCA, SVG-D1, SVG-OM1/Ramus  . Nm myoview ltd   April 2014    EF 63%, mild fixed anteroapical defect thought to be breast attenuation  . Appendectomy    . Tonsillectomy    . Cataract surgery    . Rectovaginal fistula repair  1997  . [facility]  1981  . Renal artery stent Left 12/16/13    using CO2  . Lower extremity venous doppler  06/13/2011    No evidence of thrombus or thrombophlebitis, no venous insuffiency noted.  . Cardiovascular stress test  08/28/2012    Normal stress nuclear study with likely breast attenuation, low risk stress test.  . Renal angiogram N/A 12/16/2013    Procedure: RENAL ANGIOGRAM;  Surgeon: Lorretta Harp, MD;  Location: [facility] CATH LAB;  Service: Cardiovascular;  Laterality: N/A;  . Percutaneous stent intervention Left 12/16/2013    Procedure: PERCUTANEOUS STENT INTERVENTION - LEFT RENAL ARTERY;  Surgeon: Lorretta Harp, MD;  Location: [facility] CATH LAB;  Service: Cardiovascular;  Laterality: Left;  renal  . Cardiac catheterization N/A 07/09/2015    Procedure: Right Heart Cath;  Surgeon: Jolaine Artist, MD;  Location: [facility] CV LAB;  Service: Cardiovascular;  Laterality: N/A;  . Cardiac catheterization N/A 07/13/2015    Procedure: Left Heart Cath and Cors/Grafts Angiography;  Surgeon: Leonie Man, MD;  Location: [place] CV LAB;  Service: Cardiovascular:  RI 100%, o-mRCA 100%, mLAD 60%, dLAD 70%, oD1 99%. LIMA-LAD patent, SVG-PRAV patent, SVG-RI patent, SVG-D1 ~50% dSVG. EDP 26 mHg  . Peripheral vascular catheterization N/A 07/13/2015    Procedure: Renal Angiography;  Surgeon: Shanon Brow  Loren Racer, MD;  Location: [place] CV LAB;  Service: Cardiovascular: Bilateral renal arteries are patent  . Back surgery    . Colonoscopy    . Abdominal hysterectomy    . Av fistula placement Left 09/02/2015    Procedure: RADIAL - CEPHALIC ARTERIOVENOUS (AV) FISTULA CREATION LEFT ARM;  Surgeon: Mal Misty, MD;  Location: [place];  Service: Vascular;  Laterality: Left;  . Transthoracic echocardiogram  08/26/2015    EF 45-50%  mild concentric Vertebrae. Anteroseptal hypokinesis. Pseudo-normal/grade 2 diastolic dysfunction. Moderate AI. Mild MR. Severe LA dilation. Mild RV dilation. Mild TR. Significant improvement in LVEF.  Cath 08/2015  LVEDP 26 mmHg. No new culprit lesion.   Prior to Admission medications   Medication Sig Start Date End Date Taking? Authorizing Provider  acetaminophen (TYLENOL) 325 MG tablet Take 650 mg by mouth every 6 (six) hours as needed for moderate pain.   Yes Historical Provider, MD  amiodarone (PACERONE) 200 MG tablet Take 1 tablet (200 mg total) by mouth daily. 400 mg daily for 1 week then decrease to 200 mg daily Patient taking differently: Take 200 mg by mouth daily.  08/14/15  Yes Liliane Shi, PA-C  betamethasone dipropionate (DIPROLENE) 0.05 % cream Apply 1 application topically 2 (two) times daily as needed. ECZEMA   Yes Historical Provider, MD  clopidogrel (PLAVIX) 75 MG tablet TAKE 1 TABLET (75 MG TOTAL) BY MOUTH DAILY WITH BREAKFAST. 10/05/15  Yes Lorretta Harp, MD  colchicine 0.6 MG tablet Take 0.6 mg by mouth 2 (two) times daily as needed. GOUT FLARE UP.   Yes Historical Provider, MD  febuxostat (ULORIC) 40 MG tablet Take 40 mg by mouth daily.   Yes Historical Provider, MD  hydrALAZINE (APRESOLINE) 10 MG tablet Take 1 tablet (10 mg total) by mouth every 8 (eight) hours. 08/31/15  Yes Leonie Man, MD  LORazepam (ATIVAN) 0.5 MG tablet Take 0.5 mg by mouth 4 (four) times daily as needed for anxiety.   Yes Historical Provider, MD  metoprolol (LOPRESSOR) 50 MG tablet Take 1.5 tablets (75 mg total) by mouth 2 (two) times daily. Hold if HR below 50 or if SBP below 110 08/14/15  Yes Scott T Weaver, PA-C  Multiple Vitamins-Minerals (ICAPS) CAPS Take 1 capsule by mouth 2 (two) times daily.   Yes Historical Provider, MD  multivitamin (RENA-VIT) TABS tablet Take 1 tablet by mouth daily.   Yes Historical Provider, MD  Nutritional Supplements (FEEDING SUPPLEMENT, NEPRO CARB STEADY,) LIQD  Take 237 mLs by mouth 2 (two) times daily between meals. 07/19/15  Yes Nishant Dhungel, MD  omeprazole (PRILOSEC) 20 MG capsule Take 20 mg by mouth daily.   Yes Historical Provider, MD  warfarin (COUMADIN) 2.5 MG tablet Take 1 tablet (2.5 mg total) by mouth daily. Take 1 tablet by mouth daily or as directed by coumadin clinic 09/02/15  Yes Samantha J Rhyne, PA-C  warfarin (COUMADIN) 2.5 MG tablet Take 1-1.5 tablets by mouth daily as directed by coumadin clinic 10/27/15  Yes Leonie Man, MD     Allergies  Allergen Reactions  . Latex Swelling  . Statins Other (See Comments)    CLASS REACTION:  DIFFUSE CRAMPING Includes Lipitor, Crestor, Pravachol and simvastatin  . Angiotensin Receptor Blockers Itching and Rash  . Benicar [Olmesartan] Itching and Rash     Social History   Social History  . Marital Status: Married    Spouse Name: N/A  . Number of Children: N/A  . Years of Education:  N/A   Social History Main Topics  . Smoking status: Former Smoker    Types: Cigarettes    Quit date: 05/31/1993  . Smokeless tobacco: Never Used  . Alcohol Use: 4.2 oz/week    7 Glasses of wine per week     Comment: 4 shots of whiskey per night; denies,  last use 2/10/ 17  . Drug Use: No  . Sexual Activity: Not Asked   Other Topics Concern  . None   Social History Narrative   She is a married mother of 4 with one child that died at a young age. She has 11 grandchildren and 11 great grandchildren. She is not excessively active. She does get around the house. No routine exercise   She quit smoking in 1993, and has a social alcohol beverage on occasion.   She is a former Hotel manager working as a Lawyer.   Family History  Problem Relation Age of Onset  . Breast cancer Mother   . Heart attack Father   . Cancer - Cervical Brother     Wt Readings from Last 3 Encounters:  11/13/15 157 lb 3.2 oz (71.305 kg)  10/20/15 160 lb (72.576 kg)  09/10/15 158 lb (71.668 kg)     PHYSICAL EXAM BP 120/52 mmHg  Pulse 39  Ht 5\' 3"  (1.6 m)  Wt 157 lb 3.2 oz (71.305 kg)  BMI 27.85 kg/m2  SpO2 99% General appearance: alert, cooperative, appears stated age, no distress and Well-nourished, well-groomed. Healthy-appearing. Neck: no adenopathy, no carotid bruit and no JVD Lungs: clear to auscultation bilaterally, normal percussion bilaterally and non-labored Heart: RRR, normal S1 and S2 with 1-2/6 HSM at apex and left lower sternal border. Unable to really hear a diastolic murmur. Nondisplaced PMI. Abdomen: soft, non-tender; bowel sounds normal; no masses, no organomegaly;  Extremities: extremities normal, atraumatic, no cyanosis, and no edema; left IJ dialysis catheter Pulses: Mildly diminished bilateral lower extremity pulses, palpable Skin: mobility and turgor normal and Several areas of healing bruising. She has the fistula in the left arm recently started. Lots of bruising from that. Neurologic: Mental status: Alert, oriented, thought content appropriate; but does have a little difficulty with answering questions and remembering things. Cranial nerves: normal (II-XII grossly intact)    Adult ECG Report  Rate: 39 ;  Rhythm: sinus bradycardia and 1 AV block. Left axis deviation (-31). Cannot exclude Anterior infarct, age undetermined.  Narrative Interpretation: Markedly sinus bradycardia.   Other studies Reviewed: Additional studies/ records that were reviewed today include:  Recent Labs: 10/30/2015  Na+ 133, K+ 3.5, Cl- 96, HCO3- 31 , BUN 16, Cr 2.7, Glu 98, Ca2+ 8.8; AST 49, ALT 64, AlkP 87, Alb 3.5, TP 6.7, T Bili 0.6  CBC: W 5.9, H/H 10.4/32.4 Plt 290  TC 214 (up from 114 last year), TG 126, HDL 63, LDL 126 (up from 29) --> since being off Praluent    ASSESSMENT / PLAN: Problem List Items Addressed This Visit    Transaminitis    Pretty much resolved now almost back to baseline. I think is fine for her to restart Praluent. This is being monitored by  her PCP.      RAS (renal artery stenosis) (HCC)    Status post renal stent. Now actually with end-stage renal disease, this is less important.      Relevant Medications   hydrALAZINE (APRESOLINE) 25 MG tablet   Persistent atrial fibrillation (HCC) (Chronic)    Currently in sinus bradycardia and  is profoundly bradycardic. I can tell that she's had a recurrence of A. fib since she can be cardioverted in the hospital. This is deathly lead to improvement in her EF by echocardiogram. This patients CHA2DS2-VASc Score and unadjusted Ischemic Stroke Rate (% per year) is equal to 11.2 % stroke rate/year from a score of 7  continue with warfarin. Needs INR check today. Careful dosing while she is also on amiodarone.   We'll stop Plavix  Remains on amiodarone for rate/rhythm control. With current bradycardia and first-degree AV block, think simply just to stop the beta blocker. We can reassess her heart rate later.       Relevant Medications   hydrALAZINE (APRESOLINE) 25 MG tablet   Other Relevant Orders   EKG 12-Lead   On amiodarone therapy (Chronic)    Need to continue following TFTs and LFTs. PFT showed relatively significant lung disease, but likely chronic since amiodarone was just started. Aorta evaluate for formation, we will check sedimentation rate and ESR. If these are elevated appreciably, would consider an inflammatory process causing discomfort.      NICM (nonischemic cardiomyopathy) (HCC) (Chronic)    Certainly at least in part related to ischemic, but the recent exacerbation was probably related to atrial fibrillation - improved EF after cardioversion chemically. Not on ACE inhibitor/ARB for afterload reduction, and is on minimal dose of hydralazine. Since it is more hypertensive issues rate control as she'll will increase hydralazine to 25 twice a day with an additional dose when necessary for elevated blood pressure. We can stop Lopressor because of bradycardia.      Relevant  Medications   hydrALAZINE (APRESOLINE) 25 MG tablet   Other Relevant Orders   EKG 12-Lead   Long term (current) use of anticoagulants [Z79.01] (Chronic)    Presents routinely for anticoagulation clinic. Seems to be doing relatively well.      Hypertension, essential (Chronic)    Hypertension well controlled. It was a little bit elevated on previous check. With profound bradycardia I'm stopping Lopressor. Will simply then increase hydralazine from 10 3 times a day did 25 twice a day. When necessary dosing recommended.      Relevant Medications   hydrALAZINE (APRESOLINE) 25 MG tablet   Hyperlipidemia with target LDL less than 70 (Chronic)    Liver function looks like it is pretty much back to normal. He is plugged 5 vertigo back on Praluent. This is being monitored by her PCP. Would like to keep appraised of any pertinent labs drawn.      Relevant Medications   hydrALAZINE (APRESOLINE) 25 MG tablet   CAD in native artery - s/p CABG 4 (Chronic)    Patent grafts with moderate disease and some smaller vessels noted on recent As indicated in the medical history and above. No active anginal symptoms. She has not had any PCI for coronary disease in over a year. Repeat Coumadin, the results simply to stop Plavix, we may go back to aspirin in the future but for now stay off of Plavix.       Relevant Medications   hydrALAZINE (APRESOLINE) 25 MG tablet   Other Relevant Orders   EKG 12-Lead   Bradycardia (Chronic)    Discontinue metoprolol. Would continue amiodarone for now, if heart rate and easy slower we may need to go to his back off to 100 mg instead of 200 mg      Relevant Orders   EKG 12-Lead    Other Visit Diagnoses    Encounter  for monitoring amiodarone therapy    -  Primary    Relevant Orders    Sed Rate (ESR) (Completed)    C-reactive protein (Completed)    EKG 12-Lead       Current medicines are reviewed at length with the patient today. (+/- concerns) PCP is asking  when she go back on Praluent. Also unclear how to take blood pressure medicines. The following changes have been made:  1. STOP plavix 2. STOP metoprolol 3. INCREASE hydralazine to '25mg'$  twice daily - you can take 1 extra tablet daily in the morning if your systolic BP is greater than 160   Studies Ordered:   Orders Placed This Encounter  Procedures  . Sed Rate (ESR)  . C-reactive protein  . EKG 12-Lead    ROV 3 months     Glenetta Hew, M.D., M.S. Interventional Cardiologist   Pager # [phone] Phone # [phone] [address] [place], [place] [address]

## 2015-11-14 ENCOUNTER — Encounter: Payer: Self-pay | Admitting: Cardiology

## 2015-11-14 DIAGNOSIS — R001 Bradycardia, unspecified: Secondary | ICD-10-CM | POA: Insufficient documentation

## 2015-11-14 NOTE — Assessment & Plan Note (Signed)
Need to continue following TFTs and LFTs. PFT showed relatively significant lung disease, but likely chronic since amiodarone was just started. Aorta evaluate for formation, we will check sedimentation rate and ESR. If these are elevated appreciably, would consider an inflammatory process causing discomfort.

## 2015-11-14 NOTE — Assessment & Plan Note (Signed)
Certainly at least in part related to ischemic, but the recent exacerbation was probably related to atrial fibrillation - improved EF after cardioversion chemically. Not on ACE inhibitor/ARB for afterload reduction, and is on minimal dose of hydralazine. Since it is more hypertensive issues rate control as she'll will increase hydralazine to 25 twice a day with an additional dose when necessary for elevated blood pressure. We can stop Lopressor because of bradycardia.

## 2015-11-14 NOTE — Assessment & Plan Note (Signed)
Hypertension well controlled. It was a little bit elevated on previous check. With profound bradycardia I'm stopping Lopressor. Will simply then increase hydralazine from 10 3 times a day did 25 twice a day. When necessary dosing recommended.

## 2015-11-14 NOTE — Assessment & Plan Note (Signed)
Currently in sinus bradycardia and is profoundly bradycardic. I can tell that she's had a recurrence of A. fib since she can be cardioverted in the hospital. This is deathly lead to improvement in her EF by echocardiogram. This patients CHA2DS2-VASc Score and unadjusted Ischemic Stroke Rate (% per year) is equal to 11.2 % stroke rate/year from a score of 7  continue with warfarin. Needs INR check today. Careful dosing while she is also on amiodarone.   We'll stop Plavix  Remains on amiodarone for rate/rhythm control. With current bradycardia and first-degree AV block, think simply just to stop the beta blocker. We can reassess her heart rate later.

## 2015-11-14 NOTE — Assessment & Plan Note (Signed)
Pretty much resolved now almost back to baseline. I think is fine for her to restart Praluent. This is being monitored by her PCP.

## 2015-11-14 NOTE — Assessment & Plan Note (Signed)
Status post renal stent. Now actually with end-stage renal disease, this is less important.

## 2015-11-14 NOTE — Assessment & Plan Note (Signed)
Patent grafts with moderate disease and some smaller vessels noted on recent As indicated in the medical history and above. No active anginal symptoms. She has not had any PCI for coronary disease in over a year. Repeat Coumadin, the results simply to stop Plavix, we may go back to aspirin in the future but for now stay off of Plavix.

## 2015-11-14 NOTE — Assessment & Plan Note (Signed)
Discontinue metoprolol. Would continue amiodarone for now, if heart rate and easy slower we may need to go to his back off to 100 mg instead of 200 mg

## 2015-11-14 NOTE — Assessment & Plan Note (Signed)
Liver function looks like it is pretty much back to normal. He is plugged 5 vertigo back on Praluent. This is being monitored by her PCP. Would like to keep appraised of any pertinent labs drawn.

## 2015-11-14 NOTE — Assessment & Plan Note (Signed)
Presents routinely for anticoagulation clinic. Seems to be doing relatively well.

## 2015-11-18 LAB — PROTIME-INR: INR: 2.5 — AB (ref 0.9–1.1)

## 2015-11-20 ENCOUNTER — Telehealth: Payer: Self-pay | Admitting: *Deleted

## 2015-11-20 NOTE — Telephone Encounter (Signed)
Spoke to patient , information given Patient states please call daughter with information spoke to daughter  Result given- daughter request only give phone her with information - patient forgets Changes phone number to reflect daughter's number

## 2015-11-20 NOTE — Telephone Encounter (Signed)
-----   Message from Marykay Lex, MD sent at 11/16/2015  3:34 PM EDT ----- Only minimally elevated evidence of inflammation. I don't think this would be consistent with amiodarone toxicity. I think the findings on PFTs are based on her existing lung disease.   Bryan Lemma, MD

## 2015-11-23 ENCOUNTER — Ambulatory Visit (INDEPENDENT_AMBULATORY_CARE_PROVIDER_SITE_OTHER): Payer: Medicare Other | Admitting: Pharmacist Clinician (PhC)/ Clinical Pharmacy Specialist

## 2015-11-23 DIAGNOSIS — I4819 Other persistent atrial fibrillation: Secondary | ICD-10-CM

## 2015-11-23 DIAGNOSIS — Z7901 Long term (current) use of anticoagulants: Secondary | ICD-10-CM

## 2015-11-23 DIAGNOSIS — I481 Persistent atrial fibrillation: Secondary | ICD-10-CM

## 2015-11-28 ENCOUNTER — Inpatient Hospital Stay (HOSPITAL_COMMUNITY)
Admission: EM | Admit: 2015-11-28 | Discharge: 2015-12-03 | DRG: 871 | Disposition: A | Discharge status: Home / Self Care | Payer: Medicare Other | Attending: Internal Medicine | Admitting: Internal Medicine

## 2015-11-28 ENCOUNTER — Encounter (HOSPITAL_COMMUNITY): Payer: Self-pay | Admitting: Emergency Medicine

## 2015-11-28 DIAGNOSIS — Z992 Dependence on renal dialysis: Secondary | ICD-10-CM

## 2015-11-28 DIAGNOSIS — I739 Peripheral vascular disease, unspecified: Secondary | ICD-10-CM | POA: Diagnosis present

## 2015-11-28 DIAGNOSIS — A419 Sepsis, unspecified organism: Principal | ICD-10-CM | POA: Diagnosis present

## 2015-11-28 DIAGNOSIS — I5022 Chronic systolic (congestive) heart failure: Secondary | ICD-10-CM | POA: Diagnosis present

## 2015-11-28 DIAGNOSIS — N2581 Secondary hyperparathyroidism of renal origin: Secondary | ICD-10-CM | POA: Diagnosis present

## 2015-11-28 DIAGNOSIS — I252 Old myocardial infarction: Secondary | ICD-10-CM

## 2015-11-28 DIAGNOSIS — I2510 Atherosclerotic heart disease of native coronary artery without angina pectoris: Secondary | ICD-10-CM | POA: Diagnosis present

## 2015-11-28 DIAGNOSIS — M109 Gout, unspecified: Secondary | ICD-10-CM | POA: Diagnosis present

## 2015-11-28 DIAGNOSIS — I132 Hypertensive heart and chronic kidney disease with heart failure and with stage 5 chronic kidney disease, or end stage renal disease: Secondary | ICD-10-CM | POA: Diagnosis present

## 2015-11-28 DIAGNOSIS — Z8249 Family history of ischemic heart disease and other diseases of the circulatory system: Secondary | ICD-10-CM

## 2015-11-28 DIAGNOSIS — J440 Chronic obstructive pulmonary disease with acute lower respiratory infection: Secondary | ICD-10-CM | POA: Diagnosis present

## 2015-11-28 DIAGNOSIS — Z79899 Other long term (current) drug therapy: Secondary | ICD-10-CM

## 2015-11-28 DIAGNOSIS — I481 Persistent atrial fibrillation: Secondary | ICD-10-CM | POA: Diagnosis present

## 2015-11-28 DIAGNOSIS — D631 Anemia in chronic kidney disease: Secondary | ICD-10-CM | POA: Diagnosis present

## 2015-11-28 DIAGNOSIS — Y95 Nosocomial condition: Secondary | ICD-10-CM | POA: Diagnosis present

## 2015-11-28 DIAGNOSIS — J154 Pneumonia due to other streptococci: Secondary | ICD-10-CM | POA: Diagnosis present

## 2015-11-28 DIAGNOSIS — I420 Dilated cardiomyopathy: Secondary | ICD-10-CM | POA: Diagnosis present

## 2015-11-28 DIAGNOSIS — F419 Anxiety disorder, unspecified: Secondary | ICD-10-CM | POA: Diagnosis present

## 2015-11-28 DIAGNOSIS — I10 Essential (primary) hypertension: Secondary | ICD-10-CM | POA: Diagnosis present

## 2015-11-28 DIAGNOSIS — Z951 Presence of aortocoronary bypass graft: Secondary | ICD-10-CM

## 2015-11-28 DIAGNOSIS — M549 Dorsalgia, unspecified: Secondary | ICD-10-CM | POA: Diagnosis present

## 2015-11-28 DIAGNOSIS — Z66 Do not resuscitate: Secondary | ICD-10-CM | POA: Diagnosis present

## 2015-11-28 DIAGNOSIS — K219 Gastro-esophageal reflux disease without esophagitis: Secondary | ICD-10-CM | POA: Diagnosis present

## 2015-11-28 DIAGNOSIS — Z91040 Latex allergy status: Secondary | ICD-10-CM

## 2015-11-28 DIAGNOSIS — I4819 Other persistent atrial fibrillation: Secondary | ICD-10-CM | POA: Diagnosis present

## 2015-11-28 DIAGNOSIS — N186 End stage renal disease: Secondary | ICD-10-CM | POA: Diagnosis not present

## 2015-11-28 DIAGNOSIS — Z87891 Personal history of nicotine dependence: Secondary | ICD-10-CM

## 2015-11-28 DIAGNOSIS — Z888 Allergy status to other drugs, medicaments and biological substances status: Secondary | ICD-10-CM

## 2015-11-28 DIAGNOSIS — Z7901 Long term (current) use of anticoagulants: Secondary | ICD-10-CM

## 2015-11-28 DIAGNOSIS — J189 Pneumonia, unspecified organism: Secondary | ICD-10-CM | POA: Diagnosis present

## 2015-11-28 DIAGNOSIS — E785 Hyperlipidemia, unspecified: Secondary | ICD-10-CM | POA: Diagnosis present

## 2015-11-28 NOTE — ED Notes (Signed)
Pt brought to ED by [facility] from home for SOB, persistent cough, pt c/o soreness on her chest, Pt is HD pt with schedule T, Th, Saturday changed this week for vacation, last HD Friday 11/27/15. CBG 104 for EMS BP 134/58, SR with 1 AVB on monitor.

## 2015-11-29 ENCOUNTER — Encounter (HOSPITAL_COMMUNITY): Payer: Self-pay | Admitting: Family Medicine

## 2015-11-29 ENCOUNTER — Emergency Department (HOSPITAL_COMMUNITY): Payer: Medicare Other

## 2015-11-29 DIAGNOSIS — Z7901 Long term (current) use of anticoagulants: Secondary | ICD-10-CM | POA: Diagnosis not present

## 2015-11-29 DIAGNOSIS — I420 Dilated cardiomyopathy: Secondary | ICD-10-CM | POA: Diagnosis present

## 2015-11-29 DIAGNOSIS — E785 Hyperlipidemia, unspecified: Secondary | ICD-10-CM | POA: Diagnosis present

## 2015-11-29 DIAGNOSIS — Z888 Allergy status to other drugs, medicaments and biological substances status: Secondary | ICD-10-CM | POA: Diagnosis not present

## 2015-11-29 DIAGNOSIS — I2510 Atherosclerotic heart disease of native coronary artery without angina pectoris: Secondary | ICD-10-CM | POA: Diagnosis not present

## 2015-11-29 DIAGNOSIS — I252 Old myocardial infarction: Secondary | ICD-10-CM | POA: Diagnosis not present

## 2015-11-29 DIAGNOSIS — Y95 Nosocomial condition: Secondary | ICD-10-CM | POA: Diagnosis present

## 2015-11-29 DIAGNOSIS — Z79899 Other long term (current) drug therapy: Secondary | ICD-10-CM | POA: Diagnosis not present

## 2015-11-29 DIAGNOSIS — Z951 Presence of aortocoronary bypass graft: Secondary | ICD-10-CM | POA: Diagnosis not present

## 2015-11-29 DIAGNOSIS — I739 Peripheral vascular disease, unspecified: Secondary | ICD-10-CM | POA: Diagnosis present

## 2015-11-29 DIAGNOSIS — F419 Anxiety disorder, unspecified: Secondary | ICD-10-CM | POA: Diagnosis present

## 2015-11-29 DIAGNOSIS — J189 Pneumonia, unspecified organism: Secondary | ICD-10-CM | POA: Diagnosis present

## 2015-11-29 DIAGNOSIS — D631 Anemia in chronic kidney disease: Secondary | ICD-10-CM | POA: Diagnosis present

## 2015-11-29 DIAGNOSIS — I5022 Chronic systolic (congestive) heart failure: Secondary | ICD-10-CM | POA: Diagnosis present

## 2015-11-29 DIAGNOSIS — J440 Chronic obstructive pulmonary disease with acute lower respiratory infection: Secondary | ICD-10-CM | POA: Diagnosis present

## 2015-11-29 DIAGNOSIS — I481 Persistent atrial fibrillation: Secondary | ICD-10-CM | POA: Diagnosis present

## 2015-11-29 DIAGNOSIS — A419 Sepsis, unspecified organism: Secondary | ICD-10-CM | POA: Diagnosis present

## 2015-11-29 DIAGNOSIS — Z66 Do not resuscitate: Secondary | ICD-10-CM | POA: Diagnosis present

## 2015-11-29 DIAGNOSIS — K219 Gastro-esophageal reflux disease without esophagitis: Secondary | ICD-10-CM | POA: Diagnosis present

## 2015-11-29 DIAGNOSIS — N2581 Secondary hyperparathyroidism of renal origin: Secondary | ICD-10-CM | POA: Diagnosis present

## 2015-11-29 DIAGNOSIS — Z8249 Family history of ischemic heart disease and other diseases of the circulatory system: Secondary | ICD-10-CM | POA: Diagnosis not present

## 2015-11-29 DIAGNOSIS — J154 Pneumonia due to other streptococci: Secondary | ICD-10-CM | POA: Diagnosis present

## 2015-11-29 DIAGNOSIS — A409 Streptococcal sepsis, unspecified: Secondary | ICD-10-CM | POA: Diagnosis not present

## 2015-11-29 DIAGNOSIS — M549 Dorsalgia, unspecified: Secondary | ICD-10-CM | POA: Diagnosis present

## 2015-11-29 DIAGNOSIS — I132 Hypertensive heart and chronic kidney disease with heart failure and with stage 5 chronic kidney disease, or end stage renal disease: Secondary | ICD-10-CM | POA: Diagnosis present

## 2015-11-29 DIAGNOSIS — Z992 Dependence on renal dialysis: Secondary | ICD-10-CM | POA: Diagnosis not present

## 2015-11-29 DIAGNOSIS — Z91040 Latex allergy status: Secondary | ICD-10-CM | POA: Diagnosis not present

## 2015-11-29 DIAGNOSIS — Z87891 Personal history of nicotine dependence: Secondary | ICD-10-CM | POA: Diagnosis not present

## 2015-11-29 DIAGNOSIS — M109 Gout, unspecified: Secondary | ICD-10-CM | POA: Diagnosis present

## 2015-11-29 DIAGNOSIS — N186 End stage renal disease: Secondary | ICD-10-CM | POA: Diagnosis present

## 2015-11-29 DIAGNOSIS — I10 Essential (primary) hypertension: Secondary | ICD-10-CM | POA: Diagnosis not present

## 2015-11-29 LAB — BLOOD CULTURE ID PANEL (REFLEXED)
ACINETOBACTER BAUMANNII: NOT DETECTED
CANDIDA ALBICANS: NOT DETECTED
CANDIDA GLABRATA: NOT DETECTED
CANDIDA TROPICALIS: NOT DETECTED
Candida krusei: NOT DETECTED
Candida parapsilosis: NOT DETECTED
Carbapenem resistance: NOT DETECTED
ENTEROBACTER CLOACAE COMPLEX: NOT DETECTED
ENTEROBACTERIACEAE SPECIES: NOT DETECTED
Enterococcus species: NOT DETECTED
Escherichia coli: NOT DETECTED
HAEMOPHILUS INFLUENZAE: NOT DETECTED
KLEBSIELLA OXYTOCA: NOT DETECTED
KLEBSIELLA PNEUMONIAE: NOT DETECTED
Listeria monocytogenes: NOT DETECTED
METHICILLIN RESISTANCE: NOT DETECTED
Neisseria meningitidis: NOT DETECTED
Proteus species: NOT DETECTED
Pseudomonas aeruginosa: NOT DETECTED
STREPTOCOCCUS AGALACTIAE: NOT DETECTED
STREPTOCOCCUS PNEUMONIAE: DETECTED — AB
STREPTOCOCCUS SPECIES: DETECTED — AB
Serratia marcescens: NOT DETECTED
Staphylococcus aureus (BCID): NOT DETECTED
Staphylococcus species: NOT DETECTED
Streptococcus pyogenes: NOT DETECTED
Vancomycin resistance: NOT DETECTED

## 2015-11-29 LAB — CBC
HCT: 32.9 % — ABNORMAL LOW (ref 36.0–46.0)
HEMATOCRIT: 38.5 % (ref 36.0–46.0)
HEMOGLOBIN: 12.4 g/dL (ref 12.0–15.0)
Hemoglobin: 10.2 g/dL — ABNORMAL LOW (ref 12.0–15.0)
MCH: 32.3 pg (ref 26.0–34.0)
MCH: 33.2 pg (ref 26.0–34.0)
MCHC: 31 g/dL (ref 30.0–36.0)
MCHC: 32.2 g/dL (ref 30.0–36.0)
MCV: 102.9 fL — ABNORMAL HIGH (ref 78.0–100.0)
MCV: 104.1 fL — AB (ref 78.0–100.0)
PLATELETS: 229 10*3/uL (ref 150–400)
Platelets: 269 10*3/uL (ref 150–400)
RBC: 3.16 MIL/uL — ABNORMAL LOW (ref 3.87–5.11)
RBC: 3.74 MIL/uL — ABNORMAL LOW (ref 3.87–5.11)
RDW: 13.8 % (ref 11.5–15.5)
RDW: 13.8 % (ref 11.5–15.5)
WBC: 19.9 10*3/uL — ABNORMAL HIGH (ref 4.0–10.5)
WBC: 22 10*3/uL — AB (ref 4.0–10.5)

## 2015-11-29 LAB — BASIC METABOLIC PANEL
ANION GAP: 12 (ref 5–15)
Anion gap: 11 (ref 5–15)
BUN: 25 mg/dL — ABNORMAL HIGH (ref 6–20)
BUN: 29 mg/dL — AB (ref 6–20)
CHLORIDE: 100 mmol/L — AB (ref 101–111)
CO2: 23 mmol/L (ref 22–32)
CO2: 25 mmol/L (ref 22–32)
CREATININE: 3.76 mg/dL — AB (ref 0.44–1.00)
Calcium: 8.3 mg/dL — ABNORMAL LOW (ref 8.9–10.3)
Calcium: 9 mg/dL (ref 8.9–10.3)
Chloride: 98 mmol/L — ABNORMAL LOW (ref 101–111)
Creatinine, Ser: 3.64 mg/dL — ABNORMAL HIGH (ref 0.44–1.00)
GFR calc Af Amer: 12 mL/min — ABNORMAL LOW (ref >60)
GFR calc non Af Amer: 11 mL/min — ABNORMAL LOW (ref >60)
GFR, EST AFRICAN AMERICAN: 13 mL/min — AB (ref >60)
GFR, EST NON AFRICAN AMERICAN: 11 mL/min — AB (ref >60)
GLUCOSE: 79 mg/dL (ref 65–99)
Glucose, Bld: 87 mg/dL (ref 65–99)
Potassium: 3.5 mmol/L (ref 3.5–5.1)
Potassium: 3.6 mmol/L (ref 3.5–5.1)
SODIUM: 134 mmol/L — AB (ref 135–145)
SODIUM: 135 mmol/L (ref 135–145)

## 2015-11-29 LAB — TROPONIN I: Troponin I: 0.08 ng/mL (ref <0.03)

## 2015-11-29 LAB — PROCALCITONIN: PROCALCITONIN: 7.76 ng/mL

## 2015-11-29 LAB — URINALYSIS, ROUTINE W REFLEX MICROSCOPIC
Glucose, UA: NEGATIVE mg/dL
Hgb urine dipstick: NEGATIVE
KETONES UR: 15 mg/dL — AB
NITRITE: NEGATIVE
PH: 5 (ref 5.0–8.0)
Protein, ur: 100 mg/dL — AB
SPECIFIC GRAVITY, URINE: 1.027 (ref 1.005–1.030)

## 2015-11-29 LAB — URINE MICROSCOPIC-ADD ON

## 2015-11-29 LAB — I-STAT TROPONIN, ED: Troponin i, poc: 0.08 ng/mL (ref 0.00–0.08)

## 2015-11-29 LAB — PROTIME-INR
INR: 2.14 — ABNORMAL HIGH (ref 0.00–1.49)
Prothrombin Time: 23.8 seconds — ABNORMAL HIGH (ref 11.6–15.2)

## 2015-11-29 LAB — STREP PNEUMONIAE URINARY ANTIGEN: Strep Pneumo Urinary Antigen: POSITIVE — AB

## 2015-11-29 LAB — I-STAT CG4 LACTIC ACID, ED
LACTIC ACID, VENOUS: 2.81 mmol/L — AB (ref 0.5–1.9)
Lactic Acid, Venous: 1.81 mmol/L (ref 0.5–1.9)

## 2015-11-29 MED ORDER — RENA-VITE PO TABS
1.0000 | ORAL_TABLET | Freq: Every day | ORAL | Status: DC
  Administered 2015-11-29 – 2015-12-03 (×5): 1 via ORAL
  Filled 2015-11-29 (×5): qty 1

## 2015-11-29 MED ORDER — AMIODARONE HCL 200 MG PO TABS
200.0000 mg | ORAL_TABLET | Freq: Every day | ORAL | Status: DC
  Administered 2015-11-29 – 2015-12-03 (×5): 200 mg via ORAL
  Filled 2015-11-29 (×5): qty 1

## 2015-11-29 MED ORDER — VANCOMYCIN HCL IN DEXTROSE 1-5 GM/200ML-% IV SOLN
1000.0000 mg | Freq: Once | INTRAVENOUS | Status: AC
Start: 2015-11-29 — End: 2015-11-29
  Administered 2015-11-29: 1000 mg via INTRAVENOUS
  Filled 2015-11-29: qty 200

## 2015-11-29 MED ORDER — ACETAMINOPHEN 325 MG PO TABS
650.0000 mg | ORAL_TABLET | Freq: Four times a day (QID) | ORAL | Status: DC | PRN
  Administered 2015-11-29 – 2015-12-02 (×4): 650 mg via ORAL
  Filled 2015-11-29 (×4): qty 2

## 2015-11-29 MED ORDER — ONDANSETRON HCL 4 MG/2ML IJ SOLN: 4.0000 mg | Freq: Four times a day (QID) | INTRAMUSCULAR | Status: DC | PRN

## 2015-11-29 MED ORDER — LORAZEPAM 0.5 MG PO TABS
0.5000 mg | ORAL_TABLET | Freq: Four times a day (QID) | ORAL | Status: DC | PRN
  Administered 2015-11-29 (×3): 0.5 mg via ORAL
  Filled 2015-11-29 (×3): qty 1

## 2015-11-29 MED ORDER — HYDRALAZINE HCL 25 MG PO TABS
25.0000 mg | ORAL_TABLET | Freq: Two times a day (BID) | ORAL | Status: DC
  Administered 2015-11-30 – 2015-12-03 (×5): 25 mg via ORAL
  Filled 2015-11-29 (×7): qty 1

## 2015-11-29 MED ORDER — IPRATROPIUM-ALBUTEROL 0.5-2.5 (3) MG/3ML IN SOLN
3.0000 mL | Freq: Once | RESPIRATORY_TRACT | Status: AC
Start: 2015-11-29 — End: 2015-11-29
  Administered 2015-11-29: 3 mL via RESPIRATORY_TRACT
  Filled 2015-11-29: qty 3

## 2015-11-29 MED ORDER — WARFARIN SODIUM 7.5 MG PO TABS
3.7500 mg | ORAL_TABLET | ORAL | Status: DC
  Administered 2015-11-29: 3.75 mg via ORAL
  Filled 2015-11-29: qty 0.5

## 2015-11-29 MED ORDER — VANCOMYCIN HCL 500 MG IV SOLR
500.0000 mg | Freq: Once | INTRAVENOUS | Status: AC
  Administered 2015-11-29: 500 mg via INTRAVENOUS
  Filled 2015-11-29: qty 500

## 2015-11-29 MED ORDER — NEPRO/CARBSTEADY PO LIQD
237.0000 mL | Freq: Two times a day (BID) | ORAL | Status: DC
  Filled 2015-11-29 (×12): qty 237

## 2015-11-29 MED ORDER — ACETAMINOPHEN 650 MG RE SUPP: 650.0000 mg | Freq: Four times a day (QID) | RECTAL | Status: DC | PRN

## 2015-11-29 MED ORDER — ONDANSETRON HCL 4 MG PO TABS: 4.0000 mg | ORAL_TABLET | Freq: Four times a day (QID) | ORAL | Status: DC | PRN

## 2015-11-29 MED ORDER — HEPARIN SODIUM (PORCINE) 5000 UNIT/ML IJ SOLN: 5000.0000 [IU] | Freq: Three times a day (TID) | INTRAMUSCULAR | Status: DC

## 2015-11-29 MED ORDER — FEBUXOSTAT 40 MG PO TABS
40.0000 mg | ORAL_TABLET | Freq: Every day | ORAL | Status: DC
  Administered 2015-11-29 – 2015-12-03 (×5): 40 mg via ORAL
  Filled 2015-11-29 (×5): qty 1

## 2015-11-29 MED ORDER — ALBUTEROL SULFATE (2.5 MG/3ML) 0.083% IN NEBU: 2.5000 mg | INHALATION_SOLUTION | RESPIRATORY_TRACT | Status: DC | PRN

## 2015-11-29 MED ORDER — ACETAMINOPHEN 500 MG PO TABS
1000.0000 mg | ORAL_TABLET | Freq: Once | ORAL | Status: AC
  Administered 2015-11-29: 1000 mg via ORAL
  Filled 2015-11-29: qty 2

## 2015-11-29 MED ORDER — SODIUM CHLORIDE 0.9 % IV SOLN
1000.0000 mL | Freq: Once | INTRAVENOUS | Status: AC
  Administered 2015-11-29: 1000 mL via INTRAVENOUS

## 2015-11-29 MED ORDER — CEFEPIME HCL 1 G IJ SOLR
1.0000 g | INTRAMUSCULAR | Status: DC
  Administered 2015-11-29: 1 g via INTRAVENOUS
  Filled 2015-11-29 (×2): qty 1

## 2015-11-29 MED ORDER — SODIUM CHLORIDE 0.9% FLUSH
3.0000 mL | Freq: Two times a day (BID) | INTRAVENOUS | Status: DC
  Administered 2015-11-29 – 2015-12-03 (×6): 3 mL via INTRAVENOUS

## 2015-11-29 MED ORDER — WARFARIN - PHARMACIST DOSING INPATIENT
Freq: Every day | Status: DC
  Administered 2015-12-02: 18:00:00

## 2015-11-29 MED ORDER — WARFARIN SODIUM 2.5 MG PO TABS: 2.5000 mg | ORAL_TABLET | ORAL | Status: DC

## 2015-11-29 MED ORDER — DEXTROSE 5 % IV SOLN
2.0000 g | Freq: Once | INTRAVENOUS | Status: AC
  Administered 2015-11-29: 2 g via INTRAVENOUS
  Filled 2015-11-29: qty 2

## 2015-11-29 MED ORDER — VANCOMYCIN HCL 500 MG IV SOLR: 500.0000 mg | INTRAVENOUS | Status: DC

## 2015-11-29 NOTE — ED Provider Notes (Signed)
CSN: [id number]     Arrival date & time 11/28/15  2353 History   By signing my name below, I, Suzan Slick. Elon Spanner, attest that this documentation has been prepared under the direction and in the presence of Arby Barrette, MD.  Electronically Signed: Suzan Slick. Elon Spanner, ED Scribe. 11/29/2015. 12:45 AM.   Chief Complaint  Patient presents with  . Chest Pain  . Cough  . Shortness of Breath   The history is provided by the patient. No language interpreter was used.    HPI Comments: MINDY GALI is a 80 y.o. female with a PMHx of CAD, PAD, CKD, hyperlipidemia, CHF, A-Fib, MI, and COPD who presents to the Emergency Department complaining of intermittent, unchanged cough onset 4:00 PM this afternoon. She also reports chest soreness she attributes to coughing, fever of 101.3, and shortness of breath. No aggravating or alleviating factors at this time. No OTC/prescribed medications attempted at home. No recent chills, nausea, or vomiting. Pt typically dialyzes on Tuesdays, Thursdays, and Saturdays. However, pt changed her schedule this week to go on vacation. Last full dialysis treatment on Friday 11/27/15 with next treatment scheduled for Monday 11/30/15. Last PNA diagnosis in February of this year.  PCP: Michiel Sites, MD    Past Medical History  Diagnosis Date  . CAD in native artery 2007; 08/2015:     a. S/p CABG 2007 (LIMA-LAD, SVG-dRCA, SVG-intermediate, SVG-diagonal). b. Nuclear stress test 08/2012: normal, low risk.; c. RI 100%, o-mRCA 100%, mLAD 60%, dLAD 70%, oD1 99%. LIMA-LAD patent, SVG-PRAV patent, SVG-RI patent, SVG-D1 ~50% dSVG. EDP 26 mHg  . S/P CABG x 4 2007;     LIMA-LAD, SVG-Distal RCA, SVG-Ramus, SVG-Diagonal; no echocardiogram done  . PAD (peripheral artery disease) (HCC) 1997    Mild-moderate carotid disease; status post right SFA occlusion with Fem-Below Knee Pop bypass (Dr. Hart Rochester); LEA Dopplers 07/2014: RIGHT - ABI 0.84, CIA/EIA 50-69%, CFA/PFA patent Fem-Pop bypass patent  w/ 70-99% anastomotic stenosis, patent Pop A with 3 V runoff; LABI 0.85- L CIA/EIA patent, LPFA 70-99%, dLSFA 50-69%, patent LPopA & 3 V runoff.  . Renovascular hypertension 12/17/2013    a. s/p L RA Stent 11/2013. b. Renal duplex 02/2015: >60% right proximal renal artery stenosis, normal left renal artery s/p stent, f/u 1 yr recommended.  . CKD (chronic kidney disease), stage III   . Essential hypertension   . Hyperlipidemia LDL goal <70   . Carotid artery disease (HCC)     a. Mild-mod carotid disease per Dr.Berry's note.  . Anemia   . Pneumonia   . CHF (congestive heart failure) (HCC)     Combined systolic and diastolic  . A-fib (HCC)     On amiodarone, warfarin  . Myocardial infarction (HCC)     In setting of pneumonia and A. fib with RVR  . GERD (gastroesophageal reflux disease)   . End-stage renal disease on hemodialysis (HCC) 07/19/2015  . Congestive dilated cardiomyopathy [facility]) February 2017-April 2017    Combination of ischemic and atrial fibrillation related -> EF improved from 20-25% up to 40-45% by recent echo.  . Abnormal lung function test     Minimal restriction-interstitial, moderate-severe diffusion defect. Reduced lung volumes with increased FEV1/FVC ratio and diffusion defect suggest an interstitial process such as fibrosis or interstitial inflammation.   Past Surgical History  Procedure Laterality Date  . Cardiac catheterization  December 2007    LAD-90% mid, D19 percent ostial. Circumflex-OM1 90% ostial, 80% mid. RCA 9% proximal, 100% mid  . Femoropopliteal  bypass Right 1997    Dr. Hart Rochester: SFA-below knee Pop  . Coronary artery bypass graft  December 2007    LIMA-LAD, SVG-distal RCA, SVG-D1, SVG-OM1/Ramus  . Nm myoview ltd  April 2014    EF 63%, mild fixed anteroapical defect thought to be breast attenuation  . Appendectomy    . Tonsillectomy    . Cataract surgery    . Rectovaginal fistula repair  1997  . [place] [facility]  1981  . Renal artery stent Left 12/16/13    using  CO2  . Lower extremity venous doppler  06/13/2011    No evidence of thrombus or thrombophlebitis, no venous insuffiency noted.  . Cardiovascular stress test  08/28/2012    Normal stress nuclear study with likely breast attenuation, low risk stress test.  . Renal angiogram N/A 12/16/2013    Procedure: RENAL ANGIOGRAM;  Surgeon: Runell Gess, MD;  Location: [facility] CATH LAB;  Service: Cardiovascular;  Laterality: N/A;  . Percutaneous stent intervention Left 12/16/2013    Procedure: PERCUTANEOUS STENT INTERVENTION - LEFT RENAL ARTERY;  Surgeon: Runell Gess, MD;  Location: [facility] CATH LAB;  Service: Cardiovascular;  Laterality: Left;  renal  . Cardiac catheterization N/A 07/09/2015    Procedure: Right Heart Cath;  Surgeon: Dolores Patty, MD;  Location: [facility] INVASIVE CV LAB;  Service: Cardiovascular;  Laterality: N/A;  . Cardiac catheterization N/A 07/13/2015    Procedure: Left Heart Cath and Cors/Grafts Angiography;  Surgeon: Marykay Lex, MD;  Location: [facility] INVASIVE CV LAB;  Service: Cardiovascular:  RI 100%, o-mRCA 100%, mLAD 60%, dLAD 70%, oD1 99%. LIMA-LAD patent, SVG-PRAV patent, SVG-RI patent, SVG-D1 ~50% dSVG. EDP 26 mHg  . Peripheral vascular catheterization N/A 07/13/2015    Procedure: Renal Angiography;  Surgeon: Marykay Lex, MD;  Location: [facility] INVASIVE CV LAB;  Service: Cardiovascular: Bilateral renal arteries are patent  . Back surgery    . Colonoscopy    . Abdominal hysterectomy    . Av fistula placement Left 09/02/2015    Procedure: RADIAL - CEPHALIC ARTERIOVENOUS (AV) FISTULA CREATION LEFT ARM;  Surgeon: Pryor Ochoa, MD;  Location: [facility] OR;  Service: Vascular;  Laterality: Left;  . Transthoracic echocardiogram  08/26/2015    EF 45-50% mild concentric Vertebrae. Anteroseptal hypokinesis. Pseudo-normal/grade 2 diastolic dysfunction. Moderate AI. Mild MR. Severe LA dilation. Mild RV dilation. Mild TR. Significant improvement in LVEF.   Family History  Problem Relation Age of Onset  .  Breast cancer Mother   . Heart attack Father   . Cancer - Cervical Brother    Social History  Substance Use Topics  . Smoking status: Former Smoker    Types: Cigarettes    Quit date: 05/31/1993  . Smokeless tobacco: Never Used  . Alcohol Use: 4.2 oz/week    7 Glasses of wine per week     Comment: 4 shots of whiskey per night; denies,  last use 2/10/ 17   OB History    No data available     Review of Systems  A complete 10 system review of systems was obtained and all systems are negative except as noted in the HPI and PMH.    Allergies  Latex; Statins; Angiotensin receptor blockers; and Benicar  Home Medications   Prior to Admission medications   Medication Sig Start Date End Date Taking? Authorizing Provider  acetaminophen (TYLENOL) 325 MG tablet Take 650 mg by mouth every 6 (six) hours as needed for moderate pain.    Historical Provider, MD  amiodarone (PACERONE) 200 MG tablet Take 1 tablet (200 mg total) by mouth daily. 400 mg daily for 1 week then decrease to 200 mg daily Patient taking differently: Take 200 mg by mouth daily.  08/14/15   Beatrice Lecher, PA-C  betamethasone dipropionate (DIPROLENE) 0.05 % cream Apply 1 application topically 2 (two) times daily as needed. ECZEMA    Historical Provider, MD  colchicine 0.6 MG tablet Take 0.6 mg by mouth 2 (two) times daily as needed. GOUT FLARE UP.    Historical Provider, MD  febuxostat (ULORIC) 40 MG tablet Take 40 mg by mouth daily.    Historical Provider, MD  hydrALAZINE (APRESOLINE) 25 MG tablet Take 1 tablet by mouth twice daily. You can take an extra 25mg  in the morning if your systolic BP is greater than 160. 11/13/15   Marykay Lex, MD  LORazepam (ATIVAN) 0.5 MG tablet Take 0.5 mg by mouth 4 (four) times daily as needed for anxiety.    Historical Provider, MD  Multiple Vitamins-Minerals (ICAPS) CAPS Take 1 capsule by mouth 2 (two) times daily.    Historical Provider, MD  multivitamin (RENA-VIT) TABS tablet Take 1  tablet by mouth daily.    Historical Provider, MD  Nutritional Supplements (FEEDING SUPPLEMENT, NEPRO CARB STEADY,) LIQD Take 237 mLs by mouth 2 (two) times daily between meals. 07/19/15   Nishant Dhungel, MD  omeprazole (PRILOSEC) 20 MG capsule Take 20 mg by mouth daily.    Historical Provider, MD  warfarin (COUMADIN) 2.5 MG tablet Take 1 tablet (2.5 mg total) by mouth daily. Take 1 tablet by mouth daily or as directed by coumadin clinic 09/02/15   Ames Coupe Rhyne, PA-C  warfarin (COUMADIN) 2.5 MG tablet Take 1-1.5 tablets by mouth daily as directed by coumadin clinic 10/27/15   Marykay Lex, MD   Triage Vitals: BP 126/60 mmHg  Pulse 88  Temp(Src) 101.2 F (38.4 C) (Oral)  Resp 29  Ht 5\' 3"  (1.6 m)  Wt 150 lb (68.04 kg)  BMI 26.58 kg/m2  SpO2 98%  Physical Exam  Constitutional: She is oriented to person, place, and time. She appears well-developed and well-nourished.  HENT:  Head: Normocephalic and atraumatic.  Eyes: EOM are normal.  Neck: Normal range of motion.  Cardiovascular: Normal rate and regular rhythm.  Exam reveals no gallop and no friction rub.   Murmur heard. 1-2/6 systolic ejection murmur.  Pulmonary/Chest: Effort normal.  Crackles noted at the L base.  Abdominal: Soft. She exhibits no distension. There is no tenderness. There is no rebound and no guarding.  Musculoskeletal: Normal range of motion. She exhibits no edema or tenderness.  No lower extremity edema. Calfs are soft and non-tender.  Neurological: She is alert and oriented to person, place, and time.  Skin: Skin is warm and dry.  Psychiatric: She has a normal mood and affect. Judgment normal.  Nursing note and vitals reviewed.   ED Course  Procedures (including critical care time) CRITICAL CARE Performed by: Arby Barrette   Total critical care time: 30  minutes  Critical care time was exclusive of separately billable procedures and treating other patients.  Critical care was necessary to treat or  prevent imminent or life-threatening deterioration.  Critical care was time spent personally by me on the following activities: development of treatment plan with patient and/or surrogate as well as nursing, discussions with consultants, evaluation of patient's response to treatment, examination of patient, obtaining history from patient or surrogate, ordering and performing treatments and interventions,  ordering and review of laboratory studies, ordering and review of radiographic studies, pulse oximetry and re-evaluation of patient's condition.  DIAGNOSTIC STUDIES: Oxygen Saturation is 98% on RA, 2 liters by my interpretation.    COORDINATION OF CARE: 12:40 AM- Will order CXR, blood work, and EKG. Discussed treatment plan with pt at bedside and pt agreed to plan.     Labs Review Labs Reviewed  BASIC METABOLIC PANEL - Abnormal; Notable for the following:    Chloride 98 (*)    BUN 25 (*)    Creatinine, Ser 3.64 (*)    GFR calc non Af Amer 11 (*)    GFR calc Af Amer 13 (*)    All other components within normal limits  CBC - Abnormal; Notable for the following:    WBC 22.0 (*)    RBC 3.74 (*)    MCV 102.9 (*)    All other components within normal limits  I-STAT CG4 LACTIC ACID, ED - Abnormal; Notable for the following:    Lactic Acid, Venous 2.81 (*)    All other components within normal limits  CULTURE, BLOOD (ROUTINE X 2)  CULTURE, BLOOD (ROUTINE X 2)  URINE CULTURE  URINALYSIS, ROUTINE W REFLEX MICROSCOPIC (NOT AT [facility])  I-STAT TROPOININ, ED  I-STAT CG4 LACTIC ACID, ED  I-STAT CG4 LACTIC ACID, ED    Imaging Review Dg Chest 2 View  11/29/2015  CLINICAL DATA:  Shortness of breath, cough today. EXAM: CHEST  2 VIEW COMPARISON:  Chest radiograph 07/12/2015, chest CT 07/08/2015 FINDINGS: Left-sided dialysis catheter, tip at the atrial caval junction. Patient is post median sternotomy. Cardiomegaly is stable. Rounded left perihilar and right suprahilar opacities, new from prior exam.  There are small pleural effusions. No pneumothorax. IMPRESSION: Bilateral perihilar opacities, differential considerations of multifocal pneumonia versus asymmetric pulmonary edema. Cardiomegaly and small pleural effusions, chronic. Electronically Signed   By: Rubye Oaks M.D.   On: 11/29/2015 01:02   I have personally reviewed and evaluated these images and lab results as part of my medical decision-making.   EKG Interpretation   Date/Time:  Saturday November 28 2015 23:58:39 EDT Ventricular Rate:  88 PR Interval:    QRS Duration: 109 QT Interval:  386 QTC Calculation: 467 R Axis:   -45 Text Interpretation:  Sinus or ectopic atrial rhythm Left anterior  fascicular block Left ventricular hypertrophy NO SIG CHANGE FROM PREVIOUS  Confirmed by Donnald Garre, MD, Lebron Conners [phone]) on 11/29/2015 12:20:25 AM     Consult: Hospitalist for admission MDM   Final diagnoses:  Healthcare-associated pneumonia  ESRD (end stage renal disease) (HCC)   Patient presents with fairly acute onset of cough with fever. Chest x-ray confirms bilateral infiltrates. Patient has comorbid illness of ESRD. She gets dialysis with last dialysis being yesterday. Patient has fever, elevated lactic respiratory rate with criteria for sepsis. Blood pressures are stable and heart rate is in the 80s. Management was discussed with Dr. Valerie Roys and at this time we will administer 1 L of IV fluids, he will recheck lactic and continue to monitor vital signs closely.  Arby Barrette, MD 11/29/15 [phone]

## 2015-11-29 NOTE — Progress Notes (Signed)
ANTICOAGULATION CONSULT NOTE - Initial Consult  Pharmacy Consult for Coumadin Indication: atrial fibrillation  Allergies  Allergen Reactions  . Latex Swelling  . Statins Other (See Comments)    CLASS REACTION:  DIFFUSE CRAMPING Includes Lipitor, Crestor, Pravachol and simvastatin  . Angiotensin Receptor Blockers Itching and Rash  . Benicar [Olmesartan] Itching and Rash    Patient Measurements: Height: 5\' 3"  (160 cm) Weight: 155 lb 9.6 oz (70.58 kg) IBW/kg (Calculated) : 52.4  Vital Signs: Temp: 98.8 F (37.1 C) (07/09 0329) Temp Source: Oral (07/09 0329) BP: 113/48 mmHg (07/09 0329) Pulse Rate: 81 (07/09 0329)  Labs:  Recent Labs  11/29/15 0013  HGB 12.4  HCT 38.5  PLT 269  CREATININE 3.64*    Estimated Creatinine Clearance: 12 mL/min (by C-G formula based on Cr of 3.64).   Medical History: Past Medical History  Diagnosis Date  . CAD in native artery 2007; 08/2015:     a. S/p CABG 2007 (LIMA-LAD, SVG-dRCA, SVG-intermediate, SVG-diagonal). b. Nuclear stress test 08/2012: normal, low risk.; c. RI 100%, o-mRCA 100%, mLAD 60%, dLAD 70%, oD1 99%. LIMA-LAD patent, SVG-PRAV patent, SVG-RI patent, SVG-D1 ~50% dSVG. EDP 26 mHg  . S/P CABG x 4 2007;     LIMA-LAD, SVG-Distal RCA, SVG-Ramus, SVG-Diagonal; no echocardiogram done  . PAD (peripheral artery disease) (HCC) 1997    Mild-moderate carotid disease; status post right SFA occlusion with Fem-Below Knee Pop bypass (Dr. Hart Rochester); LEA Dopplers 07/2014: RIGHT - ABI 0.84, CIA/EIA 50-69%, CFA/PFA patent Fem-Pop bypass patent w/ 70-99% anastomotic stenosis, patent Pop A with 3 V runoff; LABI 0.85- L CIA/EIA patent, LPFA 70-99%, dLSFA 50-69%, patent LPopA & 3 V runoff.  . Renovascular hypertension 12/17/2013    a. s/p L RA Stent 11/2013. b. Renal duplex 02/2015: >60% right proximal renal artery stenosis, normal left renal artery s/p stent, f/u 1 yr recommended.  . CKD (chronic kidney disease), stage III   . Essential hypertension    . Hyperlipidemia LDL goal <70   . Carotid artery disease (HCC)     a. Mild-mod carotid disease per Dr.Berry's note.  . Anemia   . Pneumonia   . CHF (congestive heart failure) (HCC)     Combined systolic and diastolic  . A-fib (HCC)     On amiodarone, warfarin  . Myocardial infarction (HCC)     In setting of pneumonia and A. fib with RVR  . GERD (gastroesophageal reflux disease)   . End-stage renal disease on hemodialysis (HCC) 07/19/2015  . Congestive dilated cardiomyopathy [facility]) February 2017-April 2017    Combination of ischemic and atrial fibrillation related -> EF improved from 20-25% up to 40-45% by recent echo.  . Abnormal lung function test     Minimal restriction-interstitial, moderate-severe diffusion defect. Reduced lung volumes with increased FEV1/FVC ratio and diffusion defect suggest an interstitial process such as fibrosis or interstitial inflammation.    Medications:  Prescriptions prior to admission  Medication Sig Dispense Refill Last Dose  . acetaminophen (TYLENOL) 325 MG tablet Take 650 mg by mouth every 6 (six) hours as needed for moderate pain.   Taking  . amiodarone (PACERONE) 200 MG tablet Take 1 tablet (200 mg total) by mouth daily. 400 mg daily for 1 week then decrease to 200 mg daily (Patient taking differently: Take 200 mg by mouth daily. ) 100 tablet 3 Taking  . betamethasone dipropionate (DIPROLENE) 0.05 % cream Apply 1 application topically 2 (two) times daily as needed. ECZEMA   Taking  . colchicine 0.6  MG tablet Take 0.6 mg by mouth 2 (two) times daily as needed. GOUT FLARE UP.   Taking  . febuxostat (ULORIC) 40 MG tablet Take 40 mg by mouth daily.   Taking  . hydrALAZINE (APRESOLINE) 25 MG tablet Take 1 tablet by mouth twice daily. You can take an extra  in the morning if your systolic BP is greater than 160. 90 tablet 5   . LORazepam (ATIVAN) 0.5 MG tablet Take 0.5 mg by mouth 4 (four) times daily as needed for anxiety.   Taking  . Multiple  Vitamins-Minerals (ICAPS) CAPS Take 1 capsule by mouth 2 (two) times daily.   Taking  . multivitamin (RENA-VIT) TABS tablet Take 1 tablet by mouth daily.   Taking  . Nutritional Supplements (FEEDING SUPPLEMENT, NEPRO CARB STEADY,) LIQD Take 237 mLs by mouth 2 (two) times daily between meals. 60 Can 0 Taking  . omeprazole (PRILOSEC) 20 MG capsule Take 20 mg by mouth daily.   Taking  . warfarin (COUMADIN) 2.5 MG tablet Take 1 tablet (2.5 mg total) by mouth daily. Take 1 tablet by mouth daily or as directed by coumadin clinic 30 tablet 1 Taking  . warfarin (COUMADIN) 2.5 MG tablet Take 1-1.5 tablets by mouth daily as directed by coumadin clinic 40 tablet 3 Taking    Assessment: 80 y.o. female admitted with sepsis/PNA, h/o Afib, to continue Coumadin  Goal of Therapy:  INR 2-3 Monitor platelets by anticoagulation protocol: Yes   Plan:  F/U daily INR  Jacqueleen Pulver, Gary Fleet 11/29/2015,3:43 AM

## 2015-11-29 NOTE — Progress Notes (Signed)
PHARMACY - PHYSICIAN COMMUNICATION CRITICAL VALUE ALERT - BLOOD CULTURE IDENTIFICATION (BCID)  Results for orders placed or performed during the hospital encounter of 11/28/15  Blood Culture ID Panel (Reflexed) (Collected: 11/29/2015 12:13 AM)  Result Value Ref Range   Enterococcus species NOT DETECTED NOT DETECTED   Vancomycin resistance NOT DETECTED NOT DETECTED   Listeria monocytogenes NOT DETECTED NOT DETECTED   Staphylococcus species NOT DETECTED NOT DETECTED   Staphylococcus aureus NOT DETECTED NOT DETECTED   Methicillin resistance NOT DETECTED NOT DETECTED   Streptococcus species DETECTED (A) NOT DETECTED   Streptococcus agalactiae NOT DETECTED NOT DETECTED   Streptococcus pneumoniae DETECTED (A) NOT DETECTED   Streptococcus pyogenes NOT DETECTED NOT DETECTED   Acinetobacter baumannii NOT DETECTED NOT DETECTED   Enterobacteriaceae species NOT DETECTED NOT DETECTED   Enterobacter cloacae complex NOT DETECTED NOT DETECTED   Escherichia coli NOT DETECTED NOT DETECTED   Klebsiella oxytoca NOT DETECTED NOT DETECTED   Klebsiella pneumoniae NOT DETECTED NOT DETECTED   Proteus species NOT DETECTED NOT DETECTED   Serratia marcescens NOT DETECTED NOT DETECTED   Carbapenem resistance NOT DETECTED NOT DETECTED   Haemophilus influenzae NOT DETECTED NOT DETECTED   Neisseria meningitidis NOT DETECTED NOT DETECTED   Pseudomonas aeruginosa NOT DETECTED NOT DETECTED   Candida albicans NOT DETECTED NOT DETECTED   Candida glabrata NOT DETECTED NOT DETECTED   Candida krusei NOT DETECTED NOT DETECTED   Candida parapsilosis NOT DETECTED NOT DETECTED   Candida tropicalis NOT DETECTED NOT DETECTED    Name of physician (or Provider) Contacted: Text page to Dr. Arthor Captain  Changes to prescribed antibiotics required: Currently on vancomycin and cefepime for HCAP, could consider narrowing to ceftriaxone 2g IV daily for strep pneumo bacteremia.   Fredrik Rigger 11/29/2015  3:29 PM

## 2015-11-29 NOTE — Progress Notes (Signed)
PROGRESS NOTE  [name]  UJW:[id number] DOB: 10-19-1935 DOA: 11/28/2015 PCP: Michiel Sites, MD Outpatient Specialists:  Subjective: Seen with her daughter at bedside, denies any complaints feels much better than yesterday.  Brief Narrative:  80 year old female with history of a ESRD and chronic systolic CHF came in with symptoms of pneumonia and mildly septic.  Assessment & Plan:   Principal Problem:   Sepsis (HCC) Active Problems:   CAD in native artery - s/p CABG 4   PAD (peripheral artery disease) (HCC)   Hypertension, essential   Persistent atrial fibrillation (HCC)   End stage renal disease (HCC)   HCAP (healthcare-associated pneumonia)   Patient seen earlier today by my colleague Dr. Maryfrances Bunnell, this is a no charge note. Admitted with HCAP, started on cefepime and vancomycin. Quantity feeling better. Will notify nephrology for dialysis needs. [place] and she had different schedule from what she used to, only had 2 sessions of dialysis last week.   DVT prophylaxis:  Code Status: DNR Family Communication:  Disposition Plan:  Diet: Diet renal with fluid restriction Fluid restriction:: 1200 mL Fluid; Room service appropriate?: Yes; Fluid consistency:: Thin  Consultants:   Nephrology  Procedures:   None  Antimicrobials:   Cefepime and vancomycin  Objective: Filed Vitals:   11/29/15 0245 11/29/15 0300 11/29/15 0329 11/29/15 0332  BP: 119/51 109/48 113/48   Pulse: 84 80 81   Temp:   98.8 F (37.1 C)   TempSrc:   Oral   Resp: Height:     (1.6 m)  Weight:    70.58 kg (155 lb 9.6 oz)  SpO2: 98% 98% 96%     Intake/Output Summary (Last 24 hours) at 11/29/15 1211 Last data filed at 11/29/15 1000  Gross per 24 hour  Intake   1063 ml  Output    200 ml  Net    863 ml   Filed Weights   11/28/15 2356 11/29/15 0332  Weight: 68.04 kg (150 lb) 70.58 kg (155 lb 9.6 oz)    Examination: General exam: Appears  calm and comfortable  Respiratory system: Clear to auscultation. Respiratory effort normal. Cardiovascular system: S1 & S2 heard, RRR. No JVD, murmurs, rubs, gallops or clicks. No pedal edema. Gastrointestinal system: Abdomen is nondistended, soft and nontender. No organomegaly or masses felt. Normal bowel sounds heard. Central nervous system: Alert and oriented. No focal neurological deficits. Extremities: Symmetric 5 x 5 power. Skin: No rashes, lesions or ulcers Psychiatry: Judgement and insight appear normal. Mood & affect appropriate.   Data Reviewed: I have personally reviewed following labs and imaging studies  CBC:  Recent Labs Lab 11/29/15 0013 11/29/15 0452  WBC 22.0* 19.9*  HGB 12.4 10.2*  HCT 38.5 32.9*  MCV 102.9* 104.1*  PLT 269 229   Basic Metabolic Panel:  Recent Labs Lab 11/29/15 0013 11/29/15 0452  NA 135 134*  K 3.6 3.5  CL 98* 100*  CO2 25 23  GLUCOSE 87 79  BUN 25* 29*  CREATININE 3.64* 3.76*  CALCIUM 9.0 8.3*   GFR: Estimated Creatinine Clearance: 11.6 mL/min (by C-G formula based on Cr of 3.76). Liver Function Tests: No results for input(s): AST, ALT, ALKPHOS, BILITOT, PROT, ALBUMIN in the last 168 hours. No results for input(s): LIPASE, AMYLASE in the last 168 hours. No results for input(s): AMMONIA in the last 168 hours. Coagulation Profile:  Recent Labs Lab 11/29/15 0452  INR 2.14*   Cardiac Enzymes:  Recent Labs Lab 11/29/15 0452  TROPONINI 0.08*   BNP (last 3 results) No results for input(s): PROBNP in the last 8760 hours. HbA1C: No results for input(s): HGBA1C in the last 72 hours. CBG: No results for input(s): GLUCAP in the last 168 hours. Lipid Profile: No results for input(s): CHOL, HDL, LDLCALC, TRIG, CHOLHDL, LDLDIRECT in the last 72 hours. Thyroid Function Tests: No results for input(s): TSH, T4TOTAL, FREET4, T3FREE, THYROIDAB in the last 72 hours. Anemia Panel: No results for input(s): VITAMINB12, FOLATE,  FERRITIN, TIBC, IRON, RETICCTPCT in the last 72 hours. Urine analysis:    Component Value Date/Time   COLORURINE YELLOW 07/03/2015 2240   APPEARANCEUR CLOUDY* 07/03/2015 2240   LABSPEC 1.017 07/03/2015 2240   PHURINE 5.0 07/03/2015 2240   GLUCOSEU NEGATIVE 07/03/2015 2240   HGBUR NEGATIVE 07/03/2015 2240   BILIRUBINUR NEGATIVE 07/03/2015 2240   KETONESUR NEGATIVE 07/03/2015 2240   PROTEINUR 100* 07/03/2015 2240   NITRITE NEGATIVE 07/03/2015 2240   LEUKOCYTESUR NEGATIVE 07/03/2015 2240   Sepsis Labs: @LABRCNTIP (procalcitonin:4,lacticidven:4)  )No results found for this or any previous visit (from the past 240 hour(s)).   Invalid input(s): PROCALCITONIN, LACTICACIDVEN   Radiology Studies: Dg Chest 2 View  11/29/2015  CLINICAL DATA:  Shortness of breath, cough today. EXAM: CHEST  2 VIEW COMPARISON:  Chest radiograph 07/12/2015, chest CT 07/08/2015 FINDINGS: Left-sided dialysis catheter, tip at the atrial caval junction. Patient is post median sternotomy. Cardiomegaly is stable. Rounded left perihilar and right suprahilar opacities, new from prior exam. There are small pleural effusions. No pneumothorax. IMPRESSION: Bilateral perihilar opacities, differential considerations of multifocal pneumonia versus asymmetric pulmonary edema. Cardiomegaly and small pleural effusions, chronic. Electronically Signed   By: Rubye Oaks M.D.   On: 11/29/2015 01:02        Scheduled Meds: . amiodarone  200 mg Oral Daily  . ceFEPime (MAXIPIME) IV  1 g Intravenous Q24H  . febuxostat  40 mg Oral Daily  . feeding supplement (NEPRO CARB STEADY)  237 mL Oral BID BM  . [START ON 11/30/2015] hydrALAZINE  25 mg Oral BID  . multivitamin  1 tablet Oral Daily  . sodium chloride flush  3 mL Intravenous Q12H  . [START ON 12/01/2015] vancomycin  500 mg Intravenous Q T,Th,Sa-HD  . Warfarin - Pharmacist Dosing Inpatient   Does not apply q1800   Continuous Infusions:    LOS: 0 days    Time spent: 35  minutes    [place]akin[place] Rosamond A, MD [facility] Pager [phone]  If 7PM-7AM, please contact night-coverage [email or web address] Password [email or web address] 11/29/2015, 12:11 PM

## 2015-11-29 NOTE — Progress Notes (Signed)
Pharmacy Antibiotic Note  [name] is a 80 y.o. female admitted on 11/28/2015 with cough/fever, possible sepsis/PNA.  Pharmacy has been consulted for Vancomycin and Cefepime  Dosing.  Vancomycin 1 g IV given in ED at 0130  Plan: Vancomycin 500 mg IV (for total of 1500 mg today), then 500 mg IV after each HD Cefepime 1 g IV q24h F/U plan for HD schedule  Height: 5\' 3"  (160 cm) Weight: 150 lb (68.04 kg) IBW/kg (Calculated) : 52.4  Temp (24hrs), Avg:99.7 F (37.6 C), Min:98.8 F (37.1 C), Max:101.2 F (38.4 C)   Recent Labs Lab 11/29/15 0013 11/29/15 0056 11/29/15 0224  WBC 22.0*  --   --   CREATININE 3.64*  --   --   LATICACIDVEN  --  2.81* 1.81    Estimated Creatinine Clearance: 11.8 mL/min (by C-G formula based on Cr of 3.64).    Allergies  Allergen Reactions  . Latex Swelling  . Statins Other (See Comments)    CLASS REACTION:  DIFFUSE CRAMPING Includes Lipitor, Crestor, Pravachol and simvastatin  . Angiotensin Receptor Blockers Itching and Rash  . Benicar [Olmesartan] Itching and Rash    Antimicrobials this admission: Vancomycin 7/9>>  Cefepime 7/9 >>   Eddie Candle 11/29/2015 3:32 AM

## 2015-11-29 NOTE — Progress Notes (Signed)
ANTICOAGULATION CONSULT NOTE - Follow up Consult  Pharmacy Consult for Coumadin Indication: atrial fibrillation  Allergies  Allergen Reactions  . Latex Swelling and Rash  . Statins Other (See Comments)    CLASS REACTION:  DIFFUSE CRAMPING Includes Lipitor, Crestor, Pravachol and simvastatin  . Angiotensin Receptor Blockers Itching and Rash  . Benicar [Olmesartan] Itching and Rash    Patient Measurements: Height:  (160 cm) Weight: 155 lb 9.6 oz (70.58 kg) IBW/kg (Calculated) : 52.4  Vital Signs: Temp: 98.7 F (37.1 C) (07/09 1226) Temp Source: Oral (07/09 1226) BP: 124/56 mmHg (07/09 1226) Pulse Rate: 82 (07/09 1226)  Labs:  Recent Labs  11/29/15 0013 11/29/15 0452  HGB 12.4 10.2*  HCT 38.5 32.9*  PLT 269 229  LABPROT  --  23.8*  INR  --  2.14*  CREATININE 3.64* 3.76*  TROPONINI  --  0.08*    Estimated Creatinine Clearance: 11.6 mL/min (by C-G formula based on Cr of 3.76).   Medical History: Past Medical History  Diagnosis Date  . CAD in native artery 2007; 08/2015:     a. S/p CABG 2007 (LIMA-LAD, SVG-dRCA, SVG-intermediate, SVG-diagonal). b. Nuclear stress test 08/2012: normal, low risk.; c. RI 100%, o-mRCA 100%, mLAD 60%, dLAD 70%, oD1 99%. LIMA-LAD patent, SVG-PRAV patent, SVG-RI patent, SVG-D1 ~50% dSVG. EDP 26 mHg  . S/P CABG x 4 2007;     LIMA-LAD, SVG-Distal RCA, SVG-Ramus, SVG-Diagonal; no echocardiogram done  . PAD (peripheral artery disease) (HCC) 1997    Mild-moderate carotid disease; status post right SFA occlusion with Fem-Below Knee Pop bypass (Dr. Hart [place]); LEA Dopplers 07/2014: RIGHT - ABI 0.84, CIA/EIA 50-69%, CFA/PFA patent Fem-Pop bypass patent w/ 70-99% anastomotic stenosis, patent Pop A with 3 V runoff; LABI 0.85- L CIA/EIA patent, LPFA 70-99%, dLSFA 50-69%, patent LPopA & 3 V runoff.  . Renovascular hypertension 12/17/2013    a. s/p L RA Stent 11/2013. b. Renal duplex 02/2015: >60% right proximal renal artery stenosis, normal left renal  artery s/p stent, f/u 1 yr recommended.  . CKD (chronic kidney disease), stage III   . Essential hypertension   . Hyperlipidemia LDL goal <70   . Carotid artery disease (HCC)     a. Mild-mod carotid disease per Dr.Berry's note.  . Anemia   . Pneumonia   . CHF (congestive heart failure) (HCC)     Combined systolic and diastolic  . A-fib (HCC)     On amiodarone, warfarin  . Myocardial infarction (HCC)     In setting of pneumonia and A. fib with RVR  . GERD (gastroesophageal reflux disease)   . End-stage renal disease on hemodialysis (HCC) 07/19/2015  . Congestive dilated cardiomyopathy [facility]) February 2017-April 2017    Combination of ischemic and atrial fibrillation related -> EF improved from 20-25% up to 40-45% by recent echo.  . Abnormal lung function test     Minimal restriction-interstitial, moderate-severe diffusion defect. Reduced lung volumes with increased FEV1/FVC ratio and diffusion defect suggest an interstitial process such as fibrosis or interstitial inflammation.    Medications:  Prescriptions prior to admission  Medication Sig Dispense Refill Last Dose  . acetaminophen (TYLENOL) 325 MG tablet Take 650 mg by mouth every 6 (six) hours as needed for moderate pain or fever.    11/29/2015 at Unknown time  . amiodarone (PACERONE) 200 MG tablet Take 1 tablet (200 mg total) by mouth daily. 400 mg daily for 1 week then decrease to 200 mg daily (Patient taking differently: Take 200 mg by  mouth daily. ) 100 tablet 3 11/28/2015 at Unknown time  . betamethasone dipropionate (DIPROLENE) 0.05 % cream Apply 1 application topically 2 (two) times daily as needed. ECZEMA   6 months to 1 year  . Biotin 1000 MCG tablet Take 1,000 mcg by mouth daily.   11/28/2015 at Unknown time  . colchicine 0.6 MG tablet Take 0.6 mg by mouth 2 (two) times daily as needed. GOUT FLARE UP.   > 1 year  . febuxostat (ULORIC) 40 MG tablet Take 40 mg by mouth daily.   11/28/2015 at Unknown time  . hydrALAZINE (APRESOLINE)  25 MG tablet Take 1 tablet by mouth twice daily. You can take an extra 25mg  in the morning if your systolic BP is greater than 160. (Patient taking differently: Take 25-50 mg by mouth See admin instructions. Take 1 tablet by mouth twice daily. You can take an extra 25mg  in the morning if your systolic BP is greater than 160.) 90 tablet 5 11/28/2015 at Unknown time  . LORazepam (ATIVAN) 0.5 MG tablet Take 0.5 mg by mouth 4 (four) times daily as needed for anxiety.   11/29/2015 at Unknown time  . Multiple Vitamins-Minerals (ICAPS) CAPS Take 1 capsule by mouth 2 (two) times daily.   11/28/2015 at Unknown time  . multivitamin (RENA-VIT) TABS tablet Take 1 tablet by mouth daily.   11/28/2015 at Unknown time  . omeprazole (PRILOSEC) 20 MG capsule Take 20 mg by mouth daily.   11/28/2015 at Unknown time  . warfarin (COUMADIN) 2.5 MG tablet Take 1 tablet (2.5 mg total) by mouth daily. Take 1 tablet by mouth daily or as directed by coumadin clinic (Patient taking differently: Take 2.5 mg by mouth daily. 2.5 mg on Sunday. 3.75 mg all other days) 30 tablet 1 11/28/2015 at Unknown time    Assessment: 80 y.o. female admitted with sepsis/PNA,  Afib CVR 80s on amiodarone, to continue Coumadin while inpatient INR 2.14 no bleeding noted, CBC ok Home dose warfarin 3.75mg  qd except 2.5mg  on Mondays  Goal of Therapy:  INR 2-3 Monitor platelets by anticoagulation protocol: Yes   Plan:  Continue home dose 3.75mg  qd except 2.5mg  on Mondays Daily INR  Leota Sauers Pharm.D. CPP, BCPS Clinical Pharmacist [phone] 11/29/2015 12:39 PM

## 2015-11-29 NOTE — H&P (Signed)
History and Physical  Patient Name: [name]     ZOX:[id number]    DOB: [birth year]    DOA: 11/28/2015 PCP: Michiel Sites, MD   Patient coming from: Home  Chief Complaint: Cough and dyspnea and fever  HPI: [name] is a 80 y.o. female with a past medical history significant for ESRD on HD, CAD s/p CABG and CHF with EF 45%, A. fib on amiodarone and warfarin, and PVD with previous femoropopliteal bypass who presents with acute cough and dyspnea.  The patient was driving back from [facility] today when she started to feel vague malaise and decreased appetite. Shortly before arriving home she started to wheeze, and cough, then within a matter of an hour she couldn't breathe, had a rattling productive cough without hemoptysis and felt fever and fatigue, chest soreness and collapsed on the bed so her family brought her to the ER.    ED course: -Febrile to 101.59F, tachycardic, tachypneic, requiring supplemental oxygen -Na 135, K 3.6, WBC 22 K, Hgb 12 -Initial lactate was 2.81 mmolL and troponin was elevated at 0.08 ng/mL -CXR showed L > R opacities and so blood and urine cultures were obtained (she still makes urine) and antibiotics were administered -Her breathing improved markedly with bronchodilators here and TRH were asked to evaluate for admission.  No symptoms preceding this sudden onset.  She usually dialyzes TThS and [place] and with her center in [place] to get an extra session this coming Monday.  She had had some edema at the beach, but no shortness of breath like her previous CHF episodes.  No fever or cough/URI preceding tonight.  She dialyzes through a tunneled central line.   ROS: All other systems negative except as just noted or noted in the history of present illness.    Past Medical History  Diagnosis Date  . CAD in native artery 2007; 08/2015:     a. S/p CABG 2007 (LIMA-LAD, SVG-dRCA,  SVG-intermediate, SVG-diagonal). b. Nuclear stress test 08/2012: normal, low risk.; c. RI 100%, o-mRCA 100%, mLAD 60%, dLAD 70%, oD1 99%. LIMA-LAD patent, SVG-PRAV patent, SVG-RI patent, SVG-D1 ~50% dSVG. EDP 26 mHg  . S/P CABG x 4 2007;     LIMA-LAD, SVG-Distal RCA, SVG-Ramus, SVG-Diagonal; no echocardiogram done  . PAD (peripheral artery disease) (HCC) 1997    Mild-moderate carotid disease; status post right SFA occlusion with Fem-Below Knee Pop bypass (Dr. Hart Rochester); LEA Dopplers 07/2014: RIGHT - ABI 0.84, CIA/EIA 50-69%, CFA/PFA patent Fem-Pop bypass patent w/ 70-99% anastomotic stenosis, patent Pop A with 3 V runoff; LABI 0.85- L CIA/EIA patent, LPFA 70-99%, dLSFA 50-69%, patent LPopA & 3 V runoff.  . Renovascular hypertension 12/17/2013    a. s/p L RA Stent 11/2013. b. Renal duplex 02/2015: >60% right proximal renal artery stenosis, normal left renal artery s/p stent, f/u 1 yr recommended.  . CKD (chronic kidney disease), stage III   . Essential hypertension   . Hyperlipidemia LDL goal <70   . Carotid artery disease (HCC)     a. Mild-mod carotid disease per Dr.Berry's note.  . Anemia   . Pneumonia   . CHF (congestive heart failure) (HCC)     Combined systolic and diastolic  . A-fib (HCC)     On amiodarone, warfarin  . Myocardial infarction (HCC)     In setting of pneumonia and A. fib with RVR  . GERD (gastroesophageal reflux disease)   . End-stage renal disease  on hemodialysis (HCC) 07/19/2015  . Congestive dilated cardiomyopathy [facility]) February 2017-April 2017    Combination of ischemic and atrial fibrillation related -> EF improved from 20-25% up to 40-45% by recent echo.  . Abnormal lung function test     Minimal restriction-interstitial, moderate-severe diffusion defect. Reduced lung volumes with increased FEV1/FVC ratio and diffusion defect suggest an interstitial process such as fibrosis or interstitial inflammation.    Past Surgical History  Procedure Laterality Date  . Cardiac  catheterization  December 2007    LAD-90% mid, D19 percent ostial. Circumflex-OM1 90% ostial, 80% mid. RCA 9% proximal, 100% mid  . Femoropopliteal bypass Right 1997    Dr. Hart Rochester: SFA-below knee Pop  . Coronary artery bypass graft  December 2007    LIMA-LAD, SVG-distal RCA, SVG-D1, SVG-OM1/Ramus  . Nm myoview ltd  April 2014    EF 63%, mild fixed anteroapical defect thought to be breast attenuation  . Appendectomy    . Tonsillectomy    . Cataract surgery    . Rectovaginal fistula repair  1997  . [place] [facility]  1981  . Renal artery stent Left 12/16/13    using CO2  . Lower extremity venous doppler  06/13/2011    No evidence of thrombus or thrombophlebitis, no venous insuffiency noted.  . Cardiovascular stress test  08/28/2012    Normal stress nuclear study with likely breast attenuation, low risk stress test.  . Renal angiogram N/A 12/16/2013    Procedure: RENAL ANGIOGRAM;  Surgeon: Runell Gess, MD;  Location: [facility] CATH LAB;  Service: Cardiovascular;  Laterality: N/A;  . Percutaneous stent intervention Left 12/16/2013    Procedure: PERCUTANEOUS STENT INTERVENTION - LEFT RENAL ARTERY;  Surgeon: Runell Gess, MD;  Location: [facility] CATH LAB;  Service: Cardiovascular;  Laterality: Left;  renal  . Cardiac catheterization N/A 07/09/2015    Procedure: Right Heart Cath;  Surgeon: Dolores Patty, MD;  Location: [facility] INVASIVE CV LAB;  Service: Cardiovascular;  Laterality: N/A;  . Cardiac catheterization N/A 07/13/2015    Procedure: Left Heart Cath and Cors/Grafts Angiography;  Surgeon: Marykay Lex, MD;  Location: [facility] INVASIVE CV LAB;  Service: Cardiovascular:  RI 100%, o-mRCA 100%, mLAD 60%, dLAD 70%, oD1 99%. LIMA-LAD patent, SVG-PRAV patent, SVG-RI patent, SVG-D1 ~50% dSVG. EDP 26 mHg  . Peripheral vascular catheterization N/A 07/13/2015    Procedure: Renal Angiography;  Surgeon: Marykay Lex, MD;  Location: [facility] INVASIVE CV LAB;  Service: Cardiovascular: Bilateral renal arteries are patent  . Back  surgery    . Colonoscopy    . Abdominal hysterectomy    . Av fistula placement Left 09/02/2015    Procedure: RADIAL - CEPHALIC ARTERIOVENOUS (AV) FISTULA CREATION LEFT ARM;  Surgeon: Pryor Ochoa, MD;  Location: [facility] OR;  Service: Vascular;  Laterality: Left;  . Transthoracic echocardiogram  08/26/2015    EF 45-50% mild concentric Vertebrae. Anteroseptal hypokinesis. Pseudo-normal/grade 2 diastolic dysfunction. Moderate AI. Mild MR. Severe LA dilation. Mild RV dilation. Mild TR. Significant improvement in LVEF.    Social History: Patient lives with husband.  The patient walks unassisted.  Former smoker.   Allergies  Allergen Reactions  . Latex Swelling  . Statins Other (See Comments)    CLASS REACTION:  DIFFUSE CRAMPING Includes Lipitor, Crestor, Pravachol and simvastatin  . Angiotensin Receptor Blockers Itching and Rash  . Benicar [Olmesartan] Itching and Rash    Family history: family history includes Breast cancer in her mother; Cancer - Cervical in her brother; Heart attack in  her father.  Prior to Admission medications   Medication Sig Start Date End Date Taking? Authorizing Provider  acetaminophen (TYLENOL) 325 MG tablet Take 650 mg by mouth every 6 (six) hours as needed for moderate pain.    Historical Provider, MD  amiodarone (PACERONE) 200 MG tablet Take 1 tablet (200 mg total) by mouth daily. 400 mg daily for 1 week then decrease to 200 mg daily Patient taking differently: Take 200 mg by mouth daily.  08/14/15   Beatrice Lecher, PA-C  betamethasone dipropionate (DIPROLENE) 0.05 % cream Apply 1 application topically 2 (two) times daily as needed. ECZEMA    Historical Provider, MD  colchicine 0.6 MG tablet Take 0.6 mg by mouth 2 (two) times daily as needed. GOUT FLARE UP.    Historical Provider, MD  febuxostat (ULORIC) 40 MG tablet Take 40 mg by mouth daily.    Historical Provider, MD  hydrALAZINE (APRESOLINE) 25 MG tablet Take 1 tablet by mouth twice daily. You can take an extra  25mg  in the morning if your systolic BP is greater than 160. 11/13/15   Marykay Lex, MD  LORazepam (ATIVAN) 0.5 MG tablet Take 0.5 mg by mouth 4 (four) times daily as needed for anxiety.    Historical Provider, MD  Multiple Vitamins-Minerals (ICAPS) CAPS Take 1 capsule by mouth 2 (two) times daily.    Historical Provider, MD  multivitamin (RENA-VIT) TABS tablet Take 1 tablet by mouth daily.    Historical Provider, MD  Nutritional Supplements (FEEDING SUPPLEMENT, NEPRO CARB STEADY,) LIQD Take 237 mLs by mouth 2 (two) times daily between meals. 07/19/15   Nishant Dhungel, MD  omeprazole (PRILOSEC) 20 MG capsule Take 20 mg by mouth daily.    Historical Provider, MD  warfarin (COUMADIN) 2.5 MG tablet Take 1 tablet (2.5 mg total) by mouth daily. Take 1 tablet by mouth daily or as directed by coumadin clinic 09/02/15   Ames Coupe Rhyne, PA-C  warfarin (COUMADIN) 2.5 MG tablet Take 1-1.5 tablets by mouth daily as directed by coumadin clinic 10/27/15   Marykay Lex, MD       Physical Exam: BP 103/47 mmHg  Pulse 85  Temp(Src) 99 F (37.2 C) (Oral)  Resp 15  Ht 5\' 3"  (1.6 m)  Wt 68.04 kg (150 lb)  BMI 26.58 kg/m2  SpO2 98% General appearance: Well-developed, elderly female, alert and in no acute distress.   Eyes: Conjunctiva normal, lids and lashes normal.   PERRL.  ENT: No nasal deformity, discharge.  OP moist without lesions.   Lymph: No cervical or supraclavicular lymphadenopathy. Skin: Warm and dry.  No suspicious rashes or lesions. Cardiac: RRR, nl S1-S2, soft SEM.  Capillary refill is brisk.  JVP normal.  No LE edema.  Radial and DP pulses 2+ and symmetric. Respiratory: Normal respiratory rate and rhythm.  No wheezes.  Diminished more on left, no rales appreciated. GI: Abdomen soft without rigidity.  No TTP. No ascites, distension, hepatosplenomegaly.   MSK: No deformities or effusions.  No clubbing/cyanosis. Neuro: Cranial nerves normal. Sensorium intact and responding to questions,  attention normal.  Speech is fluent.  Moves all extremities equally and with normal coordination.    Psych: Affect normal, pleasant.  Judgment and insight appear normal.       Labs on Admission:  I have personally reviewed following labs and imaging studies: CBC:  Recent Labs Lab 11/29/15 0013  WBC 22.0*  HGB 12.4  HCT 38.5  MCV 102.9*  PLT 269   Basic  Metabolic Panel:  Recent Labs Lab 11/29/15 0013  NA 135  K 3.6  CL 98*  CO2 25  GLUCOSE 87  BUN 25*  CREATININE 3.64*  CALCIUM 9.0   GFR: Estimated Creatinine Clearance: 11.8 mL/min (by C-G formula based on Cr of 3.64).  Sepsis Labs: Lactic acid 2.8 --> resolved to normal with fluids         Radiological Exams on Admission: Personally reviewed: Dg Chest 2 View  11/29/2015  CLINICAL DATA:  Shortness of breath, cough today. EXAM: CHEST  2 VIEW COMPARISON:  Chest radiograph 07/12/2015, chest CT 07/08/2015 FINDINGS: Left-sided dialysis catheter, tip at the atrial caval junction. Patient is post median sternotomy. Cardiomegaly is stable. Rounded left perihilar and right suprahilar opacities, new from prior exam. There are small pleural effusions. No pneumothorax. IMPRESSION: Bilateral perihilar opacities, differential considerations of multifocal pneumonia versus asymmetric pulmonary edema. Cardiomegaly and small pleural effusions, chronic. Electronically Signed   By: Rubye Oaks M.D.   On: 11/29/2015 01:02    EKG: Independently reviewed. Rate 88, QTc 467, L axis, sinus rhythm.  No change from prvious.    Assessment/Plan Active Problems:   HCAP (healthcare-associated pneumonia)  1. HCAP with sepsis:  Suspected source lung. Organism unknown. Patient meets criteria given tachycardia, tachypnea, fever, leukocytosis, and evidence of organ dysfunction.  Lactate 2.81 mmol/L and repeat ordered within 6 hours.  This patient is not at high risk of poor outcomes with a SOFA score of 1.  Antibiotics delivered in the ED.     -Sepsis bundle utilized:  -Blood and urine cultures drawn  -Start targeted antibiotics with vancomycin and cefepime, based on suspected source of infection pneumonia in HD patient  -Repeat renal function and complete blood count in AM  -Code SEPSIS called to E-link  -Strep pneumo and legionella antigens  -Procalcitonin ordered    2. ESRD:  -Consult to Nephrology for maintenance HD, appreciate cares -Continue renavite  3. Elevated troponin:  Suspect low clearance in ESRD patient -Cycle enzymes  4. Chronic systolic CHF:  EF 45-50% earlier this year, moderate AR, grade 2 diastolic dysfunction. -Continue hydralazine -Not on furosemide  5. HTN, CAD s/p CABG and peripheral vascular disease s/p fempop bypass:  -Continue hydralazine with hold parameters  6. pAF:  On amiodarone.  CHADS2-VASc 7. On warfarin. -Continue amiodarone -Continue warfarin  7. Gout:  -Continue Uloric     DVT prophylaxis: Not needed, on warfarin  Code Status: DO NOT RESUSCITATE  Family Communication: Daughter at bedsdie  Disposition Plan: Anticipate IV fluids cautiously and antibiotics.  Will admit to telemetry and monitor antibiotics data. Consults called: Nephrology Admission status: INPATIENT, tele    Medical decision making: Patient seen at 2:23 AM on 11/29/2015.  The patient was discussed with Dr. Donnald Garre. What exists of the patient's chart was reviewed in depth.  Clinical condition: had rapid deterioration of breathing, but now appears just as rapidly better.        Alberteen Sam [facility] Pager [phone]     At the time of admission, it appears that the appropriate admission status for this patient is INPATIENT. This is judged to be reasonable and necessary in order to provide the required intensity of service to ensure the patient's safety given the presenting symptoms, physical exam findings, and initial radiographic and laboratory data in the context of their chronic  comorbidities.  Together, these circumstances are felt to place her/him at high risk for further clinical deterioration threatening life, limb, or organ. The following factors  support the admission status of inpatient:   A. The patient's presenting symptoms include respiratory distress and cough B. The worrisome physical exam findings include fever, tachycarida, tachypnea, decreased breath sounds C. The initial radiographic and laboratory data are worrisome because of leukocytosis, troponin elevateion, lactic acidosis, opacity on chest x-ray D. The chronic co-morbidities include ESRD, CHF with reduced EF, peripheral vascular disease, HTN and Afib on chronic anticoagulation. E. Patient requires inpatient status due to high intensity of service, high risk for further deterioration and high frequency of surveillance required. F. I certify that at the point of admission it is my clinical judgment that the patient will require inpatient hospital care spanning beyond 2 midnights from the point of admission.

## 2015-11-29 NOTE — ED Notes (Signed)
CRITICAL VALUE ALERT  Critical value received:  Troponin 0.08   

## 2015-11-29 NOTE — Progress Notes (Signed)
CRITICAL VALUE ALERT  Critical value received:  Troponin 0.08  Date of notification:  7/9  Time of notification:  0647  Critical value read back:Yes.    Nurse who received alert:  J.Aneya Daddona  MD notified (1st page):  N.Carter  Time of first page:  [phone]  MD notified (2nd page):  Time of second page:  Responding MD:  N.Carter  Time MD responded:  [phone]

## 2015-11-29 NOTE — ED Notes (Signed)
Report attempted, RN to call back. 

## 2015-11-30 DIAGNOSIS — N186 End stage renal disease: Secondary | ICD-10-CM

## 2015-11-30 DIAGNOSIS — I481 Persistent atrial fibrillation: Secondary | ICD-10-CM

## 2015-11-30 DIAGNOSIS — J189 Pneumonia, unspecified organism: Secondary | ICD-10-CM

## 2015-11-30 DIAGNOSIS — I739 Peripheral vascular disease, unspecified: Secondary | ICD-10-CM

## 2015-11-30 DIAGNOSIS — I10 Essential (primary) hypertension: Secondary | ICD-10-CM

## 2015-11-30 LAB — BASIC METABOLIC PANEL
Anion gap: 13 (ref 5–15)
BUN: 49 mg/dL — ABNORMAL HIGH (ref 6–20)
CALCIUM: 8.4 mg/dL — AB (ref 8.9–10.3)
CO2: 21 mmol/L — ABNORMAL LOW (ref 22–32)
CREATININE: 4.82 mg/dL — AB (ref 0.44–1.00)
Chloride: 97 mmol/L — ABNORMAL LOW (ref 101–111)
GFR calc non Af Amer: 8 mL/min — ABNORMAL LOW (ref >60)
GFR, EST AFRICAN AMERICAN: 9 mL/min — AB (ref >60)
Glucose, Bld: 77 mg/dL (ref 65–99)
Potassium: 4 mmol/L (ref 3.5–5.1)
SODIUM: 131 mmol/L — AB (ref 135–145)

## 2015-11-30 LAB — PROTIME-INR
INR: 3.14 — AB (ref 0.00–1.49)
PROTHROMBIN TIME: 31.7 s — AB (ref 11.6–15.2)

## 2015-11-30 MED ORDER — ALTEPLASE 2 MG IJ SOLR: 2.0000 mg | Freq: Once | INTRAMUSCULAR | Status: DC | PRN

## 2015-11-30 MED ORDER — SODIUM CHLORIDE 0.9 % IV SOLN: 100.0000 mL | INTRAVENOUS | Status: DC | PRN

## 2015-11-30 MED ORDER — DEXTROSE 5 % IV SOLN
2.0000 g | INTRAVENOUS | Status: DC
  Administered 2015-12-02 – 2015-12-03 (×2): 2 g via INTRAVENOUS
  Filled 2015-11-30 (×4): qty 2

## 2015-11-30 MED ORDER — LIDOCAINE-PRILOCAINE 2.5-2.5 % EX CREA: 1.0000 "application " | TOPICAL_CREAM | CUTANEOUS | Status: DC | PRN

## 2015-11-30 MED ORDER — PENTAFLUOROPROP-TETRAFLUOROETH EX AERO: 1.0000 "application " | INHALATION_SPRAY | CUTANEOUS | Status: DC | PRN

## 2015-11-30 MED ORDER — LORAZEPAM 0.5 MG PO TABS
0.5000 mg | ORAL_TABLET | Freq: Four times a day (QID) | ORAL | Status: DC | PRN
  Administered 2015-11-30 – 2015-12-03 (×7): 0.5 mg via ORAL
  Filled 2015-11-30 (×7): qty 1

## 2015-11-30 MED ORDER — HEPARIN SODIUM (PORCINE) 1000 UNIT/ML DIALYSIS: 20.0000 [IU]/kg | INTRAMUSCULAR | Status: DC | PRN

## 2015-11-30 MED ORDER — HEPARIN SODIUM (PORCINE) 1000 UNIT/ML DIALYSIS: 1000.0000 [IU] | INTRAMUSCULAR | Status: DC | PRN

## 2015-11-30 MED ORDER — LIDOCAINE HCL (PF) 1 % IJ SOLN: 5.0000 mL | INTRAMUSCULAR | Status: DC | PRN

## 2015-11-30 NOTE — Care Management Important Message (Signed)
Important Message  Patient Details  Name: [name] MRN: [id number] Date of Birth: [birth year]   Medicare Important Message Given:  Yes    Tenzin Pavon, Stephan Minister 11/30/2015, 8:21 AM

## 2015-11-30 NOTE — Progress Notes (Signed)
PROGRESS NOTE  [name]  RJJ:[id number] DOB: [birth year] DOA: 11/28/2015 PCP: Michiel Sites, MD Outpatient Specialists:  Subjective: Seen with her daughter at bedside, denies any complaints feels much better than yesterday.  Brief Narrative:  80 year old female with history of a ESRD and chronic systolic CHF came in with symptoms of pneumonia and mildly septic.  Assessment & Plan:   Principal Problem:   Sepsis (HCC) Active Problems:   CAD in native artery - s/p CABG 4   PAD (peripheral artery disease) (HCC)   Hypertension, essential   Persistent atrial fibrillation (HCC)   End stage renal disease (HCC)   HCAP (healthcare-associated pneumonia)   Sepsis -Presented with tachycardia, tachypnea, fever in the presence of pneumonia, meets sepsis criteria on admission. -Lactate is 2.1 indicating endorgan damage. -Treated with broad-spectrum antibiotics, no IV fluids because of ESRD. -Sepsis is secondary to pneumonia and septicemia  Streptococcus pneumoniae bacteremia -Likely secondary to pneumonia, blood cultures 1 out of 2 showed Streptococcus pneumoniae. -Started on broad-spectrum antibiotics, will switch to 2 g of Rocephin.  Pneumonia, HCAP -CXR showed multifocal pneumonia, patient was on Zosyn and vancomycin treat as HCAP. -Has a Streptococcus bacteremia and urine is positive for Streptococcus antigen, will switch antibiotic to 2 g of Rocephin. -Continue supportive management with bronchodilators, mucolytics, antitussives and oxygen as needed.  ESRD:  -Consult to Nephrology for maintenance HD, appreciate cares -Continue renavite  Elevated troponin:  Suspect low clearance in ESRD patient -Cycle enzymes  Chronic systolic CHF:  EF 45-50% earlier this year, moderate AR, grade 2 diastolic dysfunction. -Continue hydralazine -Not on furosemide  HTN, CAD s/p CABG and peripheral vascular disease s/p fempop bypass:  -Continue hydralazine with hold  parameters  pAF:  On amiodarone. CHADS2-VASc 7. On warfarin. -Continue amiodarone -Continue warfarin  Gout:  -Continue Uloric   DVT prophylaxis:  Code Status: DNR Family Communication:  Disposition Plan:  Diet: Diet renal with fluid restriction Fluid restriction:: 1200 mL Fluid; Room service appropriate?: Yes; Fluid consistency:: Thin  Consultants:   Nephrology  Procedures:   None  Antimicrobials:   Cefepime and vancomycin  Objective: Filed Vitals:   11/29/15 2001 11/30/15 0606 11/30/15 0847 11/30/15 1154  BP: 125/50 144/67 152/65 159/65  Pulse: 72 76 76 77  Temp: 98.3 F (36.8 C) 97.7 F (36.5 C) 98.6 F (37 C) 98.6 F (37 C)  TempSrc: Oral Oral Oral Oral  Resp: 18 18 18 18   Height:      Weight:  71.033 kg (156 lb 9.6 oz)    SpO2: 98% 99% 99% 99%    Intake/Output Summary (Last 24 hours) at 11/30/15 1252 Last data filed at 11/30/15 0744  Gross per 24 hour  Intake   1010 ml  Output     51 ml  Net    959 ml   Filed Weights   11/28/15 2356 11/29/15 0332 11/30/15 0606  Weight: 68.04 kg (150 lb) 70.58 kg (155 lb 9.6 oz) 71.033 kg (156 lb 9.6 oz)    Examination: General exam: Appears calm and comfortable  Respiratory system: Clear to auscultation. Respiratory effort normal. Cardiovascular system: S1 & S2 heard, RRR. No JVD, murmurs, rubs, gallops or clicks. No pedal edema. Gastrointestinal system: Abdomen is nondistended, soft and nontender. No organomegaly or masses felt. Normal bowel sounds heard. Central nervous system: Alert and oriented. No focal neurological deficits. Extremities: Symmetric 5 x 5 power. Skin: No rashes, lesions or ulcers Psychiatry: Judgement and insight appear normal. Mood & affect appropriate.   Data Reviewed: I  have personally reviewed following labs and imaging studies  CBC:  Recent Labs Lab 11/29/15 0013 11/29/15 0452  WBC 22.0* 19.9*  HGB 12.4 10.2*  HCT 38.5 32.9*  MCV 102.9* 104.1*  PLT 269 229   Basic  Metabolic Panel:  Recent Labs Lab 11/29/15 0013 11/29/15 0452 11/30/15 0320  NA 135 134* 131*  K 3.6 3.5 4.0  CL 98* 100* 97*  CO2 25 23 21*  GLUCOSE 87 79 77  BUN 25* 29* 49*  CREATININE 3.64* 3.76* 4.82*  CALCIUM 9.0 8.3* 8.4*   GFR: Estimated Creatinine Clearance: 9.1 mL/min (by C-G formula based on Cr of 4.82). Liver Function Tests: No results for input(s): AST, ALT, ALKPHOS, BILITOT, PROT, ALBUMIN in the last 168 hours. No results for input(s): LIPASE, AMYLASE in the last 168 hours. No results for input(s): AMMONIA in the last 168 hours. Coagulation Profile:  Recent Labs Lab 11/29/15 0452 11/30/15 0320  INR 2.14* 3.14*   Cardiac Enzymes:  Recent Labs Lab 11/29/15 0452  TROPONINI 0.08*   BNP (last 3 results) No results for input(s): PROBNP in the last 8760 hours. HbA1C: No results for input(s): HGBA1C in the last 72 hours. CBG: No results for input(s): GLUCAP in the last 168 hours. Lipid Profile: No results for input(s): CHOL, HDL, LDLCALC, TRIG, CHOLHDL, LDLDIRECT in the last 72 hours. Thyroid Function Tests: No results for input(s): TSH, T4TOTAL, FREET4, T3FREE, THYROIDAB in the last 72 hours. Anemia Panel: No results for input(s): VITAMINB12, FOLATE, FERRITIN, TIBC, IRON, RETICCTPCT in the last 72 hours. Urine analysis:    Component Value Date/Time   COLORURINE AMBER* 11/29/2015 1559   APPEARANCEUR TURBID* 11/29/2015 1559   LABSPEC 1.027 11/29/2015 1559   PHURINE 5.0 11/29/2015 1559   GLUCOSEU NEGATIVE 11/29/2015 1559   HGBUR NEGATIVE 11/29/2015 1559   BILIRUBINUR SMALL* 11/29/2015 1559   KETONESUR 15* 11/29/2015 1559   PROTEINUR 100* 11/29/2015 1559   NITRITE NEGATIVE 11/29/2015 1559   LEUKOCYTESUR LARGE* 11/29/2015 1559   Sepsis Labs: (procalcitonin:4,lacticidven:4)  ) Recent Results (from the past 240 hour(s))  Culture, blood (routine x 2)     Status: Abnormal (Preliminary result)   Collection Time: 11/29/15 12:13 AM  Result  Value Ref Range Status   Specimen Description BLOOD RIGHT ANTECUBITAL  Final   Special Requests BOTTLES DRAWN AEROBIC AND ANAEROBIC 5CC   Final   Culture  Setup Time   Final    GRAM POSITIVE COCCI IN PAIRS AND CHAINS IN BOTH AEROBIC AND ANAEROBIC BOTTLES CRITICAL RESULT CALLED TO, READ BACK BY AND VERIFIED WITH: J MARKLE,PHARMD AT 1525 11/29/15 BY L BENFIELD    Culture (A)  Final    STREPTOCOCCUS PNEUMONIAE SUSCEPTIBILITIES TO FOLLOW    Report Status PENDING  Incomplete  Blood Culture ID Panel (Reflexed)     Status: Abnormal   Collection Time: 11/29/15 12:13 AM  Result Value Ref Range Status   Enterococcus species NOT DETECTED NOT DETECTED Final   Vancomycin resistance NOT DETECTED NOT DETECTED Final   Listeria monocytogenes NOT DETECTED NOT DETECTED Final   Staphylococcus species NOT DETECTED NOT DETECTED Final   Staphylococcus aureus NOT DETECTED NOT DETECTED Final   Methicillin resistance NOT DETECTED NOT DETECTED Final   Streptococcus species DETECTED (A) NOT DETECTED Final    Comment: CRITICAL RESULT CALLED TO, READ BACK BY AND VERIFIED WITH: J MARKLE,PHARMD AT 1525 11/29/15 BY L BENFIELD    Streptococcus agalactiae NOT DETECTED NOT DETECTED Final   Streptococcus pneumoniae DETECTED (A) NOT DETECTED Final  Comment: CRITICAL RESULT CALLED TO, READ BACK BY AND VERIFIED WITH: J MARKLE,PHARMD AT 1525 11/29/15 BY L BENFIELD    Streptococcus pyogenes NOT DETECTED NOT DETECTED Final   Acinetobacter baumannii NOT DETECTED NOT DETECTED Final   Enterobacteriaceae species NOT DETECTED NOT DETECTED Final   Enterobacter cloacae complex NOT DETECTED NOT DETECTED Final   Escherichia coli NOT DETECTED NOT DETECTED Final   Klebsiella oxytoca NOT DETECTED NOT DETECTED Final   Klebsiella pneumoniae NOT DETECTED NOT DETECTED Final   Proteus species NOT DETECTED NOT DETECTED Final   Serratia marcescens NOT DETECTED NOT DETECTED Final   Carbapenem resistance NOT DETECTED NOT DETECTED Final    Haemophilus influenzae NOT DETECTED NOT DETECTED Final   Neisseria meningitidis NOT DETECTED NOT DETECTED Final   Pseudomonas aeruginosa NOT DETECTED NOT DETECTED Final   Candida albicans NOT DETECTED NOT DETECTED Final   Candida glabrata NOT DETECTED NOT DETECTED Final   Candida krusei NOT DETECTED NOT DETECTED Final   Candida parapsilosis NOT DETECTED NOT DETECTED Final   Candida tropicalis NOT DETECTED NOT DETECTED Final  Culture, blood (routine x 2)     Status: None (Preliminary result)   Collection Time: 11/29/15  2:05 AM  Result Value Ref Range Status   Specimen Description BLOOD RIGHT HAND  Final   Special Requests IN PEDIATRIC BOTTLE 3CC  Final   Culture NO GROWTH 1 DAY  Final   Report Status PENDING  Incomplete     Invalid input(s): PROCALCITONIN, LACTICACIDVEN   Radiology Studies: Dg Chest 2 View  11/29/2015  CLINICAL DATA:  Shortness of breath, cough today. EXAM: CHEST  2 VIEW COMPARISON:  Chest radiograph 07/12/2015, chest CT 07/08/2015 FINDINGS: Left-sided dialysis catheter, tip at the atrial caval junction. Patient is post median sternotomy. Cardiomegaly is stable. Rounded left perihilar and right suprahilar opacities, new from prior exam. There are small pleural effusions. No pneumothorax. IMPRESSION: Bilateral perihilar opacities, differential considerations of multifocal pneumonia versus asymmetric pulmonary edema. Cardiomegaly and small pleural effusions, chronic. Electronically Signed   By: Rubye Oaks M.D.   On: 11/29/2015 01:02        Scheduled Meds: . amiodarone  200 mg Oral Daily  . ceFEPime (MAXIPIME) IV  1 g Intravenous Q24H  . febuxostat  40 mg Oral Daily  . feeding supplement (NEPRO CARB STEADY)  237 mL Oral BID BM  . hydrALAZINE  25 mg Oral BID  . multivitamin  1 tablet Oral Daily  . sodium chloride flush  3 mL Intravenous Q12H  . [START ON 12/01/2015] vancomycin  500 mg Intravenous Q T,Th,Sa-HD  . Warfarin - Pharmacist Dosing Inpatient   Does not  apply q1800   Continuous Infusions:    LOS: 1 day    Time spent: 35 minutes    Ragna Kramlich A, MD [facility] Pager [phone]  If 7PM-7AM, please contact night-coverage [email or web address] Password [email or web address] 11/30/2015, 12:52 PM

## 2015-11-30 NOTE — Progress Notes (Signed)
ANTICOAGULATION CONSULT NOTE - Follow Up Consult  Pharmacy Consult for Coumadin Indication: atrial fibrillation  Allergies  Allergen Reactions  . Latex Swelling and Rash  . Statins Other (See Comments)    CLASS REACTION:  DIFFUSE CRAMPING Includes Lipitor, Crestor, Pravachol and simvastatin  . Angiotensin Receptor Blockers Itching and Rash  . Benicar [Olmesartan] Itching and Rash    Patient Measurements: Height: 5\' 3"  (160 cm) Weight: 156 lb 9.6 oz (71.033 kg) (scale c) IBW/kg (Calculated) : 52.4  Vital Signs: Temp: 97.7 F (36.5 C) (07/10 0606) Temp Source: Oral (07/10 0606) BP: 144/67 mmHg (07/10 0606) Pulse Rate: 76 (07/10 0606)  Labs:  Recent Labs  11/29/15 0013 11/29/15 0452 11/30/15 0320  HGB 12.4 10.2*  --   HCT 38.5 32.9*  --   PLT 269 229  --   LABPROT  --  23.8* 31.7*  INR  --  2.14* 3.14*  CREATININE 3.64* 3.76* 4.82*  TROPONINI  --  0.08*  --     Estimated Creatinine Clearance: 9.1 mL/min (by C-G formula based on Cr of 4.82).   Assessment:  Anticoagulation  Coumadin for Afib INR 2.14>3.14 overnight. No new CBC today.  Goal of Therapy:  INR 2-3 Monitor platelets by anticoagulation protocol: Yes   Plan:  Hold Coumadin today. Awaiting renal note for scheduling of abx.   Walton Digilio S. Merilynn Finland, PharmD, BCPS Clinical Staff Pharmacist Pager [phone]  Misty Stanley Stillinger 11/30/2015,8:23 AM

## 2015-11-30 NOTE — Consult Note (Signed)
[facility] Renal Consultation Note    Indication for Consultation:  Management of ESRD/hemodialysis; anemia, hypertension/volume and secondary hyperparathyroidism  HPI: [name] is a 80 y.o. female with ESRD (TTS NW) secondary to nephrosclerosis/cardiorenal syndrome on HD since February of this year, with CAD s/p CABG, PAD, HTN, hx AF who was out of town for several days at [employer].  She started feeling bad this past Saturday on the way home and shortly before she got home she had cough, wheezing and increasing SOB.  She felt feverish and fatigued and needed the heat turned on in the car because she was so cold.  She was brought to the ED were she was found to have an elevated temp to 101.2, elevated WBC 22 K, CXR showed L>R opacities + excess volume. Initial troponin 0.08  Procalcitonin  7.76. [place] and was uneventful by report. She started feeling better on Sunday. She has not required any supplemental O2.  She has no N, V, D. Her AVF is due to be used 7/12   Past Medical History  Diagnosis Date  . CAD in native artery 2007; 08/2015:     a. S/p CABG 2007 (LIMA-LAD, SVG-dRCA, SVG-intermediate, SVG-diagonal). b. Nuclear stress test 08/2012: normal, low risk.; c. RI 100%, o-mRCA 100%, mLAD 60%, dLAD 70%, oD1 99%. LIMA-LAD patent, SVG-PRAV patent, SVG-RI patent, SVG-D1 ~50% dSVG. EDP 26 mHg  . S/P CABG x 4 2007;     LIMA-LAD, SVG-Distal RCA, SVG-Ramus, SVG-Diagonal; no echocardiogram done  . PAD (peripheral artery disease) (HCC) 1997    Mild-moderate carotid disease; status post right SFA occlusion with Fem-Below Knee Pop bypass (Dr. Hart Rochester); LEA Dopplers 07/2014: RIGHT - ABI 0.84, CIA/EIA 50-69%, CFA/PFA patent Fem-Pop bypass patent w/ 70-99% anastomotic stenosis, patent Pop A with 3 V runoff; LABI 0.85- L CIA/EIA patent, LPFA 70-99%, dLSFA 50-69%, patent LPopA & 3 V runoff.  . Renovascular hypertension 12/17/2013    a. s/p L  RA Stent 11/2013. b. Renal duplex 02/2015: >60% right proximal renal artery stenosis, normal left renal artery s/p stent, f/u 1 yr recommended.  . CKD (chronic kidney disease), stage III   . Essential hypertension   . Hyperlipidemia LDL goal <70   . Carotid artery disease (HCC)     a. Mild-mod carotid disease per Dr.Berry's note.  . Anemia   . Pneumonia   . CHF (congestive heart failure) (HCC)     Combined systolic and diastolic  . A-fib (HCC)     On amiodarone, warfarin  . Myocardial infarction (HCC)     In setting of pneumonia and A. fib with RVR  . GERD (gastroesophageal reflux disease)   . End-stage renal disease on hemodialysis (HCC) 07/19/2015  . Congestive dilated cardiomyopathy [facility]) February 2017-April 2017    Combination of ischemic and atrial fibrillation related -> EF improved from 20-25% up to 40-45% by recent echo.  . Abnormal lung function test     Minimal restriction-interstitial, moderate-severe diffusion defect. Reduced lung volumes with increased FEV1/FVC ratio and diffusion defect suggest an interstitial process such as fibrosis or interstitial inflammation.   Past Surgical History  Procedure Laterality Date  . Cardiac catheterization  December 2007    LAD-90% mid, D19 percent ostial. Circumflex-OM1 90% ostial, 80% mid. RCA 9% proximal, 100% mid  . Femoropopliteal bypass Right 1997    Dr. Hart Rochester: SFA-below knee Pop  . Coronary artery bypass graft  December 2007    LIMA-LAD,  SVG-distal RCA, SVG-D1, SVG-OM1/Ramus  . Nm myoview ltd  April 2014    EF 63%, mild fixed anteroapical defect thought to be breast attenuation  . Appendectomy    . Tonsillectomy    . Cataract surgery    . Rectovaginal fistula repair  1997  . [place] [facility]  1981  . Renal artery stent Left 12/16/13    using CO2  . Lower extremity venous doppler  06/13/2011    No evidence of thrombus or thrombophlebitis, no venous insuffiency noted.  . Cardiovascular stress test  08/28/2012    Normal stress nuclear  study with likely breast attenuation, low risk stress test.  . Renal angiogram N/A 12/16/2013    Procedure: RENAL ANGIOGRAM;  Surgeon: Runell Gess, MD;  Location: [facility] CATH LAB;  Service: Cardiovascular;  Laterality: N/A;  . Percutaneous stent intervention Left 12/16/2013    Procedure: PERCUTANEOUS STENT INTERVENTION - LEFT RENAL ARTERY;  Surgeon: Runell Gess, MD;  Location: [facility] CATH LAB;  Service: Cardiovascular;  Laterality: Left;  renal  . Cardiac catheterization N/A 07/09/2015    Procedure: Right Heart Cath;  Surgeon: Dolores Patty, MD;  Location: [facility] INVASIVE CV LAB;  Service: Cardiovascular;  Laterality: N/A;  . Cardiac catheterization N/A 07/13/2015    Procedure: Left Heart Cath and Cors/Grafts Angiography;  Surgeon: Marykay Lex, MD;  Location: [facility] INVASIVE CV LAB;  Service: Cardiovascular:  RI 100%, o-mRCA 100%, mLAD 60%, dLAD 70%, oD1 99%. LIMA-LAD patent, SVG-PRAV patent, SVG-RI patent, SVG-D1 ~50% dSVG. EDP 26 mHg  . Peripheral vascular catheterization N/A 07/13/2015    Procedure: Renal Angiography;  Surgeon: Marykay Lex, MD;  Location: [facility] INVASIVE CV LAB;  Service: Cardiovascular: Bilateral renal arteries are patent  . Back surgery    . Colonoscopy    . Abdominal hysterectomy    . Av fistula placement Left 09/02/2015    Procedure: RADIAL - CEPHALIC ARTERIOVENOUS (AV) FISTULA CREATION LEFT ARM;  Surgeon: Pryor Ochoa, MD;  Location: [facility] OR;  Service: Vascular;  Laterality: Left;  . Transthoracic echocardiogram  08/26/2015    EF 45-50% mild concentric Vertebrae. Anteroseptal hypokinesis. Pseudo-normal/grade 2 diastolic dysfunction. Moderate AI. Mild MR. Severe LA dilation. Mild RV dilation. Mild TR. Significant improvement in LVEF.   Family History  Problem Relation Age of Onset  . Breast cancer Mother   . Heart attack Father   . Cancer - Cervical Brother    Social History:  reports that she quit smoking about 22 years ago. Her smoking use included Cigarettes. She has  never used smokeless tobacco. She reports that she drinks about 4.2 oz of alcohol per week. She reports that she does not use illicit drugs. Allergies  Allergen Reactions  . Latex Swelling and Rash  . Statins Other (See Comments)    CLASS REACTION:  DIFFUSE CRAMPING Includes Lipitor, Crestor, Pravachol and simvastatin  . Angiotensin Receptor Blockers Itching and Rash  . Benicar [Olmesartan] Itching and Rash   Prior to Admission medications   Medication Sig Start Date End Date Taking? Authorizing Provider  acetaminophen (TYLENOL) 325 MG tablet Take 650 mg by mouth every 6 (six) hours as needed for moderate pain or fever.    Yes Historical Provider, MD  amiodarone (PACERONE) 200 MG tablet Take 1 tablet (200 mg total) by mouth daily. 400 mg daily for 1 week then decrease to 200 mg daily Patient taking differently: Take 200 mg by mouth daily.  08/14/15  Yes Beatrice Lecher, PA-C  betamethasone dipropionate (DIPROLENE) 0.05 %  cream Apply 1 application topically 2 (two) times daily as needed. ECZEMA   Yes Historical Provider, MD  Biotin 1000 MCG tablet Take 1,000 mcg by mouth daily.   Yes Historical Provider, MD  colchicine 0.6 MG tablet Take 0.6 mg by mouth 2 (two) times daily as needed. GOUT FLARE UP.   Yes Historical Provider, MD  febuxostat (ULORIC) 40 MG tablet Take 40 mg by mouth daily.   Yes Historical Provider, MD  hydrALAZINE (APRESOLINE) 25 MG tablet Take 1 tablet by mouth twice daily. You can take an extra 25mg  in the morning if your systolic BP is greater than 160. Patient taking differently: Take 25-50 mg by mouth See admin instructions. Take 1 tablet by mouth twice daily. You can take an extra 25mg  in the morning if your systolic BP is greater than 160. 11/13/15  Yes Marykay Lex, MD  LORazepam (ATIVAN) 0.5 MG tablet Take 0.5 mg by mouth 4 (four) times daily as needed for anxiety.   Yes Historical Provider, MD  Multiple Vitamins-Minerals (ICAPS) CAPS Take 1 capsule by mouth 2 (two)  times daily.   Yes Historical Provider, MD  multivitamin (RENA-VIT) TABS tablet Take 1 tablet by mouth daily.   Yes Historical Provider, MD  omeprazole (PRILOSEC) 20 MG capsule Take 20 mg by mouth daily.   Yes Historical Provider, MD  warfarin (COUMADIN) 2.5 MG tablet Take 1 tablet (2.5 mg total) by mouth daily. Take 1 tablet by mouth daily or as directed by coumadin clinic Patient taking differently: Take 2.5 mg by mouth daily. 2.5 mg on Sunday. 3.75 mg all other days 09/02/15  Yes Samantha J Rhyne, PA-C   Current Facility-Administered Medications  Medication Dose Route Frequency Provider Last Rate Last Dose  . acetaminophen (TYLENOL) tablet 650 mg  650 mg Oral Q6H PRN Alberteen Sam, MD   650 mg at 11/29/15 2200   Or  . acetaminophen (TYLENOL) suppository 650 mg  650 mg Rectal Q6H PRN Alberteen Sam, MD      . albuterol (PROVENTIL) (2.5 MG/3ML) 0.083% nebulizer solution 2.5 mg  2.5 mg Nebulization Q2H PRN Alberteen Sam, MD      . amiodarone (PACERONE) tablet 200 mg  200 mg Oral Daily Alberteen Sam, MD   200 mg at 11/30/15 1109  . ceFEPIme (MAXIPIME) 1 g in dextrose 5 % 50 mL IVPB  1 g Intravenous Q24H Alberteen Sam, MD   1 g at 11/29/15 2152  . febuxostat (ULORIC) tablet 40 mg  40 mg Oral Daily Alberteen Sam, MD   40 mg at 11/30/15 1109  . feeding supplement (NEPRO CARB STEADY) liquid 237 mL  237 mL Oral BID BM Alberteen Sam, MD   237 mL at 11/29/15 1000  . hydrALAZINE (APRESOLINE) tablet 25 mg  25 mg Oral BID Alberteen Sam, MD   25 mg at 11/30/15 1112  . LORazepam (ATIVAN) tablet 0.5 mg  0.5 mg Oral QID PRN Alberteen Sam, MD   0.5 mg at 11/29/15 2252  . multivitamin (RENA-VIT) tablet 1 tablet  1 tablet Oral Daily Alberteen Sam, MD   1 tablet at 11/30/15 1108  . ondansetron (ZOFRAN) tablet 4 mg  4 mg Oral Q6H PRN Alberteen Sam, MD       Or  . ondansetron (ZOFRAN) injection 4 mg  4 mg Intravenous Q6H PRN  Alberteen Sam, MD      . sodium chloride flush (NS) 0.9 % injection 3 mL  3 mL Intravenous Q12H Alberteen Sam, MD   3 mL at 11/29/15 2154  . [START ON 12/01/2015] vancomycin (VANCOCIN) 500 mg in sodium chloride 0.9 % 100 mL IVPB  500 mg Intravenous Q T,Th,Sa-HD Alberteen Sam, MD      . Warfarin - Pharmacist Dosing Inpatient   Does not apply [id number] Alberteen Sam, MD       Labs: Basic Metabolic Panel:  Recent Labs Lab 11/29/15 0013 11/29/15 0452 11/30/15 0320  NA 135 134* 131*  K 3.6 3.5 4.0  CL 98* 100* 97*  CO2 25 23 21*  GLUCOSE 87 79 77  BUN 25* 29* 49*  CREATININE 3.64* 3.76* 4.82*  CALCIUM 9.0 8.3* 8.4*   CBC:  Recent Labs Lab 11/29/15 0013 11/29/15 0452  WBC 22.0* 19.9*  HGB 12.4 10.2*  HCT 38.5 32.9*  MCV 102.9* 104.1*  PLT 269 229   Cardiac Enzymes:  Recent Labs Lab 11/29/15 0452  TROPONINI 0.08*   Studies/Results: Dg Chest 2 View  11/29/2015  CLINICAL DATA:  Shortness of breath, cough today. EXAM: CHEST  2 VIEW COMPARISON:  Chest radiograph 07/12/2015, chest CT 07/08/2015 FINDINGS: Left-sided dialysis catheter, tip at the atrial caval junction. Patient is post median sternotomy. Cardiomegaly is stable. Rounded left perihilar and right suprahilar opacities, new from prior exam. There are small pleural effusions. No pneumothorax. IMPRESSION: Bilateral perihilar opacities, differential considerations of multifocal pneumonia versus asymmetric pulmonary edema. Cardiomegaly and small pleural effusions, chronic. Electronically Signed   By: Rubye Oaks M.D.   On: 11/29/2015 01:02    ROS: As per HPI otherwise negative.   Physical Exam: Filed Vitals:   11/29/15 2001 11/30/15 0606 11/30/15 0847 11/30/15 1154  BP: 125/50 144/67 152/65 159/65  Pulse: 72 76 76 77  Temp: 98.3 F (36.8 C) 97.7 F (36.5 C) 98.6 F (37 C) 98.6 F (37 C)  TempSrc: Oral Oral Oral Oral  Resp: 18 18 18 18   Height:      Weight:  71.033 kg (156 lb 9.6  oz)    SpO2: 98% 99% 99% 99%     General: elderly WF in NAD playing cards Head: Normocephalic, atraumatic, sclera non-icteric, mucus membranes are moist Neck: Supple. JVD not elevated. Lungs: bilateral crackles Breathing is unlabored. Heart: RRR with frequent ectopy Abdomen: Soft, non-tender, non-distended with normoactive bowel sounds.. M-S:  Strength and tone appear normal for age. Lower extremities:without edema or ischemic changes, no open wounds  Neuro: Alert and oriented X 3. Moves all extremities spontaneously. Psych:  Responds to questions appropriately with a normal affect. Dialysis Access: left lower AVF + bruit - and left IJ (cath exit no drainage or erythema)  CXR - dense bilat infiltrates, L> R , perihilar, could be edema vs infectious  Dialysis Orders:  NW TTS 4 hr 160 400/800 EDW 70 2 K 2.25 Ca no profile left lower AVF and left IJ TDC 2100 heparin  No Fe, ESA or VDRA Recent labs: hgb 10.4 6/22 19% sat ferritin 435 - last Fe given 7/1 iPTH 110  Assessment/Plan: 1. SOB with fever and leukocytosis WBC 19.9 - CXR shows possible PNA and excess volume; Cultures have been drawn. On empiric Vanc and Maxipime; she is high risk for catheter related sepsis 2. ESRD -  MWF - K 3.5 HD today; AVF is cleared to be used 7/12 - will try to day with 17 gauge needles; evaluate in am to see if she needs another HD tmt - 3. Hypertension/volume  - BP  in usual range - may need EDW lowered slightly -has been getting to edw with low goals 4. Anemia  - s/o post recent short course of Fe hgb 10.2 consistent with outpt Hgb - not on ESA yet; follow and if hgb drops further will addd low dose 5. Metabolic bone disease -  No VDRA 6. Nutrition - renal diet/vits/nepro 7. AFib owith chronic anticoagulation - INR therapeutic 8. CAD s/p CABG 9.   Anxiety - tells me she takes one ativan an hour before HD and another half way through HD  Sheffield Slider, PA-C [facility]  [phone] 11/30/2015, 11:55 AM   Pt seen, examined and agree w A/P as above.  Vinson Moselle MD [employer] pager [phone]    cell [phone] 11/30/2015, 2:24 PM

## 2015-11-30 NOTE — Procedures (Signed)
  I was present at this dialysis session, have reviewed the session itself and made  appropriate changes Vinson Moselle MD [facility] pager [phone]    cell [phone] 11/30/2015, 2:26 PM

## 2015-12-01 LAB — URINE CULTURE: CULTURE: NO GROWTH

## 2015-12-01 LAB — BASIC METABOLIC PANEL
Anion gap: 12 (ref 5–15)
BUN: 26 mg/dL — AB (ref 6–20)
CALCIUM: 8.3 mg/dL — AB (ref 8.9–10.3)
CO2: 25 mmol/L (ref 22–32)
CREATININE: 2.86 mg/dL — AB (ref 0.44–1.00)
Chloride: 97 mmol/L — ABNORMAL LOW (ref 101–111)
GFR, EST AFRICAN AMERICAN: 17 mL/min — AB (ref >60)
GFR, EST NON AFRICAN AMERICAN: 15 mL/min — AB (ref >60)
Glucose, Bld: 83 mg/dL (ref 65–99)
Potassium: 3.5 mmol/L (ref 3.5–5.1)
SODIUM: 134 mmol/L — AB (ref 135–145)

## 2015-12-01 LAB — CULTURE, BLOOD (ROUTINE X 2)

## 2015-12-01 LAB — PROTIME-INR
INR: 2 — AB (ref 0.00–1.49)
PROTHROMBIN TIME: 22.6 s — AB (ref 11.6–15.2)

## 2015-12-01 LAB — LEGIONELLA PNEUMOPHILA SEROGP 1 UR AG: L. pneumophila Serogp 1 Ur Ag: NEGATIVE

## 2015-12-01 LAB — PROCALCITONIN: PROCALCITONIN: 14.57 ng/mL

## 2015-12-01 MED ORDER — LIDOCAINE HCL (PF) 1 % IJ SOLN: 5.0000 mL | INTRAMUSCULAR | Status: DC | PRN

## 2015-12-01 MED ORDER — WARFARIN SODIUM 7.5 MG PO TABS
3.7500 mg | ORAL_TABLET | Freq: Once | ORAL | Status: AC
  Administered 2015-12-01: 3.75 mg via ORAL
  Filled 2015-12-01: qty 0.5

## 2015-12-01 MED ORDER — PENTAFLUOROPROP-TETRAFLUOROETH EX AERO: 1.0000 "application " | INHALATION_SPRAY | CUTANEOUS | Status: DC | PRN

## 2015-12-01 MED ORDER — ALTEPLASE 2 MG IJ SOLR: 2.0000 mg | Freq: Once | INTRAMUSCULAR | Status: DC | PRN

## 2015-12-01 MED ORDER — SODIUM CHLORIDE 0.9 % IV SOLN: 100.0000 mL | INTRAVENOUS | Status: DC | PRN

## 2015-12-01 MED ORDER — LIDOCAINE-PRILOCAINE 2.5-2.5 % EX CREA
1.0000 "application " | TOPICAL_CREAM | CUTANEOUS | Status: DC | PRN
  Filled 2015-12-01: qty 5

## 2015-12-01 MED ORDER — HEPARIN SODIUM (PORCINE) 1000 UNIT/ML DIALYSIS: 2000.0000 [IU] | Freq: Once | INTRAMUSCULAR | Status: DC

## 2015-12-01 MED ORDER — HEPARIN SODIUM (PORCINE) 1000 UNIT/ML DIALYSIS: 1000.0000 [IU] | INTRAMUSCULAR | Status: DC | PRN

## 2015-12-01 NOTE — Progress Notes (Signed)
PROGRESS NOTE  [name]  NLG:[id number] DOB: [birth year] DOA: 11/28/2015 PCP: Michiel Sites, MD Outpatient Specialists:  Subjective: Seen with her daughter at bedside, no complaints this morning. Repeated blood cultures, gram-positive bacteremia need to rule out persistence of infection/HD catheter infection.  Brief Narrative:  80 year old female with history of a ESRD and chronic systolic CHF came in with symptoms of pneumonia and mildly septic.  Assessment & Plan:   Principal Problem:   Sepsis (HCC) Active Problems:   CAD in native artery - s/p CABG 4   PAD (peripheral artery disease) (HCC)   Hypertension, essential   Persistent atrial fibrillation (HCC)   End stage renal disease (HCC)   HCAP (healthcare-associated pneumonia)   Sepsis -Presented with tachycardia, tachypnea, fever in the presence of pneumonia, meets sepsis criteria on admission. -Lactate is 2.1 indicating endorgan damage. -Treated with broad-spectrum antibiotics, no IV fluids because of ESRD. -Sepsis is secondary to pneumonia and septicemia  Streptococcus pneumoniae bacteremia -Likely secondary to pneumonia, blood cultures 1 out of 2 showed Streptococcus pneumoniae. -Started on broad-spectrum antibiotics, will switch to 2 g of Rocephin. -Blood cultures repeated today, if it's negative might be discharged in the morning. If it's positive need to reassess HD catheter.  Pneumonia, HCAP -CXR showed multifocal pneumonia, patient was on Zosyn and vancomycin treat as HCAP. -Has a Streptococcus bacteremia and urine is positive for Streptococcus antigen, will switch antibiotic to 2 g of Rocephin. -Continue supportive management with bronchodilators, mucolytics, antitussives and oxygen as needed.  ESRD:  -Consult to Nephrology for maintenance HD, appreciate cares -Continue renavite  Elevated troponin:  Suspect low clearance in ESRD patient -Cycle enzymes  Chronic systolic CHF:  EF 45-50%  earlier this year, moderate AR, grade 2 diastolic dysfunction. -Continue hydralazine -Not on furosemide  HTN, CAD s/p CABG and peripheral vascular disease s/p fempop bypass:  -Continue hydralazine with hold parameters  pAF:  On amiodarone. CHADS2-VASc 7. On warfarin. -Continue amiodarone -Continue warfarin  Gout:  -Continue Uloric   DVT prophylaxis:  Code Status: DNR Family Communication:  Disposition Plan:  Diet: Diet renal with fluid restriction Fluid restriction:: 1200 mL Fluid; Room service appropriate?: Yes; Fluid consistency:: Thin  Consultants:   Nephrology  Procedures:   None  Antimicrobials:   Cefepime and vancomycin  Objective: Filed Vitals:   11/30/15 1730 11/30/15 1958 12/01/15 0558 12/01/15 1212  BP: 140/80 121/51 128/50 153/57  Pulse: 84 92 67 72  Temp: 98 F (36.7 C) 98.5 F (36.9 C) 98.7 F (37.1 C) 98.2 F (36.8 C)  TempSrc: Oral Oral Oral Oral  Resp: 20 20 20 21   Height:      Weight: 69 kg (152 lb 1.9 oz)  69.536 kg (153 lb 4.8 oz)   SpO2: 99% 96% 97% 92%    Intake/Output Summary (Last 24 hours) at 12/01/15 1447 Last data filed at 12/01/15 0956  Gross per 24 hour  Intake    480 ml  Output   2240 ml  Net  -1760 ml   Filed Weights   11/30/15 1330 11/30/15 1730 12/01/15 0558  Weight: 71 kg (156 lb 8.4 oz) 69 kg (152 lb 1.9 oz) 69.536 kg (153 lb 4.8 oz)    Examination: General exam: Appears calm and comfortable  Respiratory system: Clear to auscultation. Respiratory effort normal. Cardiovascular system: S1 & S2 heard, RRR. No JVD, murmurs, rubs, gallops or clicks. No pedal edema. Gastrointestinal system: Abdomen is nondistended, soft and nontender. No organomegaly or masses felt. Normal bowel sounds heard. Central nervous  system: Alert and oriented. No focal neurological deficits. Extremities: Symmetric 5 x 5 power. Skin: No rashes, lesions or ulcers Psychiatry: Judgement and insight appear normal. Mood & affect appropriate.    Data Reviewed: I have personally reviewed following labs and imaging studies  CBC:  Recent Labs Lab 11/29/15 0013 11/29/15 0452  WBC 22.0* 19.9*  HGB 12.4 10.2*  HCT 38.5 32.9*  MCV 102.9* 104.1*  PLT 269 229   Basic Metabolic Panel:  Recent Labs Lab 11/29/15 0013 11/29/15 0452 11/30/15 0320 12/01/15 0522  NA 135 134* 131* 134*  K 3.6 3.5 4.0 3.5  CL 98* 100* 97* 97*  CO2 25 23 21* 25  GLUCOSE 87 79 77 83  BUN 25* 29* 49* 26*  CREATININE 3.64* 3.76* 4.82* 2.86*  CALCIUM 9.0 8.3* 8.4* 8.3*   GFR: Estimated Creatinine Clearance: 15.2 mL/min (by C-G formula based on Cr of 2.86). Liver Function Tests: No results for input(s): AST, ALT, ALKPHOS, BILITOT, PROT, ALBUMIN in the last 168 hours. No results for input(s): LIPASE, AMYLASE in the last 168 hours. No results for input(s): AMMONIA in the last 168 hours. Coagulation Profile:  Recent Labs Lab 11/29/15 0452 11/30/15 0320 12/01/15 0522  INR 2.14* 3.14* 2.00*   Cardiac Enzymes:  Recent Labs Lab 11/29/15 0452  TROPONINI 0.08*   BNP (last 3 results) No results for input(s): PROBNP in the last 8760 hours. HbA1C: No results for input(s): HGBA1C in the last 72 hours. CBG: No results for input(s): GLUCAP in the last 168 hours. Lipid Profile: No results for input(s): CHOL, HDL, LDLCALC, TRIG, CHOLHDL, LDLDIRECT in the last 72 hours. Thyroid Function Tests: No results for input(s): TSH, T4TOTAL, FREET4, T3FREE, THYROIDAB in the last 72 hours. Anemia Panel: No results for input(s): VITAMINB12, FOLATE, FERRITIN, TIBC, IRON, RETICCTPCT in the last 72 hours. Urine analysis:    Component Value Date/Time   COLORURINE AMBER* 11/29/2015 1559   APPEARANCEUR TURBID* 11/29/2015 1559   LABSPEC 1.027 11/29/2015 1559   PHURINE 5.0 11/29/2015 1559   GLUCOSEU NEGATIVE 11/29/2015 1559   HGBUR NEGATIVE 11/29/2015 1559   BILIRUBINUR SMALL* 11/29/2015 1559   KETONESUR 15* 11/29/2015 1559   PROTEINUR 100* 11/29/2015 1559    NITRITE NEGATIVE 11/29/2015 1559   LEUKOCYTESUR LARGE* 11/29/2015 1559   Sepsis Labs: (procalcitonin:4,lacticidven:4)  ) Recent Results (from the past 240 hour(s))  Culture, blood (routine x 2)     Status: Abnormal   Collection Time: 11/29/15 12:13 AM  Result Value Ref Range Status   Specimen Description BLOOD RIGHT ANTECUBITAL  Final   Special Requests BOTTLES DRAWN AEROBIC AND ANAEROBIC 5CC   Final   Culture  Setup Time   Final    GRAM POSITIVE COCCI IN PAIRS AND CHAINS IN BOTH AEROBIC AND ANAEROBIC BOTTLES CRITICAL RESULT CALLED TO, READ BACK BY AND VERIFIED WITH: J MARKLE,PHARMD AT 1525 11/29/15 BY L BENFIELD    Culture STREPTOCOCCUS PNEUMONIAE (A)  Final   Report Status 12/01/2015 FINAL  Final   Organism ID, Bacteria STREPTOCOCCUS PNEUMONIAE  Final      Susceptibility   Streptococcus pneumoniae - MIC*    ERYTHROMYCIN >=8 RESISTANT Resistant     LEVOFLOXACIN 0.5 SENSITIVE Sensitive     PENICILLIN <=0.06      CEFTRIAXONE <=0.12 SENSITIVE Sensitive     * STREPTOCOCCUS PNEUMONIAE  Blood Culture ID Panel (Reflexed)     Status: Abnormal   Collection Time: 11/29/15 12:13 AM  Result Value Ref Range Status   Enterococcus species NOT DETECTED NOT DETECTED  Final   Vancomycin resistance NOT DETECTED NOT DETECTED Final   Listeria monocytogenes NOT DETECTED NOT DETECTED Final   Staphylococcus species NOT DETECTED NOT DETECTED Final   Staphylococcus aureus NOT DETECTED NOT DETECTED Final   Methicillin resistance NOT DETECTED NOT DETECTED Final   Streptococcus species DETECTED (A) NOT DETECTED Final    Comment: CRITICAL RESULT CALLED TO, READ BACK BY AND VERIFIED WITH: J MARKLE,PHARMD AT 1525 11/29/15 BY L BENFIELD    Streptococcus agalactiae NOT DETECTED NOT DETECTED Final   Streptococcus pneumoniae DETECTED (A) NOT DETECTED Final    Comment: CRITICAL RESULT CALLED TO, READ BACK BY AND VERIFIED WITH: J MARKLE,PHARMD AT 1525 11/29/15 BY L BENFIELD    Streptococcus pyogenes  NOT DETECTED NOT DETECTED Final   Acinetobacter baumannii NOT DETECTED NOT DETECTED Final   Enterobacteriaceae species NOT DETECTED NOT DETECTED Final   Enterobacter cloacae complex NOT DETECTED NOT DETECTED Final   Escherichia coli NOT DETECTED NOT DETECTED Final   Klebsiella oxytoca NOT DETECTED NOT DETECTED Final   Klebsiella pneumoniae NOT DETECTED NOT DETECTED Final   Proteus species NOT DETECTED NOT DETECTED Final   Serratia marcescens NOT DETECTED NOT DETECTED Final   Carbapenem resistance NOT DETECTED NOT DETECTED Final   Haemophilus influenzae NOT DETECTED NOT DETECTED Final   Neisseria meningitidis NOT DETECTED NOT DETECTED Final   Pseudomonas aeruginosa NOT DETECTED NOT DETECTED Final   Candida albicans NOT DETECTED NOT DETECTED Final   Candida glabrata NOT DETECTED NOT DETECTED Final   Candida krusei NOT DETECTED NOT DETECTED Final   Candida parapsilosis NOT DETECTED NOT DETECTED Final   Candida tropicalis NOT DETECTED NOT DETECTED Final  Culture, blood (routine x 2)     Status: None (Preliminary result)   Collection Time: 11/29/15  2:05 AM  Result Value Ref Range Status   Specimen Description BLOOD RIGHT HAND  Final   Special Requests IN PEDIATRIC BOTTLE 3CC  Final   Culture NO GROWTH 2 DAYS  Final   Report Status PENDING  Incomplete  Urine culture     Status: None   Collection Time: 11/29/15 10:12 PM  Result Value Ref Range Status   Specimen Description URINE, CLEAN CATCH  Final   Special Requests NONE  Final   Culture NO GROWTH  Final   Report Status 12/01/2015 FINAL  Final     Invalid input(s): PROCALCITONIN, LACTICACIDVEN   Radiology Studies: No results found.      Scheduled Meds: . amiodarone  200 mg Oral Daily  . cefTRIAXone (ROCEPHIN)  IV  2 g Intravenous Q24H  . febuxostat  40 mg Oral Daily  . feeding supplement (NEPRO CARB STEADY)  237 mL Oral BID BM  . hydrALAZINE  25 mg Oral BID  . multivitamin  1 tablet Oral Daily  . sodium chloride flush  3  mL Intravenous Q12H  . warfarin  3.75 mg Oral ONCE-1800  . Warfarin - Pharmacist Dosing Inpatient   Does not apply q1800   Continuous Infusions:    LOS: 2 days    Time spent: 35 minutes    Jaber Dunlow A, MD [facility] Pager [phone]  If 7PM-7AM, please contact night-coverage [email or web address] Password [facility] 12/01/2015, 2:47 PM

## 2015-12-01 NOTE — Progress Notes (Signed)
  [facility] Progress Note   Subjective: grew strep PNA from blood cx  Filed Vitals:   11/30/15 1700 11/30/15 1730 11/30/15 1958 12/01/15 0558  BP: 140/84 140/80 121/51 128/50  Pulse: 88 84 92 67  Temp:  98 F (36.7 C) 98.5 F (36.9 C) 98.7 F (37.1 C)  TempSrc:  Oral Oral Oral  Resp: 20 20 20 20   Height:      Weight:  69 kg (152 lb 1.9 oz)  69.536 kg (153 lb 4.8 oz)  SpO2:  99% 96% 97%    Inpatient medications: . amiodarone  200 mg Oral Daily  . cefTRIAXone (ROCEPHIN)  IV  2 g Intravenous Q24H  . febuxostat  40 mg Oral Daily  . feeding supplement (NEPRO CARB STEADY)  237 mL Oral BID BM  . hydrALAZINE  25 mg Oral BID  . multivitamin  1 tablet Oral Daily  . sodium chloride flush  3 mL Intravenous Q12H  . warfarin  3.75 mg Oral ONCE-1800  . Warfarin - Pharmacist Dosing Inpatient   Does not apply q1800     acetaminophen **OR** acetaminophen, albuterol, LORazepam, ondansetron **OR** ondansetron (ZOFRAN) IV  Exam: Alert, looks much better No jvd Chest CTA bilat RRR no mrg Abd soft ntnd obese Ext no LE edema Lt forearm AVF+bruit/ L IJ cath  Dialysis: [facility] 4 hr 160 400/800 EDW 70 2 K 2.25 Ca no profile left lower AVF and left IJ TDC 2100 heparin No Fe, ESA or VDRA Recent labs: hgb 10.4 6/22 19% sat ferritin 435 - last Fe given 7/1 iPTH 110  Assessment/Plan: 1. Strep PNA - per primary 2. ESRD -short HD today to get back on schedule 3. Hypertension/volume - lower dry wt slightly 4. Anemia - s/o post recent short course of Fe hgb 10.2 consistent with outpt Hgb - not on ESA yet; follow and if hgb drops further will addd low dose 5. Metabolic bone disease - No VDRA 6. Nutrition - renal diet/vits/nepro 7. AFib owith chronic anticoagulation - INR therapeutic 8. CAD s/p CABG 9. Anxiety - she takes one ativan an hour before HD and another half way through HD 10. Dispo - per primary. Stable from renal standpoint.   Plan - HD today, abx   Vinson Moselle MD [facility] pager [phone]    cell [phone] 12/01/2015, 12:01 PM    Recent Labs Lab 11/29/15 0452 11/30/15 0320 12/01/15 0522  NA 134* 131* 134*  K 3.5 4.0 3.5  CL 100* 97* 97*  CO2 23 21* 25  GLUCOSE 79 77 83  BUN 29* 49* 26*  CREATININE 3.76* 4.82* 2.86*  CALCIUM 8.3* 8.4* 8.3*   No results for input(s): AST, ALT, ALKPHOS, BILITOT, PROT, ALBUMIN in the last 168 hours.  Recent Labs Lab 11/29/15 0013 11/29/15 0452  WBC 22.0* 19.9*  HGB 12.4 10.2*  HCT 38.5 32.9*  MCV 102.9* 104.1*  PLT 269 229   Iron/TIBC/Ferritin/ %Sat    Component Value Date/Time   IRON 18* 07/16/2015 0755   TIBC 308 07/16/2015 0755   FERRITIN 85 07/16/2015 0755   IRONPCTSAT 6* 07/16/2015 0755

## 2015-12-01 NOTE — Progress Notes (Signed)
ANTICOAGULATION CONSULT NOTE - Follow Up Consult  Pharmacy Consult for Coumadin Indication: atrial fibrillation  Allergies  Allergen Reactions  . Latex Swelling and Rash  . Statins Other (See Comments)    CLASS REACTION:  DIFFUSE CRAMPING Includes Lipitor, Crestor, Pravachol and simvastatin  . Angiotensin Receptor Blockers Itching and Rash  . Benicar [Olmesartan] Itching and Rash    Patient Measurements: Height: 5\' 3"  (160 cm) Weight: 153 lb 4.8 oz (69.536 kg) (scale c) IBW/kg (Calculated) : 52.4  Vital Signs: Temp: 98.7 F (37.1 C) (07/11 0558) Temp Source: Oral (07/11 0558) BP: 128/50 mmHg (07/11 0558) Pulse Rate: 67 (07/11 0558)  Labs:  Recent Labs  11/29/15 0013 11/29/15 0452 11/30/15 0320 12/01/15 0522  HGB 12.4 10.2*  --   --   HCT 38.5 32.9*  --   --   PLT 269 229  --   --   LABPROT  --  23.8* 31.7* 22.6*  INR  --  2.14* 3.14* 2.00*  CREATININE 3.64* 3.76* 4.82* 2.86*  TROPONINI  --  0.08*  --   --     Estimated Creatinine Clearance: 15.2 mL/min (by C-G formula based on Cr of 2.86).  Anticoagulation   Admitted with cough/fever, poss sepsis/PNA, for Vancomycin and Cefepime  Usually TTSat HD, but was off schedule last week due to [facility], and last HD was Friday 7/7.    Anticoagulation  Coumadin for Afib INR 2.14>3.14>2 overnight. CHADS2-VASc 7. Anemia of chronic dz. --Coumadin 3.75 daily except 2.5 mg Sundays (per clinic note 7/3)  Goal of Therapy:  INR 2-3 Monitor platelets by anticoagulation protocol: Yes   Plan:  Coumadin 3.75mg  today Daily INR   Tieler Cournoyer S. Merilynn Finland, PharmD, BCPS Clinical Staff Pharmacist Pager [phone]  Misty Stanley Stillinger 12/01/2015,10:46 AM

## 2015-12-01 NOTE — Progress Notes (Signed)
Pt now in Sinus Rhythm. Will continue to monitor.

## 2015-12-02 LAB — BASIC METABOLIC PANEL
ANION GAP: 9 (ref 5–15)
BUN: 17 mg/dL (ref 6–20)
CALCIUM: 8.2 mg/dL — AB (ref 8.9–10.3)
CHLORIDE: 99 mmol/L — AB (ref 101–111)
CO2: 27 mmol/L (ref 22–32)
CREATININE: 2.18 mg/dL — AB (ref 0.44–1.00)
GFR calc non Af Amer: 20 mL/min — ABNORMAL LOW (ref >60)
GFR, EST AFRICAN AMERICAN: 24 mL/min — AB (ref >60)
GLUCOSE: 103 mg/dL — AB (ref 65–99)
Potassium: 3.8 mmol/L (ref 3.5–5.1)
Sodium: 135 mmol/L (ref 135–145)

## 2015-12-02 LAB — PROTIME-INR
INR: 1.67 — AB (ref 0.00–1.49)
Prothrombin Time: 19.7 seconds — ABNORMAL HIGH (ref 11.6–15.2)

## 2015-12-02 MED ORDER — WARFARIN SODIUM 5 MG PO TABS
5.0000 mg | ORAL_TABLET | Freq: Once | ORAL | Status: AC
  Administered 2015-12-02: 5 mg via ORAL
  Filled 2015-12-02: qty 1

## 2015-12-02 MED ORDER — TRAMADOL HCL 50 MG PO TABS
50.0000 mg | ORAL_TABLET | Freq: Once | ORAL | Status: AC
  Administered 2015-12-02: 50 mg via ORAL
  Filled 2015-12-02: qty 1

## 2015-12-02 MED ORDER — OXYCODONE HCL 5 MG PO TABS
5.0000 mg | ORAL_TABLET | Freq: Four times a day (QID) | ORAL | Status: DC | PRN
Start: 2015-12-02 — End: 2015-12-03
  Administered 2015-12-02 (×2): 5 mg via ORAL
  Filled 2015-12-02 (×2): qty 1

## 2015-12-02 NOTE — Progress Notes (Signed)
Refused bed alarm. Will continue to monitor patient. 

## 2015-12-02 NOTE — Progress Notes (Signed)
[facility] Progress Note  Assessment/Plan: 1. Strep PNA HCAP w bacteremia- per primary - repeat surveillance BC pending 2. ESRD -TTS - infiltrated AVF Monday (first use) - rested on Tuesday - plan try one needle in AVF Thursday with skilled cannulator 3. Hypertension/volume -  net UF 2 L Monday and Tuesday - with Tuesday post wt 68.4 (EDW 70) - lower edw for d/c- titrate down a little further - 4. Anemia - s/o post recent short course of Fe hgb 10.2 7/9 consistent with outpt Hgb - not on ESA yet; follow and if hgb drops further will addd low dose- needs recheck Thursday  5. Metabolic bone disease - No VDRA 6. Nutrition - renal diet/vits/nepro 7. AFib owith chronic anticoagulation - INR therapeutic 8. CAD s/p CABG 9.       Anxiety - she takes one ativan an hour before HD and another half way through HD 10.       Dispo - per primary.  11.       Back pain - intermittent - follow for now  Sheffield Slider, PA-C [facility] Beeper [phone] 12/02/2015,12:48 PM  LOS: 3 days   Pt seen, examined and agree w A/P as above.  Vinson Moselle MD [facility] pager [phone]    cell [phone] 12/02/2015, 3:50 PM    Subjective:   Some back pain last night - intermittent more lateral L-T area. No N, V or D  Objective Filed Vitals:   12/01/15 1730 12/01/15 1755 12/01/15 2007 12/02/15 0419  BP: 147/72 156/70 140/58 157/53  Pulse: 72 72 67 68  Temp:  99.2 F (37.3 C) 98.9 F (37.2 C) 98 F (36.7 C)  TempSrc:  Oral Oral Oral  Resp: 23 16 18 19   Height:      Weight:  68.4 kg (150 lb 12.7 oz)  68.584 kg (151 lb 3.2 oz)  SpO2:   98% 96%   Physical Exam General: NAD eating lunch Heart: RRR Lungs: no rales Abdomen: soft NT Back: slight lateral left lumbar/thoracic tenderness on palpation Extremities: no LE edema Dialysis Access: left lower AVF + bruit- some bruising and left IJ  Dialysis Orders: [facility] 4 hr 160 400/800 EDW 70 2 K 2.25 Ca  no profile left lower AVF and left IJ TDC 2100 heparin No Fe, ESA or VDRA Recent labs: hgb 10.4 6/22 19% sat ferritin 435 - last Fe given 7/1 iPTH 110  Additional Objective Labs: Basic Metabolic Panel:  Recent Labs Lab 11/30/15 0320 12/01/15 0522 12/02/15 0232  NA 131* 134* 135  K 4.0 3.5 3.8  CL 97* 97* 99*  CO2 21* 25 27  GLUCOSE 77 83 103*  BUN 49* 26* 17  CREATININE 4.82* 2.86* 2.18*  CALCIUM 8.4* 8.3* 8.2*   CBC:  Recent Labs Lab 11/29/15 0013 11/29/15 0452  WBC 22.0* 19.9*  HGB 12.4 10.2*  HCT 38.5 32.9*  MCV 102.9* 104.1*  PLT 269 229   Blood Culture    Component Value Date/Time   SDES URINE, CLEAN CATCH 11/29/2015 2212   SPECREQUEST NONE 11/29/2015 2212   CULT NO GROWTH 11/29/2015 2212   REPTSTATUS 12/01/2015 FINAL 11/29/2015 2212    Cardiac Enzymes:  Recent Labs Lab 11/29/15 0452  TROPONINI 0.08*    Lab Results  Component Value Date   INR 1.67* 12/02/2015   INR 2.00* 12/01/2015   INR 3.14* 11/30/2015   Studies/Results: No results found. Medications:   . amiodarone  200 mg Oral Daily  .  cefTRIAXone (ROCEPHIN)  IV  2 g Intravenous Q24H  . febuxostat  40 mg Oral Daily  . feeding supplement (NEPRO CARB STEADY)  237 mL Oral BID BM  . heparin  2,000 Units Dialysis Once in dialysis  . hydrALAZINE  25 mg Oral BID  . multivitamin  1 tablet Oral Daily  . sodium chloride flush  3 mL Intravenous Q12H  . warfarin  5 mg Oral ONCE-1800  . Warfarin - Pharmacist Dosing Inpatient   Does not apply [phone]

## 2015-12-02 NOTE — Discharge Instructions (Signed)
Information on my medicine - Coumadin®   (Warfarin) ° °This medication education was reviewed with me or my healthcare representative as part of my discharge preparation.  The pharmacist that spoke with me during my hospital stay was:  Dellis Voght Stillinger, RPH ° °Why was Coumadin prescribed for you? °Coumadin was prescribed for you because you have a blood clot or a medical condition that can cause an increased risk of forming blood clots. Blood clots can cause serious health problems by blocking the flow of blood to the heart, lung, or brain. Coumadin can prevent harmful blood clots from forming. °As a reminder your indication for Coumadin is:   Stroke Prevention Because Of Atrial Fibrillation ° °What test will check on my response to Coumadin? °While on Coumadin (warfarin) you will need to have an INR test regularly to ensure that your dose is keeping you in the desired range. The INR (international normalized ratio) number is calculated from the result of the laboratory test called prothrombin time (PT). ° °If an INR APPOINTMENT HAS NOT ALREADY BEEN MADE FOR YOU please schedule an appointment to have this lab work done by your health care provider within 7 days. °Your INR goal is usually a number between:  2 to 3 or your provider may give you a more narrow range like 2-2.5.  Ask your health care provider during an office visit what your goal INR is. ° °What  do you need to  know  About  COUMADIN? °Take Coumadin (warfarin) exactly as prescribed by your healthcare provider about the same time each day.  DO NOT stop taking without talking to the doctor who prescribed the medication.  Stopping without other blood clot prevention medication to take the place of Coumadin may increase your risk of developing a new clot or stroke.  Get refills before you run out. ° °What do you do if you miss a dose? °If you miss a dose, take it as soon as you remember on the same day then continue your regularly scheduled  regimen the next day.  Do not take two doses of Coumadin at the same time. ° °Important Safety Information °A possible side effect of Coumadin (Warfarin) is an increased risk of bleeding. You should call your healthcare provider right away if you experience any of the following: °  Bleeding from an injury or your nose that does not stop. °  Unusual colored urine (red or dark brown) or unusual colored stools (red or black). °  Unusual bruising for unknown reasons. °  A serious fall or if you hit your head (even if there is no bleeding). ° °Some foods or medicines interact with Coumadin® (warfarin) and might alter your response to warfarin. To help avoid this: °  Eat a balanced diet, maintaining a consistent amount of Vitamin K. °  Notify your provider about major diet changes you plan to make. °  Avoid alcohol or limit your intake to 1 drink for women and 2 drinks for men per day. °(1 drink is 5 oz. wine, 12 oz. beer, or 1.5 oz. liquor.) ° °Make sure that ANY health care provider who prescribes medication for you knows that you are taking Coumadin (warfarin).  Also make sure the healthcare provider who is monitoring your Coumadin knows when you have started a new medication including herbals and non-prescription products. ° °Coumadin® (Warfarin)  Major Drug Interactions  °Increased Warfarin Effect Decreased Warfarin Effect  °Alcohol (large quantities) °Antibiotics (esp. Septra/Bactrim, Flagyl, Cipro) °Amiodarone (Cordarone) °Aspirin (  ASA) °Cimetidine (Tagamet) °Megestrol (Megace) °NSAIDs (ibuprofen, naproxen, etc.) °Piroxicam (Feldene) °Propafenone (Rythmol SR) °Propranolol (Inderal) °Isoniazid (INH) °Posaconazole (Noxafil) Barbiturates (Phenobarbital) °Carbamazepine (Tegretol) °Chlordiazepoxide (Librium) °Cholestyramine (Questran) °Griseofulvin °Oral Contraceptives °Rifampin °Sucralfate (Carafate) °Vitamin K  ° °Coumadin® (Warfarin) Major Herbal Interactions  °Increased Warfarin Effect Decreased Warfarin Effect    °Garlic °Ginseng °Ginkgo biloba Coenzyme Q10 °Green tea °St. John’s wort   ° °Coumadin® (Warfarin) FOOD Interactions  °Eat a consistent number of servings per week of foods HIGH in Vitamin K °(1 serving = ½ cup)  °Collards (cooked, or boiled & drained) °Kale (cooked, or boiled & drained) °Mustard greens (cooked, or boiled & drained) °Parsley *serving size only = ¼ cup °Spinach (cooked, or boiled & drained) °Swiss chard (cooked, or boiled & drained) °Turnip greens (cooked, or boiled & drained)  °Eat a consistent number of servings per week of foods MEDIUM-HIGH in Vitamin K °(1 serving = 1 cup)  °Asparagus (cooked, or boiled & drained) °Broccoli (cooked, boiled & drained, or raw & chopped) °Brussel sprouts (cooked, or boiled & drained) *serving size only = ½ cup °Lettuce, raw (green leaf, endive, romaine) °Spinach, raw °Turnip greens, raw & chopped  ° °These websites have more information on Coumadin (warfarin):  www.coumadin.com; °www.ahrq.gov/consumer/coumadin.htm; ° ° ° °

## 2015-12-02 NOTE — Progress Notes (Signed)
ANTICOAGULATION CONSULT NOTE - Follow Up Consult  Pharmacy Consult for Coumadin Indication: atrial fibrillation  Allergies  Allergen Reactions  . Latex Swelling and Rash  . Statins Other (See Comments)    CLASS REACTION:  DIFFUSE CRAMPING Includes Lipitor, Crestor, Pravachol and simvastatin  . Angiotensin Receptor Blockers Itching and Rash  . Benicar [Olmesartan] Itching and Rash    Patient Measurements: Height: 5\' 3"  (160 cm) Weight: 151 lb 3.2 oz (68.584 kg) IBW/kg (Calculated) : 52.4  Vital Signs: Temp: 98 F (36.7 C) (07/12 0419) Temp Source: Oral (07/12 0419) BP: 157/53 mmHg (07/12 0419) Pulse Rate: 68 (07/12 0419)  Labs:  Recent Labs  11/30/15 0320 12/01/15 0522 12/02/15 0232  LABPROT 31.7* 22.6* 19.7*  INR 3.14* 2.00* 1.67*  CREATININE 4.82* 2.86* 2.18*    Estimated Creatinine Clearance: 19.8 mL/min (by C-G formula based on Cr of 2.18).  Anticoagulation   Admitted with cough/fever, poss sepsis/PNA, for Vancomycin and Cefepime  Usually TTSat HD, but was off schedule last week due to [facility], and last HD was Friday 7/7.    Anticoagulation: Coumadin for Afib INR 2.14>3.14 (dose held)>2>1.67 overnight. CHADS2-VASc 7. Anemia of chronic dz --Coumadin 3.75 daily except 2.5 mg Sundays (per clinic note 7/3)  Goal of Therapy:  INR 2-3 Monitor platelets by anticoagulation protocol: Yes   Plan:  Coumadin 5 mg today Daily INR   Karl Knarr S. Merilynn Finland, PharmD, BCPS Clinical Staff Pharmacist Pager [phone]  Misty Stanley Stillinger 12/02/2015,8:19 AM

## 2015-12-02 NOTE — Progress Notes (Signed)
PROGRESS NOTE  [name]  ZOX:[id number] DOB: Sep 27, 1935 DOA: 11/28/2015 PCP: Michiel Sites, MD Outpatient Specialists:  Subjective: Reports back pain. Asking for more opiate. Tells me that Tramadol helped her back pain. Seen alongside patient's Husband and Daughter. Repeated blood cultures are still pending. No other constitutional symptoms reported.  Brief Narrative:  80 year old female with history of a ESRD and chronic systolic CHF came in with symptoms of pneumonia and mildly septic.  Assessment & Plan:   Principal Problem:   Sepsis (HCC) Active Problems:   CAD in native artery - s/p CABG 4   PAD (peripheral artery disease) (HCC)   Hypertension, essential   Persistent atrial fibrillation (HCC)   End stage renal disease (HCC)   HCAP (healthcare-associated pneumonia)   Sepsis - Continue antibiotics. Follow repeat cultures. If back pain persists, consider MRI of the back to rule out diskitis. -Presented with tachycardia, tachypnea, fever in the presence of pneumonia, meets sepsis criteria on admission. -Lactate is 2.1 indicating endorgan damage. -Treated with broad-spectrum antibiotics, no IV fluids because of ESRD. -Sepsis is secondary to pneumonia and septicemia  Streptococcus pneumoniae bacteremia - See above. -Likely secondary to pneumonia, blood cultures 1 out of 2 showed Streptococcus pneumoniae. -Started on broad-spectrum antibiotics, will switch to 2 g of Rocephin. -Blood cultures repeated today, if it's negative might be discharged in the morning. If it's positive need to reassess HD catheter.  Pneumonia, HCAP -CXR showed multifocal pneumonia, patient was on Zosyn and vancomycin treat as HCAP. -Has a Streptococcus bacteremia and urine is positive for Streptococcus antigen, will switch antibiotic to 2 g of Rocephin. -Continue supportive management with bronchodilators, mucolytics, antitussives and oxygen as needed.  ESRD:  -Consult to Nephrology for  maintenance HD, appreciate cares -Continue renavite  Elevated troponin:  Suspect low clearance in ESRD patient -Cycle enzymes  Chronic systolic CHF:  EF 45-50% earlier this year, moderate AR, grade 2 diastolic dysfunction. -Continue hydralazine -Not on furosemide  HTN, CAD s/p CABG and peripheral vascular disease s/p fempop bypass:  -Continue hydralazine with hold parameters  pAF:  On amiodarone. CHADS2-VASc 7. On warfarin. -Continue amiodarone -Continue warfarin  Gout:  -Continue Uloric   DVT prophylaxis:  Code Status: DNR Family Communication:  Disposition Plan:  Diet: Diet renal with fluid restriction Fluid restriction:: 1200 mL Fluid; Room service appropriate?: Yes; Fluid consistency:: Thin  Consultants:   Nephrology  Procedures:   None  Antimicrobials:   Cefepime and vancomycin  Objective: Filed Vitals:   12/01/15 1730 12/01/15 1755 12/01/15 2007 12/02/15 0419  BP: 147/72 156/70 140/58 157/53  Pulse: 72 72 67 68  Temp:  99.2 F (37.3 C) 98.9 F (37.2 C) 98 F (36.7 C)  TempSrc:  Oral Oral Oral  Resp: Height:      Weight:  68.4 kg (150 lb 12.7 oz)  68.584 kg (151 lb 3.2 oz)  SpO2:   98% 96%    Intake/Output Summary (Last 24 hours) at 12/02/15 1317 Last data filed at 12/02/15 0919  Gross per 24 hour  Intake    960 ml  Output   2153 ml  Net  -1193 ml   Filed Weights   12/01/15 1451 12/01/15 1755 12/02/15 0419  Weight: 70.4 kg (155 lb 3.3 oz) 68.4 kg (150 lb 12.7 oz) 68.584 kg (151 lb 3.2 oz)    Examination: General exam: Appears calm and comfortable  Respiratory system: Clear to auscultation. Respiratory effort normal. Cardiovascular system: S1 & S2 heard, RRR. No JVD,  murmurs, rubs, gallops or clicks. No pedal edema. Gastrointestinal system: Abdomen is nondistended, soft and nontender. No organomegaly or masses felt. Normal bowel sounds heard. Central nervous system: Alert and oriented. No focal neurological  deficits. Extremities: Symmetric 5 x 5 power. Skin: No rashes, lesions or ulcers Psychiatry: Judgement and insight appear normal. Mood & affect appropriate.   Data Reviewed: I have personally reviewed following labs and imaging studies  CBC:  Recent Labs Lab 11/29/15 0013 11/29/15 0452  WBC 22.0* 19.9*  HGB 12.4 10.2*  HCT 38.5 32.9*  MCV 102.9* 104.1*  PLT 269 229   Basic Metabolic Panel:  Recent Labs Lab 11/29/15 0013 11/29/15 0452 11/30/15 0320 12/01/15 0522 12/02/15 0232  NA 135 134* 131* 134* 135  K 3.6 3.5 4.0 3.5 3.8  CL 98* 100* 97* 97* 99*  CO2 25 23 21* 25 27  GLUCOSE 87 79 77 83 103*  BUN 25* 29* 49* 26* 17  CREATININE 3.64* 3.76* 4.82* 2.86* 2.18*  CALCIUM 9.0 8.3* 8.4* 8.3* 8.2*   GFR: Estimated Creatinine Clearance: 19.8 mL/min (by C-G formula based on Cr of 2.18). Liver Function Tests: No results for input(s): AST, ALT, ALKPHOS, BILITOT, PROT, ALBUMIN in the last 168 hours. No results for input(s): LIPASE, AMYLASE in the last 168 hours. No results for input(s): AMMONIA in the last 168 hours. Coagulation Profile:  Recent Labs Lab 11/29/15 0452 11/30/15 0320 12/01/15 0522 12/02/15 0232  INR 2.14* 3.14* 2.00* 1.67*   Cardiac Enzymes:  Recent Labs Lab 11/29/15 0452  TROPONINI 0.08*   BNP (last 3 results) No results for input(s): PROBNP in the last 8760 hours. HbA1C: No results for input(s): HGBA1C in the last 72 hours. CBG: No results for input(s): GLUCAP in the last 168 hours. Lipid Profile: No results for input(s): CHOL, HDL, LDLCALC, TRIG, CHOLHDL, LDLDIRECT in the last 72 hours. Thyroid Function Tests: No results for input(s): TSH, T4TOTAL, FREET4, T3FREE, THYROIDAB in the last 72 hours. Anemia Panel: No results for input(s): VITAMINB12, FOLATE, FERRITIN, TIBC, IRON, RETICCTPCT in the last 72 hours. Urine analysis:    Component Value Date/Time   COLORURINE AMBER* 11/29/2015 1559   APPEARANCEUR TURBID* 11/29/2015 1559    LABSPEC 1.027 11/29/2015 1559   PHURINE 5.0 11/29/2015 1559   GLUCOSEU NEGATIVE 11/29/2015 1559   HGBUR NEGATIVE 11/29/2015 1559   BILIRUBINUR SMALL* 11/29/2015 1559   KETONESUR 15* 11/29/2015 1559   PROTEINUR 100* 11/29/2015 1559   NITRITE NEGATIVE 11/29/2015 1559   LEUKOCYTESUR LARGE* 11/29/2015 1559   Sepsis Labs: @LABRCNTIP (procalcitonin:4,lacticidven:4)  ) Recent Results (from the past 240 hour(s))  Culture, blood (routine x 2)     Status: Abnormal   Collection Time: 11/29/15 12:13 AM  Result Value Ref Range Status   Specimen Description BLOOD RIGHT ANTECUBITAL  Final   Special Requests BOTTLES DRAWN AEROBIC AND ANAEROBIC 5CC   Final   Culture  Setup Time   Final    GRAM POSITIVE COCCI IN PAIRS AND CHAINS IN BOTH AEROBIC AND ANAEROBIC BOTTLES CRITICAL RESULT CALLED TO, READ BACK BY AND VERIFIED WITH: J MARKLE,PHARMD AT 1525 11/29/15 BY L BENFIELD    Culture STREPTOCOCCUS PNEUMONIAE (A)  Final   Report Status 12/01/2015 FINAL  Final   Organism ID, Bacteria STREPTOCOCCUS PNEUMONIAE  Final      Susceptibility   Streptococcus pneumoniae - MIC*    ERYTHROMYCIN >=8 RESISTANT Resistant     LEVOFLOXACIN 0.5 SENSITIVE Sensitive     PENICILLIN <=0.06      CEFTRIAXONE <=0.12 SENSITIVE Sensitive     *  STREPTOCOCCUS PNEUMONIAE  Blood Culture ID Panel (Reflexed)     Status: Abnormal   Collection Time: 11/29/15 12:13 AM  Result Value Ref Range Status   Enterococcus species NOT DETECTED NOT DETECTED Final   Vancomycin resistance NOT DETECTED NOT DETECTED Final   Listeria monocytogenes NOT DETECTED NOT DETECTED Final   Staphylococcus species NOT DETECTED NOT DETECTED Final   Staphylococcus aureus NOT DETECTED NOT DETECTED Final   Methicillin resistance NOT DETECTED NOT DETECTED Final   Streptococcus species DETECTED (A) NOT DETECTED Final    Comment: CRITICAL RESULT CALLED TO, READ BACK BY AND VERIFIED WITH: J MARKLE,PHARMD AT 1525 11/29/15 BY L BENFIELD    Streptococcus agalactiae  NOT DETECTED NOT DETECTED Final   Streptococcus pneumoniae DETECTED (A) NOT DETECTED Final    Comment: CRITICAL RESULT CALLED TO, READ BACK BY AND VERIFIED WITH: J MARKLE,PHARMD AT 1525 11/29/15 BY L BENFIELD    Streptococcus pyogenes NOT DETECTED NOT DETECTED Final   Acinetobacter baumannii NOT DETECTED NOT DETECTED Final   Enterobacteriaceae species NOT DETECTED NOT DETECTED Final   Enterobacter cloacae complex NOT DETECTED NOT DETECTED Final   Escherichia coli NOT DETECTED NOT DETECTED Final   Klebsiella oxytoca NOT DETECTED NOT DETECTED Final   Klebsiella pneumoniae NOT DETECTED NOT DETECTED Final   Proteus species NOT DETECTED NOT DETECTED Final   Serratia marcescens NOT DETECTED NOT DETECTED Final   Carbapenem resistance NOT DETECTED NOT DETECTED Final   Haemophilus influenzae NOT DETECTED NOT DETECTED Final   Neisseria meningitidis NOT DETECTED NOT DETECTED Final   Pseudomonas aeruginosa NOT DETECTED NOT DETECTED Final   Candida albicans NOT DETECTED NOT DETECTED Final   Candida glabrata NOT DETECTED NOT DETECTED Final   Candida krusei NOT DETECTED NOT DETECTED Final   Candida parapsilosis NOT DETECTED NOT DETECTED Final   Candida tropicalis NOT DETECTED NOT DETECTED Final  Culture, blood (routine x 2)     Status: None (Preliminary result)   Collection Time: 11/29/15  2:05 AM  Result Value Ref Range Status   Specimen Description BLOOD RIGHT HAND  Final   Special Requests IN PEDIATRIC BOTTLE 3CC  Final   Culture NO GROWTH 2 DAYS  Final   Report Status PENDING  Incomplete  Urine culture     Status: None   Collection Time: 11/29/15 10:12 PM  Result Value Ref Range Status   Specimen Description URINE, CLEAN CATCH  Final   Special Requests NONE  Final   Culture NO GROWTH  Final   Report Status 12/01/2015 FINAL  Final     Invalid input(s): PROCALCITONIN, LACTICACIDVEN   Radiology Studies: No results found.      Scheduled Meds: . amiodarone  200 mg Oral Daily  .  cefTRIAXone (ROCEPHIN)  IV  2 g Intravenous Q24H  . febuxostat  40 mg Oral Daily  . feeding supplement (NEPRO CARB STEADY)  237 mL Oral BID BM  . heparin  2,000 Units Dialysis Once in dialysis  . hydrALAZINE  25 mg Oral BID  . multivitamin  1 tablet Oral Daily  . sodium chloride flush  3 mL Intravenous Q12H  . warfarin  5 mg Oral ONCE-1800  . Warfarin - Pharmacist Dosing Inpatient   Does not apply q1800   Continuous Infusions:    LOS: 3 days    Time spent: 35 minutes    Barnetta Chapel, MD [facility] Pager [phone]  If 7PM-7AM, please contact night-coverage [email or web address] Password [email or web address] 12/02/2015, 1:17 PM

## 2015-12-03 DIAGNOSIS — A409 Streptococcal sepsis, unspecified: Secondary | ICD-10-CM

## 2015-12-03 DIAGNOSIS — I2510 Atherosclerotic heart disease of native coronary artery without angina pectoris: Secondary | ICD-10-CM

## 2015-12-03 LAB — CBC WITH DIFFERENTIAL/PLATELET
Basophils Absolute: 0 10*3/uL (ref 0.0–0.1)
Basophils Relative: 1 %
Eosinophils Absolute: 0.2 10*3/uL (ref 0.0–0.7)
Eosinophils Relative: 2 %
HCT: 32.9 % — ABNORMAL LOW (ref 36.0–46.0)
Hemoglobin: 10.7 g/dL — ABNORMAL LOW (ref 12.0–15.0)
Lymphocytes Relative: 13 %
Lymphs Abs: 1 10*3/uL (ref 0.7–4.0)
MCH: 32.5 pg (ref 26.0–34.0)
MCHC: 32.5 g/dL (ref 30.0–36.0)
MCV: 100 fL (ref 78.0–100.0)
Monocytes Absolute: 1.3 10*3/uL — ABNORMAL HIGH (ref 0.1–1.0)
Monocytes Relative: 17 %
Neutro Abs: 5.3 10*3/uL (ref 1.7–7.7)
Neutrophils Relative %: 67 %
Platelets: 259 10*3/uL (ref 150–400)
RBC: 3.29 MIL/uL — ABNORMAL LOW (ref 3.87–5.11)
RDW: 13.2 % (ref 11.5–15.5)
WBC: 7.7 10*3/uL (ref 4.0–10.5)

## 2015-12-03 LAB — RENAL FUNCTION PANEL
Albumin: 2.7 g/dL — ABNORMAL LOW (ref 3.5–5.0)
Anion gap: 13 (ref 5–15)
BUN: 33 mg/dL — ABNORMAL HIGH (ref 6–20)
CO2: 23 mmol/L (ref 22–32)
Calcium: 8.6 mg/dL — ABNORMAL LOW (ref 8.9–10.3)
Chloride: 95 mmol/L — ABNORMAL LOW (ref 101–111)
Creatinine, Ser: 3.05 mg/dL — ABNORMAL HIGH (ref 0.44–1.00)
GFR calc Af Amer: 16 mL/min — ABNORMAL LOW (ref >60)
GFR calc non Af Amer: 14 mL/min — ABNORMAL LOW (ref >60)
Glucose, Bld: 88 mg/dL (ref 65–99)
Phosphorus: 3.8 mg/dL (ref 2.5–4.6)
Potassium: 4.1 mmol/L (ref 3.5–5.1)
Sodium: 131 mmol/L — ABNORMAL LOW (ref 135–145)

## 2015-12-03 LAB — PROTIME-INR
INR: 1.62 — ABNORMAL HIGH (ref 0.00–1.49)
Prothrombin Time: 19.3 seconds — ABNORMAL HIGH (ref 11.6–15.2)

## 2015-12-03 LAB — PROCALCITONIN: PROCALCITONIN: 4.52 ng/mL

## 2015-12-03 MED ORDER — HEPARIN SODIUM (PORCINE) 1000 UNIT/ML DIALYSIS: 1000.0000 [IU] | INTRAMUSCULAR | Status: DC | PRN

## 2015-12-03 MED ORDER — SODIUM CHLORIDE 0.9 % IV SOLN: 100.0000 mL | INTRAVENOUS | Status: DC | PRN

## 2015-12-03 MED ORDER — DEXTROSE 5 % IV SOLN: 2.0000 g | INTRAVENOUS | Status: AC

## 2015-12-03 MED ORDER — HEPARIN SODIUM (PORCINE) 1000 UNIT/ML DIALYSIS: 20.0000 [IU]/kg | INTRAMUSCULAR | Status: DC | PRN

## 2015-12-03 MED ORDER — ALTEPLASE 2 MG IJ SOLR: 2.0000 mg | Freq: Once | INTRAMUSCULAR | Status: DC | PRN

## 2015-12-03 MED ORDER — OXYCODONE HCL 5 MG PO TABS: 5.0000 mg | ORAL_TABLET | Freq: Four times a day (QID) | ORAL | Status: AC | PRN

## 2015-12-03 MED ORDER — PENTAFLUOROPROP-TETRAFLUOROETH EX AERO: 1.0000 "application " | INHALATION_SPRAY | CUTANEOUS | Status: DC | PRN

## 2015-12-03 MED ORDER — LIDOCAINE HCL (PF) 1 % IJ SOLN: 5.0000 mL | INTRAMUSCULAR | Status: DC | PRN

## 2015-12-03 MED ORDER — LIDOCAINE-PRILOCAINE 2.5-2.5 % EX CREA: 1.0000 "application " | TOPICAL_CREAM | CUTANEOUS | Status: DC | PRN

## 2015-12-03 NOTE — Discharge Summary (Signed)
Pt got discharged to home, discharge instructions provided and patient showed understanding to it, IV taken out,Telemonitor DC,pt left unit in wheelchair with all of the belongings accompanied with a family member (Husband) 

## 2015-12-03 NOTE — Discharge Summary (Signed)
Discharge Summary  [name] AVW:[id number] DOB: 1935/08/07  PCP: Michiel Sites, MD  Admit date: 11/28/2015 Discharge date: 12/03/2015   Recommendations for Outpatient Follow-up:  1. PCP 2 weeks 2. Nephrology 4 weeks   Discharge Diagnoses:  Active Hospital Problems   Diagnosis Date Noted  . Sepsis (HCC) 11/29/2015  . HCAP (healthcare-associated pneumonia) 11/29/2015  . End stage renal disease (HCC) 10/20/2015  . Persistent atrial fibrillation (HCC)   . PAD (peripheral artery disease) (HCC)   . Hypertension, essential   . CAD in native artery - s/p CABG 4 05/02/2006    Class: Diagnosis of    Resolved Hospital Problems   Diagnosis Date Noted Date Resolved  No resolved problems to display.    Discharge Condition: Stable   Diet recommendation: Renal   Filed Vitals:   12/03/15 1100 12/03/15 1135  BP: 131/82 131/88  Pulse: 100 89  Temp:  98 F (36.7 C)  Resp: 19 18    History of present illness:  69 F with ESRD CHF admitted with cough, shortness of breath diagnosed with CAP.    Hospital Course:  Sepsis  -Presented with tachycardia, tachypnea, fever in the presence of pneumonia, meets sepsis criteria on admission. -Lactate was 2.1 indicating endorgan damage. -Treated with broad-spectrum antibiotics, no IV fluids because of ESRD -Sepsis is secondary to pneumonia and septicemia, no resolved - she had some back pain but it improved, has been afebrile, and so discitis is not suspected  Streptococcus pneumoniae bacteremia - See above. -Likely secondary to pneumonia, blood cultures 1 out of 2 showed Streptococcus pneumoniae. -Started on broad-spectrum antibiotics, will switch to 2 g of Rocephin. -Blood cultures repeated and so far negative, so no need to change HD catheter - discussed with Dr. Arlean Hopping this AM, she will complete total 2 week course of Rocephin during HD for her bacteremia  Pneumonia, HCAP -CXR showed multifocal pneumonia, patient was on  Zosyn and vancomycin treat as HCAP. -Has a Streptococcus bacteremia and urine is positive for Streptococcus antigen, will switch antibiotic to 2 g of Rocephin. -was treated with supportive management with bronchodilators, mucolytics, antitussives and oxygen as needed. She has now been weaned off O2 and is feeling ready to go home.  ESRD:  -Consult to Nephrology for maintenance HD, appreciate cares -Continue renavite  Elevated troponin:  Suspect low clearance in ESRD patient -Cycle enzymes  Chronic systolic CHF:  EF 45-50% earlier this year, moderate AR, grade 2 diastolic dysfunction. -Continue hydralazine -Not on furosemide - no evidence currently of acute exacerbation  HTN, CAD s/p CABG and peripheral vascular disease s/p fempop bypass:  -Continue hydralazine with hold parameters  pAF:  On amiodarone. CHADS2-VASc 7. On warfarin. -Continue amiodarone -Continue warfarin  Gout:  -Continue Uloric  Procedures:  HD in house   Consultations:  Nephrology   Discharge Exam: BP 131/88 mmHg  Pulse 89  Temp(Src) 98 F (36.7 C) (Oral)  Resp 18  Ht  (1.6 m)  Wt 67.9 kg (149 lb 11.1 oz)  BMI 26.52 kg/m2  SpO2 99% General:  Alert, oriented, calm, in no acute distress  Eyes: pupils round and reactive to light and accomodation, clear sclerea Neck: supple, no masses, trachea mildline  Cardiovascular: RRR, no murmurs or rubs, no peripheral edema  Respiratory: clear to auscultation bilaterally, no wheezes, no crackles  Abdomen: soft, nontender, nondistended, normal bowel tones heard  Skin: dry, no rashes  Musculoskeletal: no joint effusions, normal range of motion  Psychiatric: appropriate affect, normal speech  Neurologic:  extraocular muscles intact, clear speech, moving all extremities with intact sensorium    Discharge Instructions You were cared for by a hospitalist during your hospital stay. If you have any questions about your discharge medications or the care  you received while you were in the hospital after you are discharged, you can call the unit and asked to speak with the hospitalist on call if the hospitalist that took care of you is not available. Once you are discharged, your primary care physician will handle any further medical issues. Please note that NO REFILLS for any discharge medications will be authorized once you are discharged, as it is imperative that you return to your primary care physician (or establish a relationship with a primary care physician if you do not have one) for your aftercare needs so that they can reassess your need for medications and monitor your lab values.  Discharge Instructions    Diet - low sodium heart healthy    Complete by:  As directed      Increase activity slowly    Complete by:  As directed             Medication List    TAKE these medications        acetaminophen 325 MG tablet  Commonly known as:  TYLENOL  Take 650 mg by mouth every 6 (six) hours as needed for moderate pain or fever.     amiodarone 200 MG tablet  Commonly known as:  PACERONE  Take 1 tablet (200 mg total) by mouth daily. 400 mg daily for 1 week then decrease to 200 mg daily     betamethasone dipropionate 0.05 % cream  Commonly known as:  DIPROLENE  Apply 1 application topically 2 (two) times daily as needed. ECZEMA     Biotin 1000 MCG tablet  Take 1,000 mcg by mouth daily.     cefTRIAXone 2 g in dextrose 5 % 50 mL  Inject 2 g into the vein daily.     colchicine 0.6 MG tablet  Take 0.6 mg by mouth 2 (two) times daily as needed. GOUT FLARE UP.     febuxostat 40 MG tablet  Commonly known as:  ULORIC  Take 40 mg by mouth daily.     hydrALAZINE 25 MG tablet  Commonly known as:  APRESOLINE  Take 1 tablet by mouth twice daily. You can take an extra 25mg  in the morning if your systolic BP is greater than 160.     ICAPS Caps  Take 1 capsule by mouth 2 (two) times daily.     LORazepam 0.5 MG tablet  Commonly known  as:  ATIVAN  Take 0.5 mg by mouth 4 (four) times daily as needed for anxiety.     multivitamin Tabs tablet  Take 1 tablet by mouth daily.     omeprazole 20 MG capsule  Commonly known as:  PRILOSEC  Take 20 mg by mouth daily.     oxyCODONE 5 MG immediate release tablet  Commonly known as:  Oxy IR/ROXICODONE  Take 1 tablet (5 mg total) by mouth every 6 (six) hours as needed for severe pain.     warfarin 2.5 MG tablet  Commonly known as:  COUMADIN  Take 1 tablet (2.5 mg total) by mouth daily. Take 1 tablet by mouth daily or as directed by coumadin clinic       Allergies  Allergen Reactions  . Latex Swelling and Rash  . Statins Other (See Comments)  CLASS REACTION:  DIFFUSE CRAMPING Includes Lipitor, Crestor, Pravachol and simvastatin  . Angiotensin Receptor Blockers Itching and Rash  . Benicar [Olmesartan] Itching and Rash       Follow-up Information    Follow up with Michiel Sites, MD. Go on 12/07/2015.   Specialty:  Endocrinology   Why:  @ 3:00pm   Contact information:   [address] [place] [address] [phone]        The results of significant diagnostics from this hospitalization (including imaging, microbiology, ancillary and laboratory) are listed below for reference.    Significant Diagnostic Studies: Dg Chest 2 View  11/29/2015  CLINICAL DATA:  Shortness of breath, cough today. EXAM: CHEST  2 VIEW COMPARISON:  Chest radiograph 07/12/2015, chest CT 07/08/2015 FINDINGS: Left-sided dialysis catheter, tip at the atrial caval junction. Patient is post median sternotomy. Cardiomegaly is stable. Rounded left perihilar and right suprahilar opacities, new from prior exam. There are small pleural effusions. No pneumothorax. IMPRESSION: Bilateral perihilar opacities, differential considerations of multifocal pneumonia versus asymmetric pulmonary edema. Cardiomegaly and small pleural effusions, chronic. Electronically Signed   By: Rubye Oaks M.D.   On: 11/29/2015 01:02    Microbiology: Recent Results (from the past 240 hour(s))  Culture, blood (routine x 2)     Status: Abnormal   Collection Time: 11/29/15 12:13 AM  Result Value Ref Range Status   Specimen Description BLOOD RIGHT ANTECUBITAL  Final   Special Requests BOTTLES DRAWN AEROBIC AND ANAEROBIC 5CC   Final   Culture  Setup Time   Final    GRAM POSITIVE COCCI IN PAIRS AND CHAINS IN BOTH AEROBIC AND ANAEROBIC BOTTLES CRITICAL RESULT CALLED TO, READ BACK BY AND VERIFIED WITH: J MARKLE,PHARMD AT 1525 11/29/15 BY L BENFIELD    Culture STREPTOCOCCUS PNEUMONIAE (A)  Final   Report Status 12/01/2015 FINAL  Final   Organism ID, Bacteria STREPTOCOCCUS PNEUMONIAE  Final      Susceptibility   Streptococcus pneumoniae - MIC*    ERYTHROMYCIN >=8 RESISTANT Resistant     LEVOFLOXACIN 0.5 SENSITIVE Sensitive     PENICILLIN <=0.06      CEFTRIAXONE <=0.12 SENSITIVE Sensitive     * STREPTOCOCCUS PNEUMONIAE  Blood Culture ID Panel (Reflexed)     Status: Abnormal   Collection Time: 11/29/15 12:13 AM  Result Value Ref Range Status   Enterococcus species NOT DETECTED NOT DETECTED Final   Vancomycin resistance NOT DETECTED NOT DETECTED Final   Listeria monocytogenes NOT DETECTED NOT DETECTED Final   Staphylococcus species NOT DETECTED NOT DETECTED Final   Staphylococcus aureus NOT DETECTED NOT DETECTED Final   Methicillin resistance NOT DETECTED NOT DETECTED Final   Streptococcus species DETECTED (A) NOT DETECTED Final    Comment: CRITICAL RESULT CALLED TO, READ BACK BY AND VERIFIED WITH: J MARKLE,PHARMD AT 1525 11/29/15 BY L BENFIELD    Streptococcus agalactiae NOT DETECTED NOT DETECTED Final   Streptococcus pneumoniae DETECTED (A) NOT DETECTED Final    Comment: CRITICAL RESULT CALLED TO, READ BACK BY AND VERIFIED WITH: J MARKLE,PHARMD AT 1525 11/29/15 BY L BENFIELD    Streptococcus pyogenes NOT DETECTED NOT DETECTED Final   Acinetobacter baumannii NOT DETECTED NOT DETECTED  Final   Enterobacteriaceae species NOT DETECTED NOT DETECTED Final   Enterobacter cloacae complex NOT DETECTED NOT DETECTED Final   Escherichia coli NOT DETECTED NOT DETECTED Final   Klebsiella oxytoca NOT DETECTED NOT DETECTED Final   Klebsiella pneumoniae NOT DETECTED NOT DETECTED Final   Proteus species NOT  DETECTED NOT DETECTED Final   Serratia marcescens NOT DETECTED NOT DETECTED Final   Carbapenem resistance NOT DETECTED NOT DETECTED Final   Haemophilus influenzae NOT DETECTED NOT DETECTED Final   Neisseria meningitidis NOT DETECTED NOT DETECTED Final   Pseudomonas aeruginosa NOT DETECTED NOT DETECTED Final   Candida albicans NOT DETECTED NOT DETECTED Final   Candida glabrata NOT DETECTED NOT DETECTED Final   Candida krusei NOT DETECTED NOT DETECTED Final   Candida parapsilosis NOT DETECTED NOT DETECTED Final   Candida tropicalis NOT DETECTED NOT DETECTED Final  Culture, blood (routine x 2)     Status: None (Preliminary result)   Collection Time: 11/29/15  2:05 AM  Result Value Ref Range Status   Specimen Description BLOOD RIGHT HAND  Final   Special Requests IN PEDIATRIC BOTTLE 3CC  Final   Culture NO GROWTH 3 DAYS  Final   Report Status PENDING  Incomplete  Urine culture     Status: None   Collection Time: 11/29/15 10:12 PM  Result Value Ref Range Status   Specimen Description URINE, CLEAN CATCH  Final   Special Requests NONE  Final   Culture NO GROWTH  Final   Report Status 12/01/2015 FINAL  Final  Culture, blood (Routine X 2) w Reflex to ID Panel     Status: None (Preliminary result)   Collection Time: 12/01/15 12:33 PM  Result Value Ref Range Status   Specimen Description BLOOD RIGHT HAND  Final   Special Requests IN PEDIATRIC BOTTLE 3CC  Final   Culture NO GROWTH < 24 HOURS  Final   Report Status PENDING  Incomplete  Culture, blood (Routine X 2) w Reflex to ID Panel     Status: None (Preliminary result)   Collection Time: 12/01/15 12:39 PM  Result Value Ref Range  Status   Specimen Description BLOOD RIGHT HAND  Final   Special Requests IN PEDIATRIC BOTTLE 2.5 CC  Final   Culture NO GROWTH < 24 HOURS  Final   Report Status PENDING  Incomplete     Labs: Basic Metabolic Panel:  Recent Labs Lab 11/29/15 0452 11/30/15 0320 12/01/15 0522 12/02/15 0232 12/03/15 0752  NA 134* 131* 134* 135 131*  K 3.5 4.0 3.5 3.8 4.1  CL 100* 97* 97* 99* 95*  CO2 23 21* 25 27 23   GLUCOSE 79 77 83 103* 88  BUN 29* 49* 26* 17 33*  CREATININE 3.76* 4.82* 2.86* 2.18* 3.05*  CALCIUM 8.3* 8.4* 8.3* 8.2* 8.6*  PHOS  --   --   --   --  3.8   Liver Function Tests:  Recent Labs Lab 12/03/15 0752  ALBUMIN 2.7*   No results for input(s): LIPASE, AMYLASE in the last 168 hours. No results for input(s): AMMONIA in the last 168 hours. CBC:  Recent Labs Lab 11/29/15 0013 11/29/15 0452 12/03/15 0505  WBC 22.0* 19.9* 7.7  NEUTROABS  --   --  5.3  HGB 12.4 10.2* 10.7*  HCT 38.5 32.9* 32.9*  MCV 102.9* 104.1* 100.0  PLT 269 229 259   Cardiac Enzymes:  Recent Labs Lab 11/29/15 0452  TROPONINI 0.08*   BNP: BNP (last 3 results)  Recent Labs  07/03/15 1236  BNP 4223.3*    ProBNP (last 3 results) No results for input(s): PROBNP in the last 8760 hours.  CBG: No results for input(s): GLUCAP in the last 168 hours.  Time spent: 42 minutes were spent in preparing this discharge including medication reconciliation, counseling, and coordination of care.  Signed:  Jiovanni Heeter [employer]  [facility] 12/03/2015, 1:14 PM

## 2015-12-03 NOTE — Progress Notes (Signed)
[facility] Progress Note  Assessment/Plan: 1. Strep pn bacteremia/ HCAP- per primary - repeat surveillance BC7/11 no growth. Strep pna would be very unusual pathogen to cause cath sepsis, and she did have usual symptoms and CXR for pneumonia.   2. ESRD -TTS - infiltrated AVF Monday (first use) - rested on Tuesday - using one needle successfully at 250 - would hold on removing cath for now - need to get cath sutures out today 3. Hypertension/volume - net UF 2 L Monday and Tuesday - with Tuesday post wt 68.4 (EDW 70) - lower edw for d/c- titrate down a little further - 4. Anemia - s/o post recent short course of Fe hgb 10.7 7/13 consistent with outpt Hgb - not on ESA  5. Metabolic bone disease - No VDRA P 3.8 6. Nutrition - renal diet/vits/nepro 7. AFib owith chronic anticoagulation - INR 1.62 - can titrate further as outpt 8. CAD s/p CABG 9. Anxiety - she takes one ativan an hour before HD and another half way through HD 10. Dispo - per primary. -ok per renal for discharge 11. Back pain - intermittent - none last night  Sheffield Slider, PA-C [facility] Beeper [phone] 12/03/2015,8:42 AM  LOS: 4 days   Pt seen, examined and agree w A/P as above.  Much better , afeb and WBC returned to normal on abx.  Have d/w primary, will dc today and give 9 more days IV Elita Quick w HD (2 wks total).   Vinson Moselle MD [facility] pager [phone]    cell [phone] 12/03/2015, 11:02 AM    Subjective:   No c/o   Objective Filed Vitals:   12/03/15 0515 12/03/15 0735 12/03/15 0740 12/03/15 0800  BP: 162/59 162/67 151/69 159/60  Pulse: 80 80 73 78  Temp: 98.4 F (36.9 C) 98.4 F (36.9 C)    TempSrc: Oral Oral    Resp: Height:      Weight: 69.446 kg (153 lb 1.6 oz) 69.9 kg (154 lb 1.6 oz)    SpO2: 94% 96%     Physical Exam General: NAd on HD Heart: irreg irreg Lungs: no rales Abdomen: soft NT Extremities: no  edema Dialysis Access: left lower AVF 1 needle Qb 250 and left IJ  Dialysis Orders: NW TTS 4 hr 160 400/800 EDW 70 2 K 2.25 Ca no profile left lower AVF and left IJ TDC 2100 heparin No Fe, ESA or VDRA Recent labs: hgb 10.4 6/22 19% sat ferritin 435 - last Fe given 7/1 iPTH 110  Additional Objective Labs: Basic Metabolic Panel:  Recent Labs Lab 12/01/15 0522 12/02/15 0232 12/03/15 0752  NA 134* 135 131*  K 3.5 3.8 4.1  CL 97* 99* 95*  CO2 GLUCOSE 83 103* 88  BUN 26* 17 33*  CREATININE 2.86* 2.18* 3.05*  CALCIUM 8.3* 8.2* 8.6*  PHOS  --   --  3.8   Liver Function Tests:  Recent Labs Lab 12/03/15 0752  ALBUMIN 2.7*   CBC:  Recent Labs Lab 11/29/15 0013 11/29/15 0452 12/03/15 0505  WBC 22.0* 19.9* 7.7  NEUTROABS  --   --  5.3  HGB 12.4 10.2* 10.7*  HCT 38.5 32.9* 32.9*  MCV 102.9* 104.1* 100.0  PLT 269 229 259   Blood Culture    Component Value Date/Time   SDES BLOOD RIGHT HAND 12/01/2015 1239   SPECREQUEST IN PEDIATRIC BOTTLE 2.5 CC 12/01/2015 1239   CULT NO  GROWTH < 24 HOURS 12/01/2015 1239   REPTSTATUS PENDING 12/01/2015 1239    Cardiac Enzymes:  Recent Labs Lab 11/29/15 0452  TROPONINI 0.08*   Lab Results  Component Value Date   INR 1.62* 12/03/2015   INR 1.67* 12/02/2015   INR 2.00* 12/01/2015   Studies/Results: No results found. Medications:   . amiodarone  200 mg Oral Daily  . cefTRIAXone (ROCEPHIN)  IV  2 g Intravenous Q24H  . febuxostat  40 mg Oral Daily  . feeding supplement (NEPRO CARB STEADY)  237 mL Oral BID BM  . heparin  2,000 Units Dialysis Once in dialysis  . hydrALAZINE  25 mg Oral BID  . multivitamin  1 tablet Oral Daily  . sodium chloride flush  3 mL Intravenous Q12H  . Warfarin - Pharmacist Dosing Inpatient   Does not apply [phone]

## 2015-12-04 LAB — CULTURE, BLOOD (ROUTINE X 2): Culture: NO GROWTH

## 2015-12-06 LAB — CULTURE, BLOOD (ROUTINE X 2)
CULTURE: NO GROWTH
CULTURE: NO GROWTH

## 2015-12-08 ENCOUNTER — Other Ambulatory Visit: Payer: Self-pay | Admitting: *Deleted

## 2015-12-08 NOTE — Patient Outreach (Signed)
[facility]) Care Management  12/08/2015  HALLIE CHRISTOFFEL 02-16-1936 [id number]   EMMI-Copd referral:  Telephone call to patient; left message on voice mail requesting call back.  Plan: Will follow up. Colleen Can, RN BSN CCM Care Management Coordinator [facility] Care Management  [phone]

## 2015-12-09 ENCOUNTER — Encounter: Payer: Self-pay | Admitting: *Deleted

## 2015-12-09 ENCOUNTER — Other Ambulatory Visit: Payer: Self-pay | Admitting: *Deleted

## 2015-12-09 NOTE — Patient Outreach (Signed)
[facility]) Care Management  12/09/2015  [name] March 21, 1936 [id number]  Telephone call to patient who was advised of reason for call. HIPPA verification received from patient. Patient states she does not have COPD and wishes for all electronic calls to stop as well as other calls for health management. .   Patient does state that she was recently hospitalized with diagnosis of pneumonia. States she does have  Heart failure and kidney failure(on dialysis). States she had primary care follow up within 1 week of hospital discharge. States she is currently receiving IV antibiotics at dialysis center & sees MD every other day there. States taking other medications consistently as instructed by MD. States she weighs daily and knows to report weight gain to MD as it relates to heart failure. States doing well since hospital discharge. 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  Advised patient of [facility] care management services. Patient declines services community or telephonically. States she will accept [facility] contact information and will call if needed..   Plan: Close out case. Send [facility] contact information to patient. Send to care management assistant to close case Advise appropriate department to take patient off EMMI-COPD call list.  Colleen Can, RN BSN CCM Care Management Coordinator [facility] Care Management  [phone]

## 2015-12-11 ENCOUNTER — Ambulatory Visit (INDEPENDENT_AMBULATORY_CARE_PROVIDER_SITE_OTHER): Payer: Medicare Other | Admitting: Pharmacist

## 2015-12-11 DIAGNOSIS — Z7901 Long term (current) use of anticoagulants: Secondary | ICD-10-CM

## 2015-12-11 DIAGNOSIS — I4819 Other persistent atrial fibrillation: Secondary | ICD-10-CM

## 2015-12-11 DIAGNOSIS — I481 Persistent atrial fibrillation: Secondary | ICD-10-CM

## 2015-12-11 LAB — PROTIME-INR: INR: 4.4 — AB (ref 0.9–1.1)

## 2016-01-12 ENCOUNTER — Other Ambulatory Visit: Payer: Self-pay

## 2016-01-12 DIAGNOSIS — T82510A Breakdown (mechanical) of surgically created arteriovenous fistula, initial encounter: Secondary | ICD-10-CM

## 2016-01-13 ENCOUNTER — Other Ambulatory Visit: Payer: Self-pay

## 2016-01-13 ENCOUNTER — Ambulatory Visit (HOSPITAL_COMMUNITY)
Admission: RE | Admit: 2016-01-13 | Discharge: 2016-01-13 | Disposition: A | Discharge status: Home / Self Care | Payer: Medicare Other | Source: Ambulatory Visit | Attending: Surgery | Admitting: Surgery

## 2016-01-13 ENCOUNTER — Ambulatory Visit (INDEPENDENT_AMBULATORY_CARE_PROVIDER_SITE_OTHER): Payer: Medicare Other | Admitting: Surgery

## 2016-01-13 ENCOUNTER — Encounter: Payer: Self-pay | Admitting: Surgery

## 2016-01-13 ENCOUNTER — Telehealth: Payer: Self-pay | Admitting: Cardiology

## 2016-01-13 VITALS — BP 170/76 | HR 98 | Temp 97.3°F | Resp 16 | Ht 62.0 in | Wt 150.0 lb

## 2016-01-13 DIAGNOSIS — I701 Atherosclerosis of renal artery: Secondary | ICD-10-CM

## 2016-01-13 DIAGNOSIS — K219 Gastro-esophageal reflux disease without esophagitis: Secondary | ICD-10-CM | POA: Diagnosis not present

## 2016-01-13 DIAGNOSIS — I132 Hypertensive heart and chronic kidney disease with heart failure and with stage 5 chronic kidney disease, or end stage renal disease: Secondary | ICD-10-CM | POA: Insufficient documentation

## 2016-01-13 DIAGNOSIS — Z9889 Other specified postprocedural states: Secondary | ICD-10-CM | POA: Insufficient documentation

## 2016-01-13 DIAGNOSIS — I2510 Atherosclerotic heart disease of native coronary artery without angina pectoris: Secondary | ICD-10-CM | POA: Insufficient documentation

## 2016-01-13 DIAGNOSIS — E785 Hyperlipidemia, unspecified: Secondary | ICD-10-CM | POA: Diagnosis not present

## 2016-01-13 DIAGNOSIS — N186 End stage renal disease: Secondary | ICD-10-CM

## 2016-01-13 DIAGNOSIS — I420 Dilated cardiomyopathy: Secondary | ICD-10-CM | POA: Diagnosis not present

## 2016-01-13 DIAGNOSIS — Z992 Dependence on renal dialysis: Secondary | ICD-10-CM

## 2016-01-13 DIAGNOSIS — T82510A Breakdown (mechanical) of surgically created arteriovenous fistula, initial encounter: Secondary | ICD-10-CM

## 2016-01-13 DIAGNOSIS — I509 Heart failure, unspecified: Secondary | ICD-10-CM | POA: Diagnosis not present

## 2016-01-13 NOTE — Telephone Encounter (Signed)
[name] is calling to make  [place] aware that they are holding [name] Coumadin for 5 days prior to her surgery . Surgery is 01/22/16 and her last dose of coumadin will be 01/16/16 and they just want to make sure she does not need to be bridged . Please call if you have any questions

## 2016-01-13 NOTE — Progress Notes (Signed)
Vitals:   01/13/16 1145  BP: (!) 176/82  Pulse: 98  Resp: 16  Temp: 97.3 F (36.3 C)  SpO2: 97%  Weight: 150 lb (68 kg)  Height: 5\' 2"  (1.575 m)

## 2016-01-13 NOTE — Telephone Encounter (Signed)
Will route to coum clinic for review.

## 2016-01-13 NOTE — Progress Notes (Signed)
[facility] [place]  Patient name: [name] MRN: [id number] DOB: 04/18/1936 Sex: female  REASON FOR VISIT: Dialysis access  HPI: [name] is a 80 y.o. female who returns today regarding her left radiocephalic fistula which was created by Dr. Hart Rochester on 09/02/2015.  She continues to dialyze through a catheter.  They have tried to cannulate her fistula several times.  It has worked on occasion but has also infiltrated.  She also developed some bluish discoloration on the tips of her fingers and was sent over with concerns of steal syndrome  Past Medical History:  Diagnosis Date  . A-fib (HCC)    On amiodarone, warfarin  . Abnormal lung function test    Minimal restriction-interstitial, moderate-severe diffusion defect. Reduced lung volumes with increased FEV1/FVC ratio and diffusion defect suggest an interstitial process such as fibrosis or interstitial inflammation.  . Anemia   . CAD in native artery 2007; 08/2015:    a. S/p CABG 2007 (LIMA-LAD, SVG-dRCA, SVG-intermediate, SVG-diagonal). b. Nuclear stress test 08/2012: normal, low risk.; c. RI 100%, o-mRCA 100%, mLAD 60%, dLAD 70%, oD1 99%. LIMA-LAD patent, SVG-PRAV patent, SVG-RI patent, SVG-D1 ~50% dSVG. EDP 26 mHg  . Carotid artery disease (HCC)    a. Mild-mod carotid disease per Dr.Berry's note.  . CHF (congestive heart failure) (HCC)    Combined systolic and diastolic  . CKD (chronic kidney disease), stage III   . Congestive dilated cardiomyopathy [facility]) February 2017-April 2017   Combination of ischemic and atrial fibrillation related -> EF improved from 20-25% up to 40-45% by recent echo.  . End-stage renal disease on hemodialysis (HCC) 07/19/2015  . Essential hypertension   . GERD (gastroesophageal reflux disease)   . Hyperlipidemia LDL goal <70   . Myocardial infarction (HCC)    In setting of pneumonia and A. fib with RVR  . PAD (peripheral artery  disease) (HCC) 1997   Mild-moderate carotid disease; status post right SFA occlusion with Fem-Below Knee Pop bypass (Dr. Hart Rochester); LEA Dopplers 07/2014: RIGHT - ABI 0.84, CIA/EIA 50-69%, CFA/PFA patent Fem-Pop bypass patent w/ 70-99% anastomotic stenosis, patent Pop A with 3 V runoff; LABI 0.85- L CIA/EIA patent, LPFA 70-99%, dLSFA 50-69%, patent LPopA & 3 V runoff.  . Pneumonia   . Renovascular hypertension 12/17/2013   a. s/p L RA Stent 11/2013. b. Renal duplex 02/2015: >60% right proximal renal artery stenosis, normal left renal artery s/p stent, f/u 1 yr recommended.  . S/P CABG x 4 2007;    LIMA-LAD, SVG-Distal RCA, SVG-Ramus, SVG-Diagonal; no echocardiogram done    Family History  Problem Relation Age of Onset  . Breast cancer Mother   . Heart attack Father   . Cancer - Cervical Brother     SOCIAL HISTORY: Social History  Substance Use Topics  . Smoking status: Former Smoker    Types: Cigarettes    Quit date: 05/31/1993  . Smokeless tobacco: Never Used  . Alcohol use 4.2 oz/week    7 Glasses of wine per week     Comment: 4 shots of whiskey per night; denies,  last use 2/10/ 17    Allergies  Allergen Reactions  . Latex Swelling and Rash  . Statins Other (See Comments)    CLASS REACTION:  DIFFUSE CRAMPING Includes Lipitor, Crestor, Pravachol and simvastatin  . Angiotensin Receptor Blockers Itching and Rash  . Benicar [Olmesartan] Itching and Rash    Current Outpatient Prescriptions  Medication Sig Dispense Refill  . acetaminophen (TYLENOL) 325  MG tablet Take 650 mg by mouth every 6 (six) hours as needed for moderate pain or fever.     Marland Kitchen amiodarone (PACERONE) 200 MG tablet Take 1 tablet (200 mg total) by mouth daily. 400 mg daily for 1 week then decrease to 200 mg daily (Patient taking differently: Take 200 mg by mouth daily. ) 100 tablet 3  . betamethasone dipropionate (DIPROLENE) 0.05 % cream Apply 1 application topically 2 (two) times daily as needed. ECZEMA    . Biotin  1000 MCG tablet Take 1,000 mcg by mouth daily.    Marland Kitchen LORazepam (ATIVAN) 0.5 MG tablet Take 0.5 mg by mouth 4 (four) times daily as needed for anxiety.    . Multiple Vitamins-Minerals (ICAPS) CAPS Take 1 capsule by mouth 2 (two) times daily.    . multivitamin (RENA-VIT) TABS tablet Take 1 tablet by mouth daily.    Marland Kitchen omeprazole (PRILOSEC) 20 MG capsule Take 20 mg by mouth daily.    Marland Kitchen warfarin (COUMADIN) 2.5 MG tablet Take 1 tablet (2.5 mg total) by mouth daily. Take 1 tablet by mouth daily or as directed by coumadin clinic (Patient taking differently: Take 2.5 mg by mouth daily. 2.5 mg on Sunday. 3.75 mg all other days) 30 tablet 1  . colchicine 0.6 MG tablet Take 0.6 mg by mouth 2 (two) times daily as needed. GOUT FLARE UP.    . febuxostat (ULORIC) 40 MG tablet Take 40 mg by mouth daily.    . hydrALAZINE (APRESOLINE) 25 MG tablet Take 1 tablet by mouth twice daily. You can take an extra 25mg  in the morning if your systolic BP is greater than 160. (Patient taking differently: Take 25-50 mg by mouth See admin instructions. Take 1 tablet by mouth twice daily. You can take an extra 25mg  in the morning if your systolic BP is greater than 160.) 90 tablet 5  . oxyCODONE (OXY IR/ROXICODONE) 5 MG immediate release tablet Take 1 tablet (5 mg total) by mouth every 6 (six) hours as needed for severe pain. (Patient not taking: Reported on 01/13/2016) 20 tablet 0   No current facility-administered medications for this visit.     REVIEW OF SYSTEMS:  [X]  denotes positive finding, [ ]  denotes negative finding Cardiac  Comments:  Chest pain or chest pressure:    Shortness of breath upon exertion:    Short of breath when lying flat:    Irregular heart rhythm:        Vascular    Pain in calf, thigh, or hip brought on by ambulation:    Pain in feet at night that wakes you up from your sleep:     Blood clot in your veins:    Leg swelling:         Pulmonary    Oxygen at home:    Productive cough:     Wheezing:          Neurologic    Sudden weakness in arms or legs:     Sudden numbness in arms or legs:     Sudden onset of difficulty speaking or slurred speech:    Temporary loss of vision in one eye:     Problems with dizziness:         Gastrointestinal    Blood in stool:     Vomited blood:         Genitourinary    Burning when urinating:     Blood in urine:        Psychiatric  Major depression:         Hematologic    Bleeding problems:    Problems with blood clotting too easily:        Skin    Rashes or ulcers:        Constitutional    Fever or chills:      PHYSICAL EXAM: Vitals:   01/13/16 1145 01/13/16 1148  BP: (!) 176/82 (!) 170/76  Pulse: 98 98  Resp: 16   Temp: 97.3 F (36.3 C)   SpO2: 97%   Weight: 150 lb (68 kg)   Height: 5\' 2"  (1.575 m)     GENERAL: The patient is a well-nourished female, in no acute distress. The vital signs are documented above. CARDIAC: There is a regular rate and rhythm.  VASCULAR: Thrill within the fistula.  No significant change in Doppler sound with fistula compression of the radial artery and within the palmar arch PULMONARY: There is good air exchange bilaterally without wheezing or rales. MUSCULOSKELETAL: There are no major deformities or cyanosis. NEUROLOGIC: No focal weakness or paresthesias are detected. SKIN: There are no ulcers or rashes noted. PSYCHIATRIC: The patient has a normal affect.  DATA:  Ultrasound shows a left radial artery waveform with compression to be 195 and without compression to be 187  MEDICAL ISSUES: I do not feel that the patient has a component of steal syndrome.  However given that she has had difficulty with dialysis, mainly secondary to infiltration, I suspect that her fistula is on the deep side.  I looked back at her old vascular lab study from May and it was marginal being around 0.4-0.5 cm deep.  I also feel that the vein is relatively mobile.  Therefore in order to make cannulation of her fistula  easier, I have recommended elevation of her fistula so as to place the vein directly underneath the incision and was scar tissue keep it from being as mobile.  This is been scheduled for Friday, September 1.  The patient will need to be off of her Coumadin for 5 days prior to her visit.  I did discuss with the patient and her daughter that if this does not work she may need to consider new access.    Durene CalWells Brabham, MD [facility] Tel [phone] Pager [phone]

## 2016-01-14 NOTE — Telephone Encounter (Signed)
Per protocol for Afib with CHADS <4 ok to hold x 5 days prior to procedure. Resume evening of procedure or as directed by doctor.   LM at [facility] with above.

## 2016-01-22 ENCOUNTER — Ambulatory Visit: Admit: 2016-01-22 | Payer: Medicare Other | Admitting: Surgery

## 2016-01-22 SURGERY — FISTULA SUPERFICIALIZATION
Anesthesia: Choice | Laterality: Left

## 2016-01-22 DEATH — deceased

## 2016-01-26 ENCOUNTER — Ambulatory Visit: Payer: Self-pay | Admitting: Pharmacist

## 2016-01-26 DIAGNOSIS — Z7901 Long term (current) use of anticoagulants: Secondary | ICD-10-CM

## 2016-01-26 DIAGNOSIS — I4819 Other persistent atrial fibrillation: Secondary | ICD-10-CM

## 2016-02-05 ENCOUNTER — Ambulatory Visit: Payer: Medicare Other | Admitting: Cardiology

## 2017-05-09 IMAGING — CR DG CHEST 2V
2 series · 2 of 2 positions shown · non-contrast
Comparison: 05/30/2006

CLINICAL DATA: Cough, congestion, shortness of breath, and weakness
for 3 weeks.

EXAM:
CHEST  2 VIEW

[w chest pa]
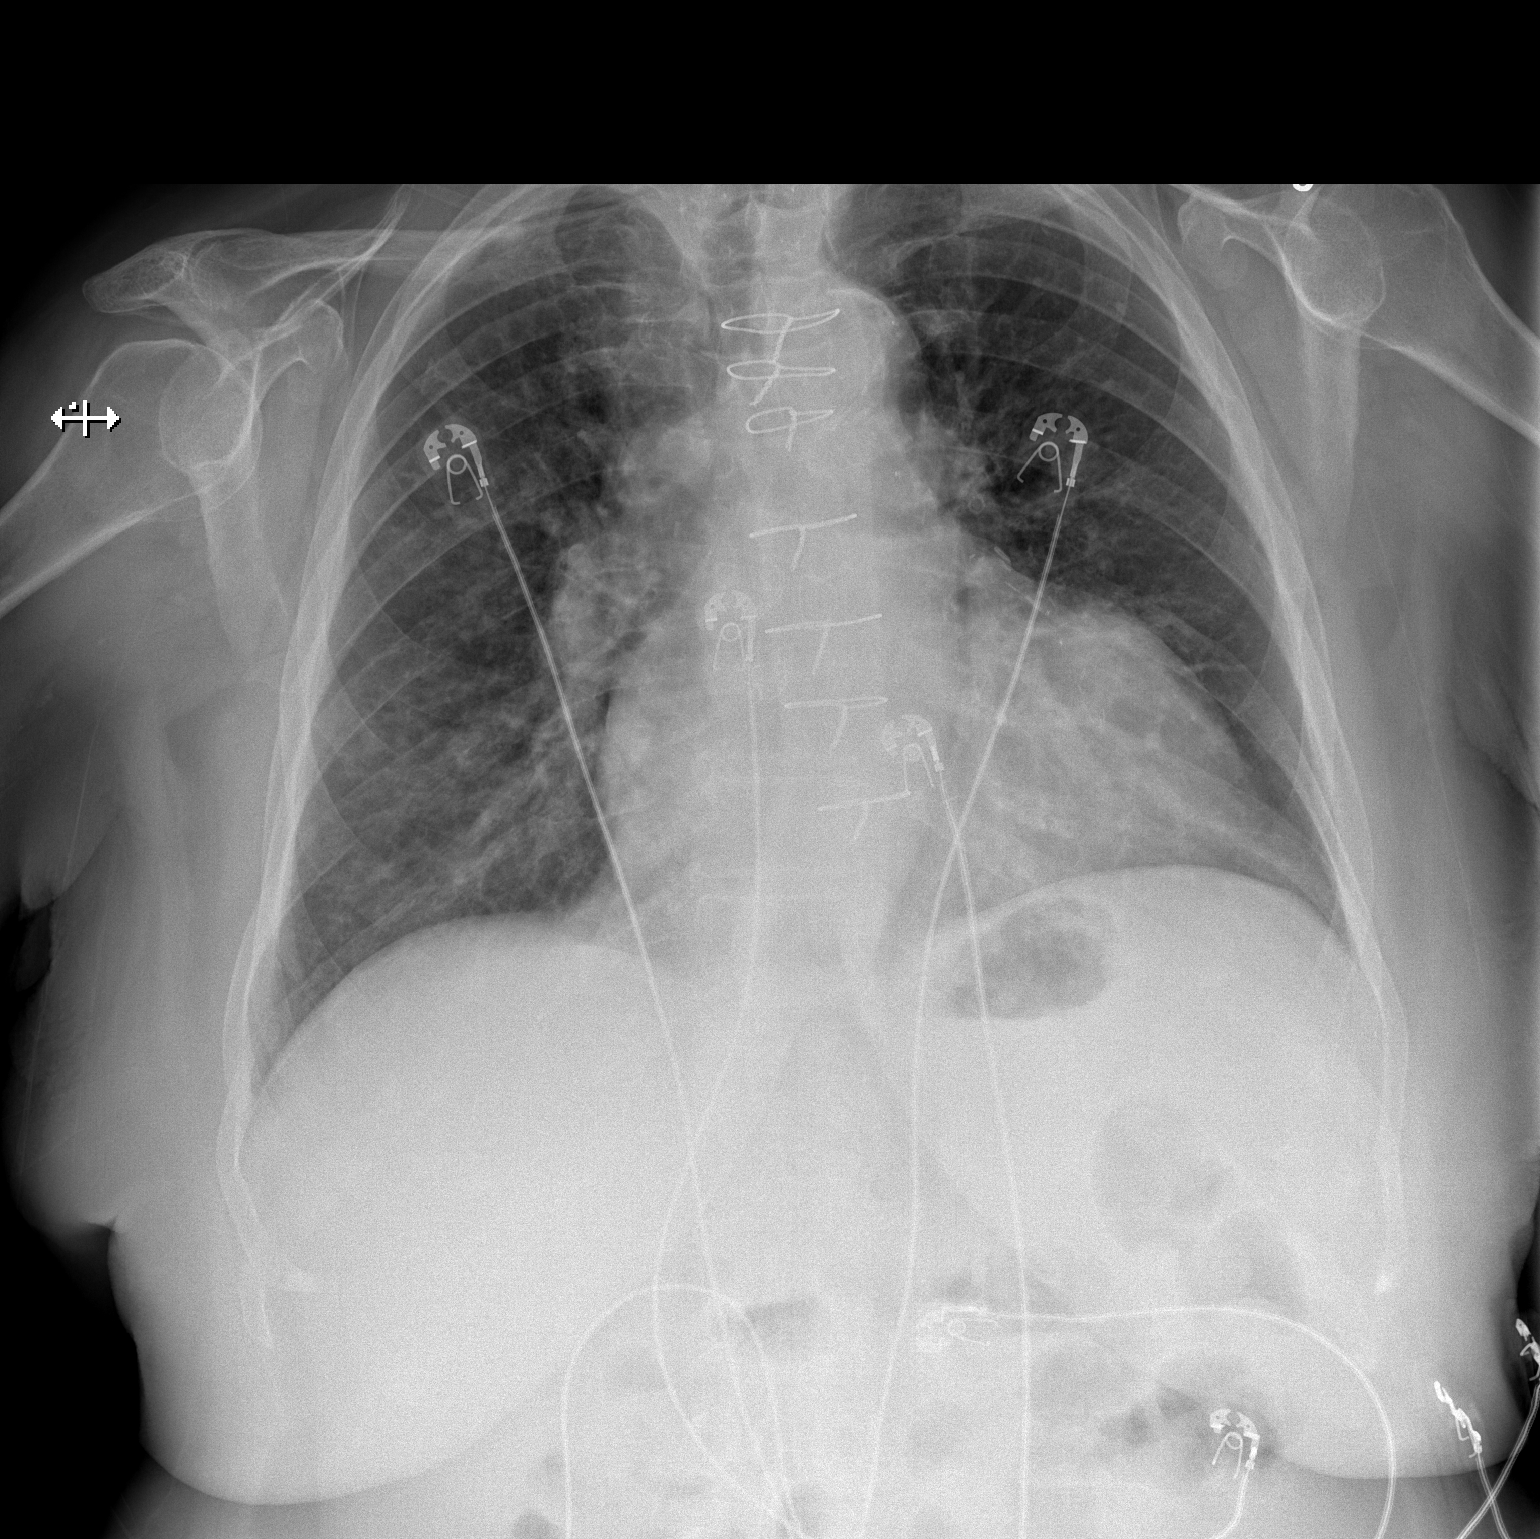

[w chest lat]
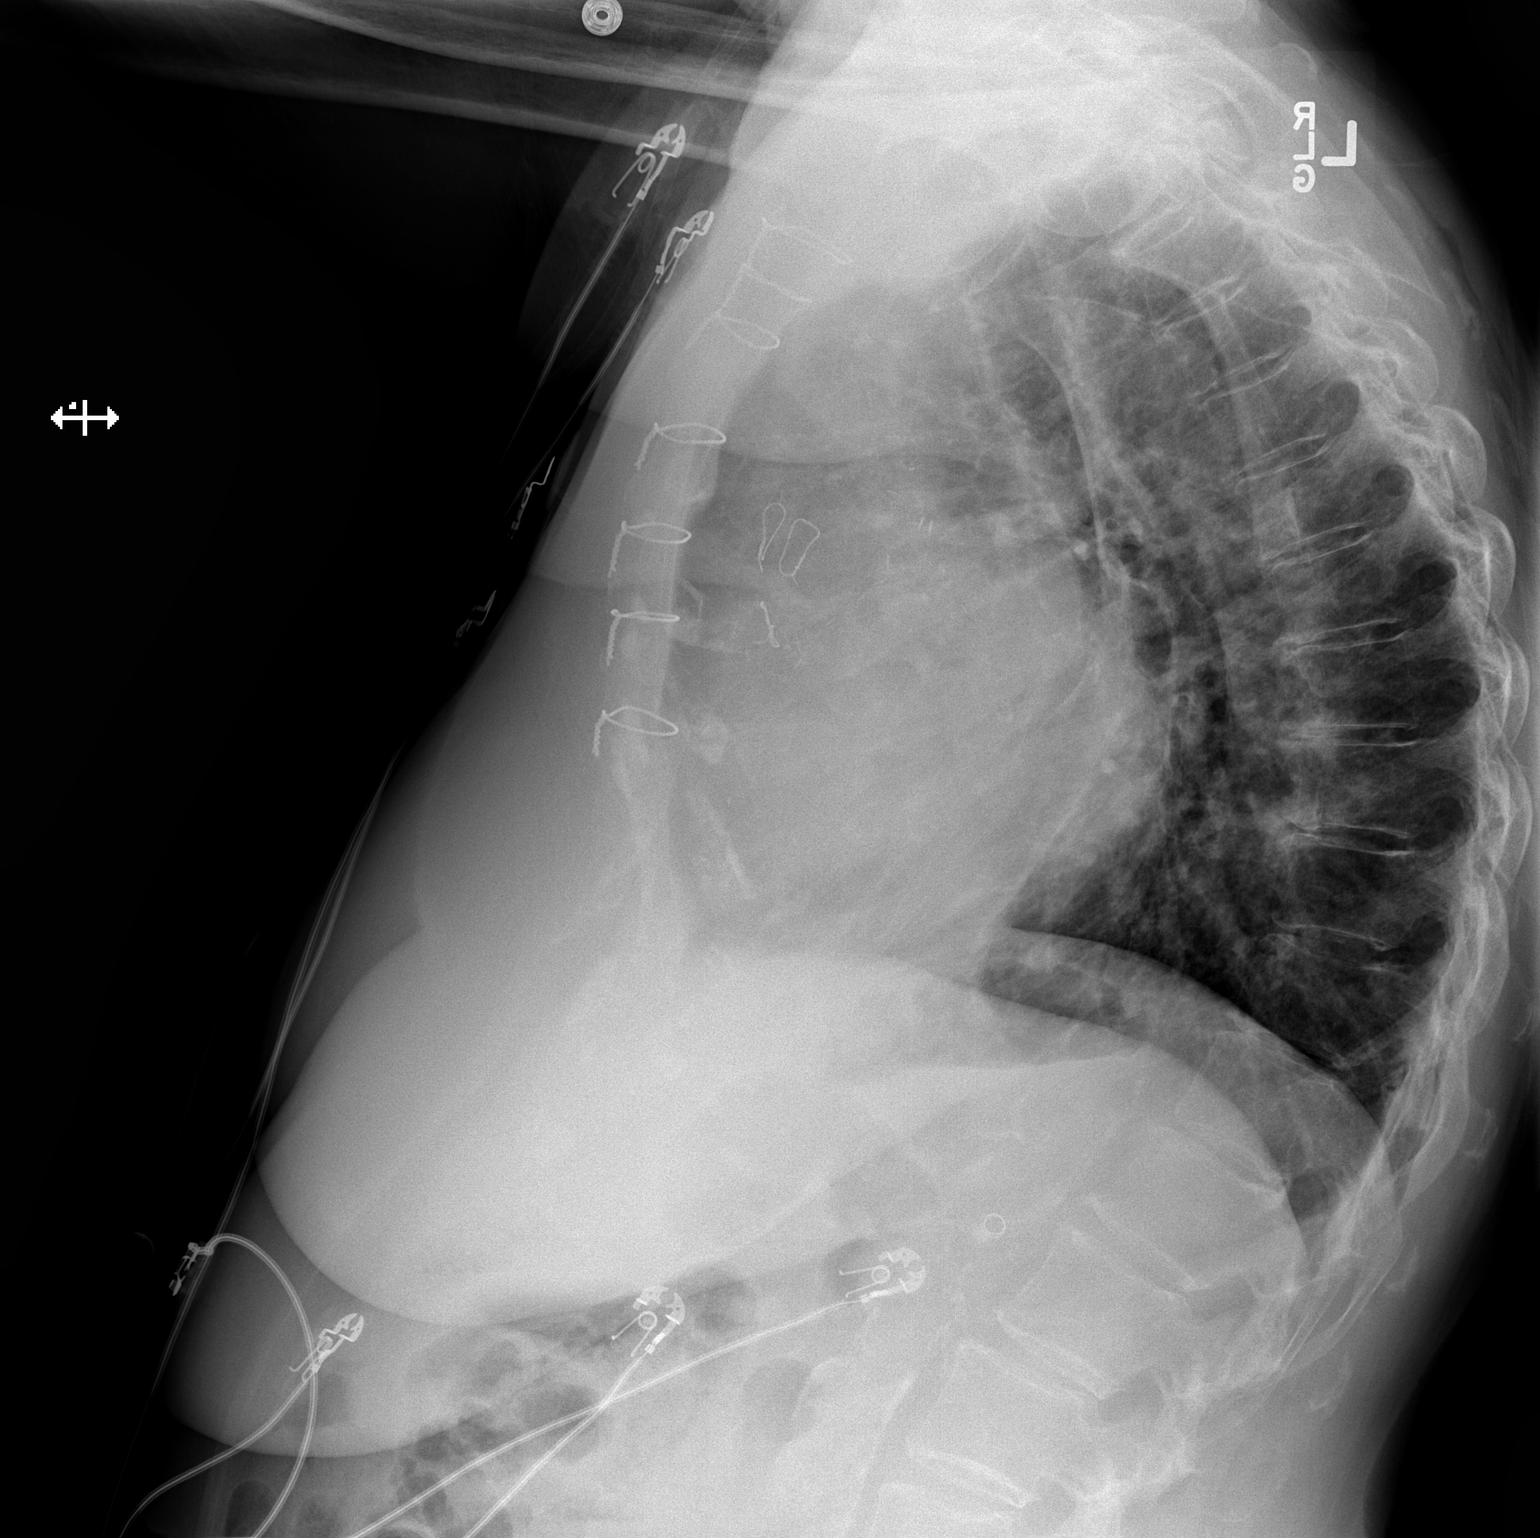

[2 of 2 positions shown; findings below may reference images not displayed]

FINDINGS: Mild enlargement of the cardiopericardial silhouette, with mild
cephalization of blood flow but no overt edema. No Kerley B-lines
observed.

Prior CABG.  Atherosclerotic calcification of the aortic arch.

Thoracic spondylosis.  No pleural effusion.
IMPRESSION: 1. Stable mild enlargement of the cardiopericardial silhouette, with
pulmonary venous hypertension but without overt edema.
2. Prior CABG.
3. Atherosclerotic aortic arch.

## 2017-05-13 IMAGING — CT CT ABD-PELV W/O CM
2 of 4 series · 17 of 46 positions shown, 19 images · non-contrast
Comparison: Plain films performed today.

CLINICAL DATA: Small bowel obstruction. Perforated viscus. Abnormal
x-rays appear

EXAM:
CT ABDOMEN AND PELVIS WITHOUT CONTRAST
TECHNIQUE: Multidetector CT imaging of the abdomen and pelvis was performed
following the standard protocol without IV contrast.

[Series 2: rtn a/p w/o · axial · non-contrast · 0.82mm/px · z∈[-518,-132]mm · 14 of 85 slices shown, 16 images]
[im 4/85  soft-tissue]
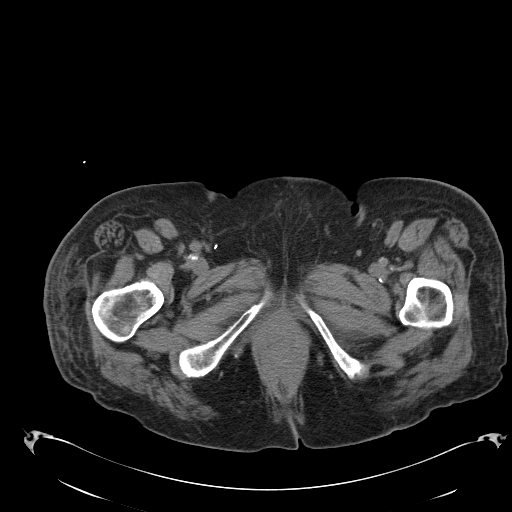
[im 4/85  bone]
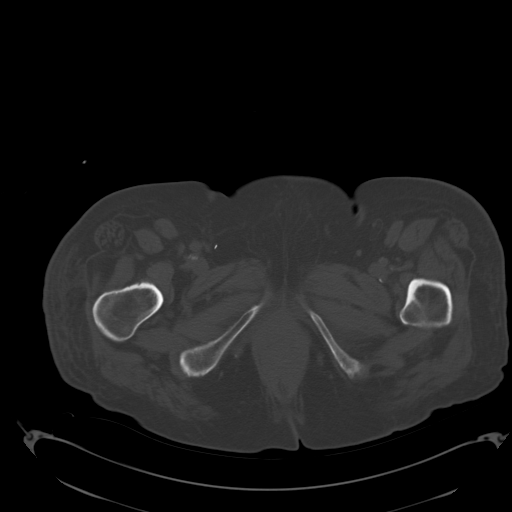
[im 11/85  soft-tissue]
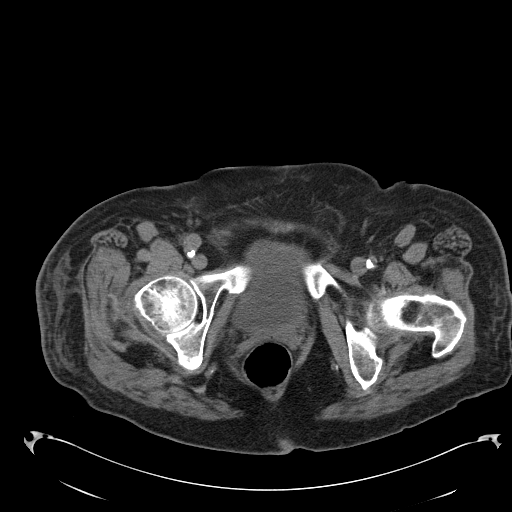
[im 18/85  soft-tissue]
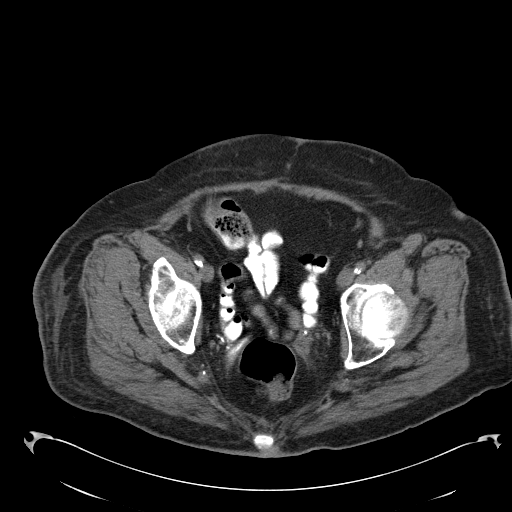
[im 22/85  soft-tissue]
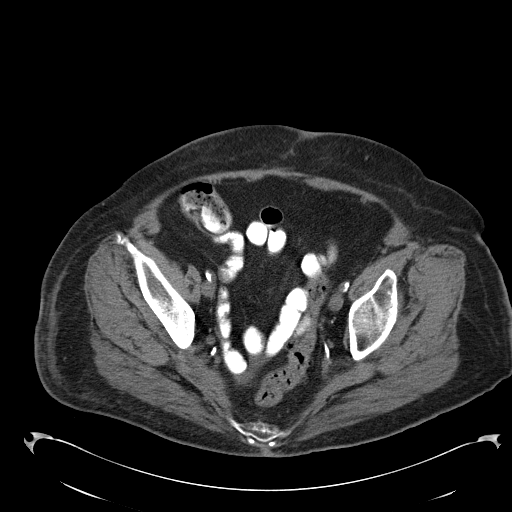
[im 29/85  soft-tissue]
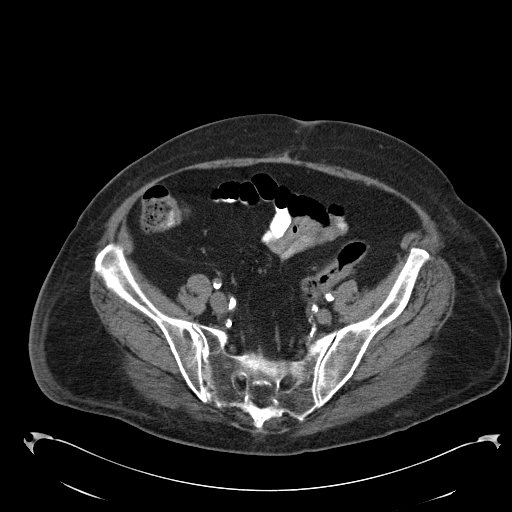
[im 36/85  soft-tissue]
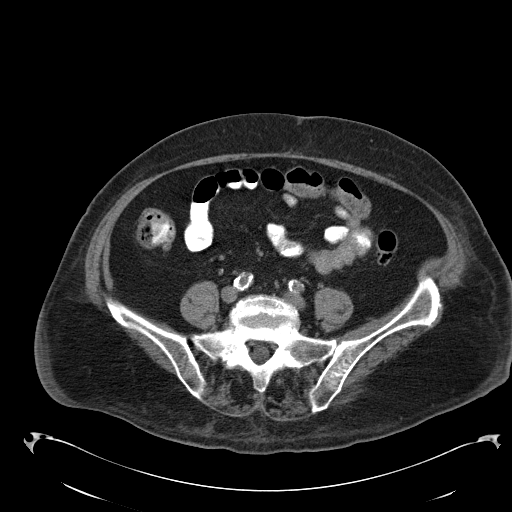
[im 39/85  soft-tissue]
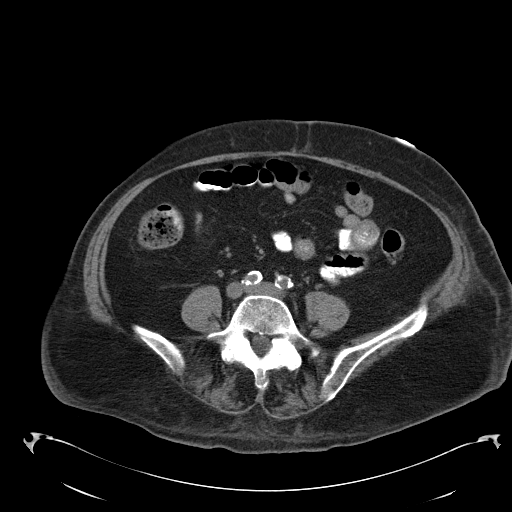
[im 46/85  soft-tissue]
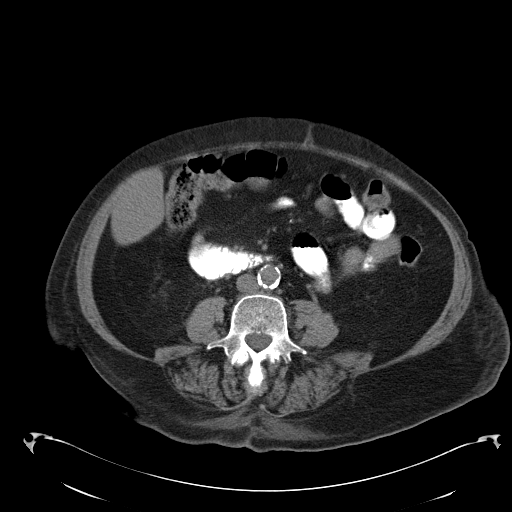
[im 50/85  soft-tissue]
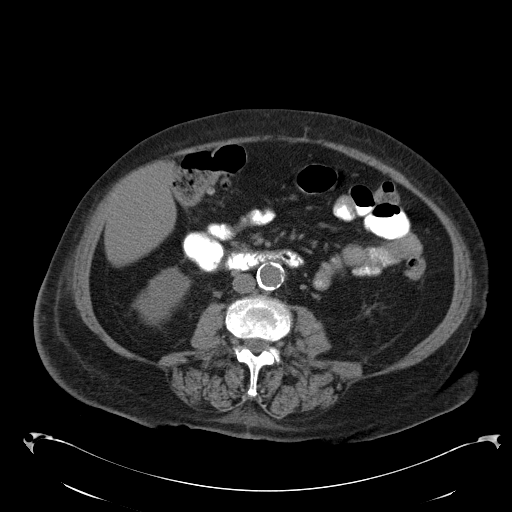
[im 50/85  bone]
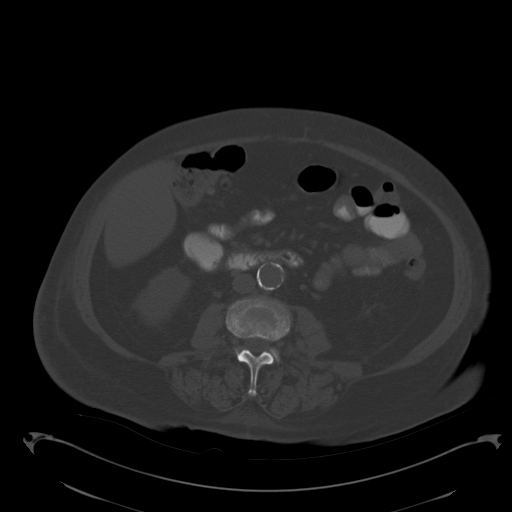
[im 57/85  soft-tissue]
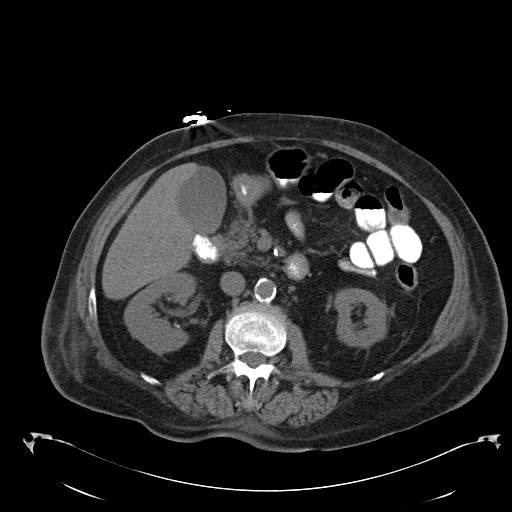
[im 64/85  soft-tissue]
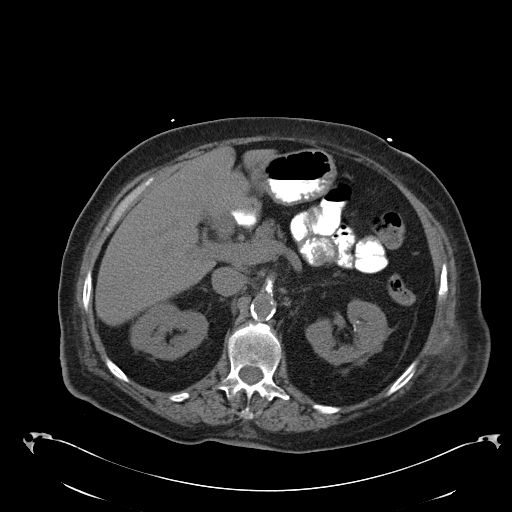
[im 67/85  soft-tissue]
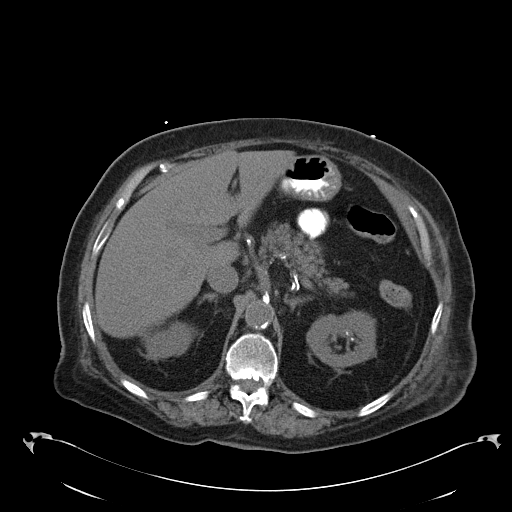
[im 74/85  soft-tissue]
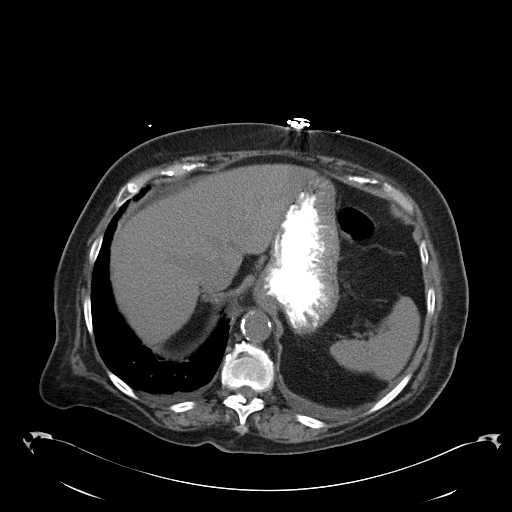
[im 81/85  soft-tissue]
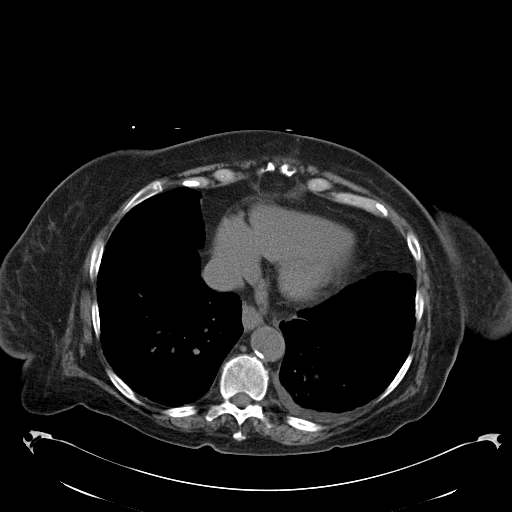

[Series 602: <mpr thick range> · coronal · 0.82mm/px · 3 of 104 slices shown]
[im 35/104  soft-tissue]
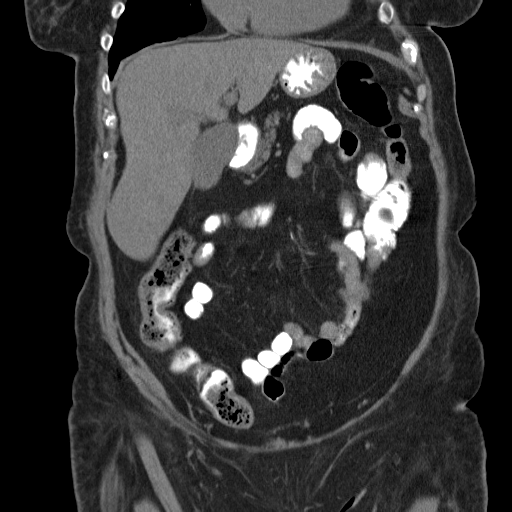
[im 46/104  soft-tissue]
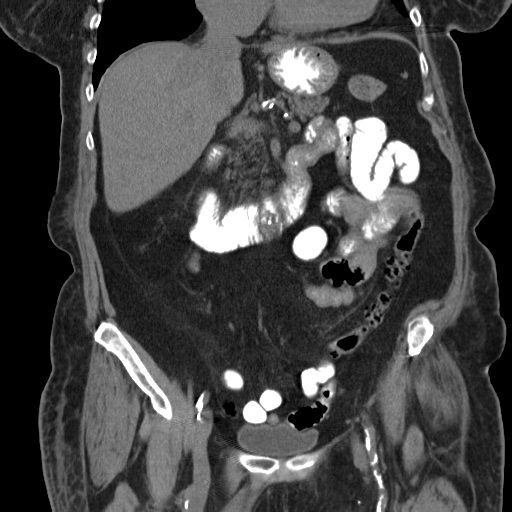
[im 58/104  soft-tissue]
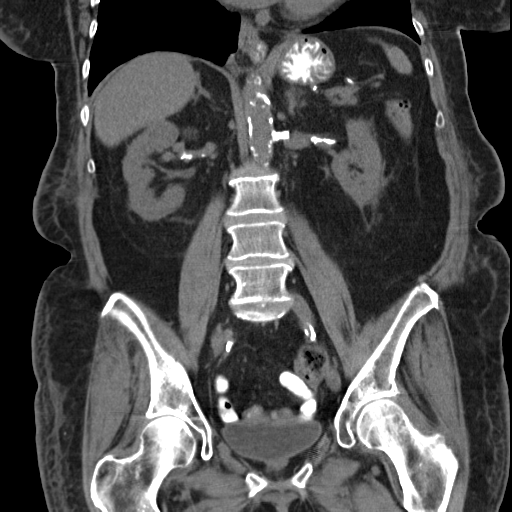

[17 of 46 positions shown; findings below may reference images not displayed]

FINDINGS: Mild cardiomegaly. Small left pleural effusion and trace right
effusion. Minimal dependent atelectasis in the lung bases.

Liver, gallbladder, spleen, pancreas, adrenals have an unremarkable
unenhanced appearance. Vascular calcifications in the renal hila
bilaterally. No ureteral stones or hydronephrosis. Urinary bladder
is unremarkable.

Scattered colonic diverticula.  No active diverticulitis.

The previously seen small bowel prominence is less evident by CT.
There is mild prominence of upper abdominal small bowel loops with
decompressed distal small bowel loops. Contrast material has made it
into the right side of the colon. I do not see any transition point.
No CT evidence for obstruction.

Aorta and iliac vessels are heavily calcified, non aneurysmal. Prior
hysterectomy. No adnexal masses. No free fluid, free air or
adenopathy.

No acute bony abnormality or focal bone lesion.
IMPRESSION: No CT evidence for small bowel obstruction. No evidence of free air
as questioned on plain films.

Scattered colonic diverticulosis.  No active diverticulitis.

Small left pleural effusion.  Trace right pleural effusion.

## 2017-05-13 IMAGING — DX DG ABDOMEN ACUTE W/ 1V CHEST
4 series · 4 of 4 positions shown · non-contrast
Comparison: Chest x-ray of 07/05/2015 and ultrasound of the abdomen
of 07/04/2015

CLINICAL DATA: Hypothermia, history of CABG, former smoking history

EXAM:
DG ABDOMEN ACUTE W/ 1V CHEST

[abdomen supine]
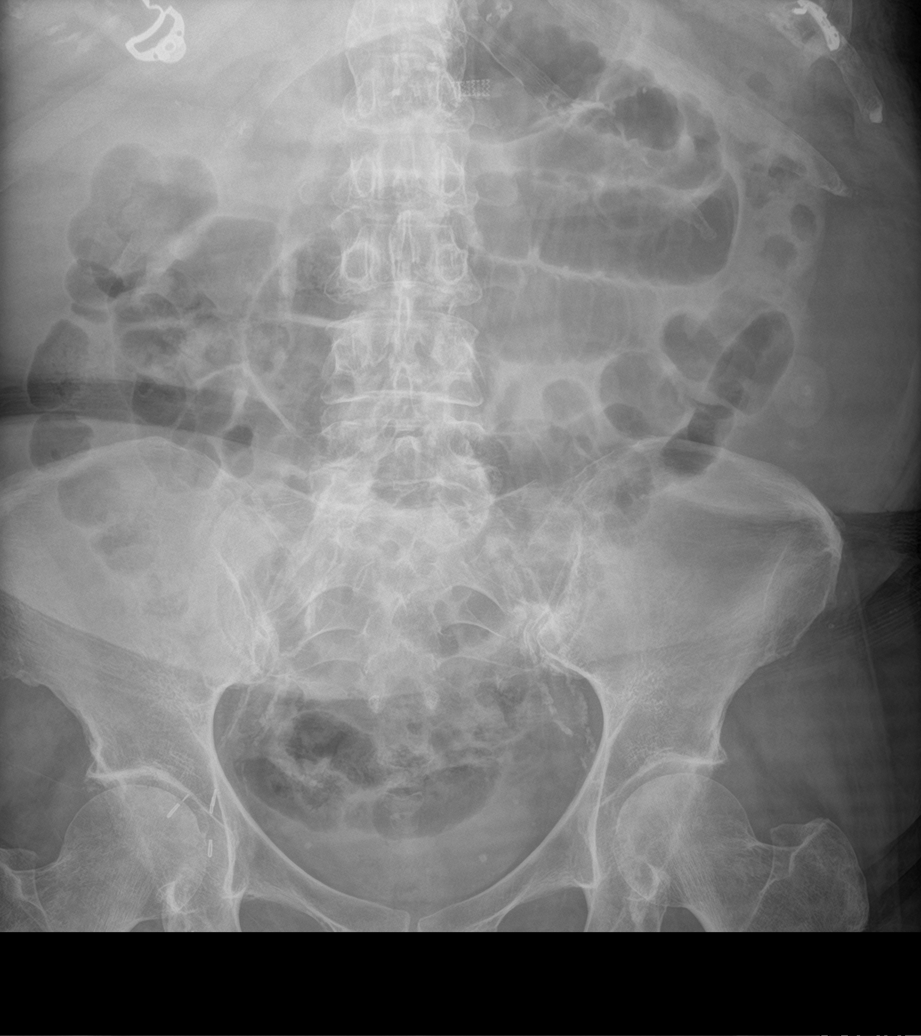

[abdomen decu (1 of 2)]
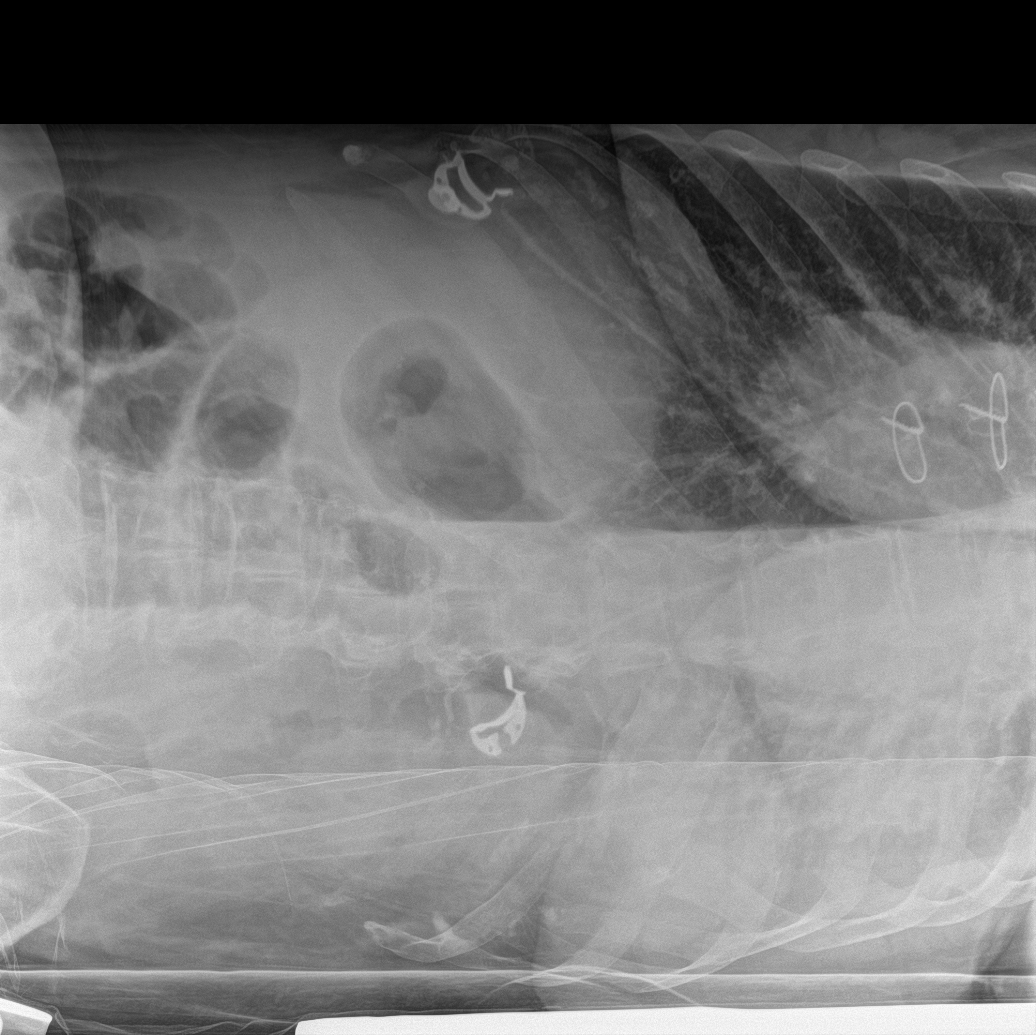

[chest ap]
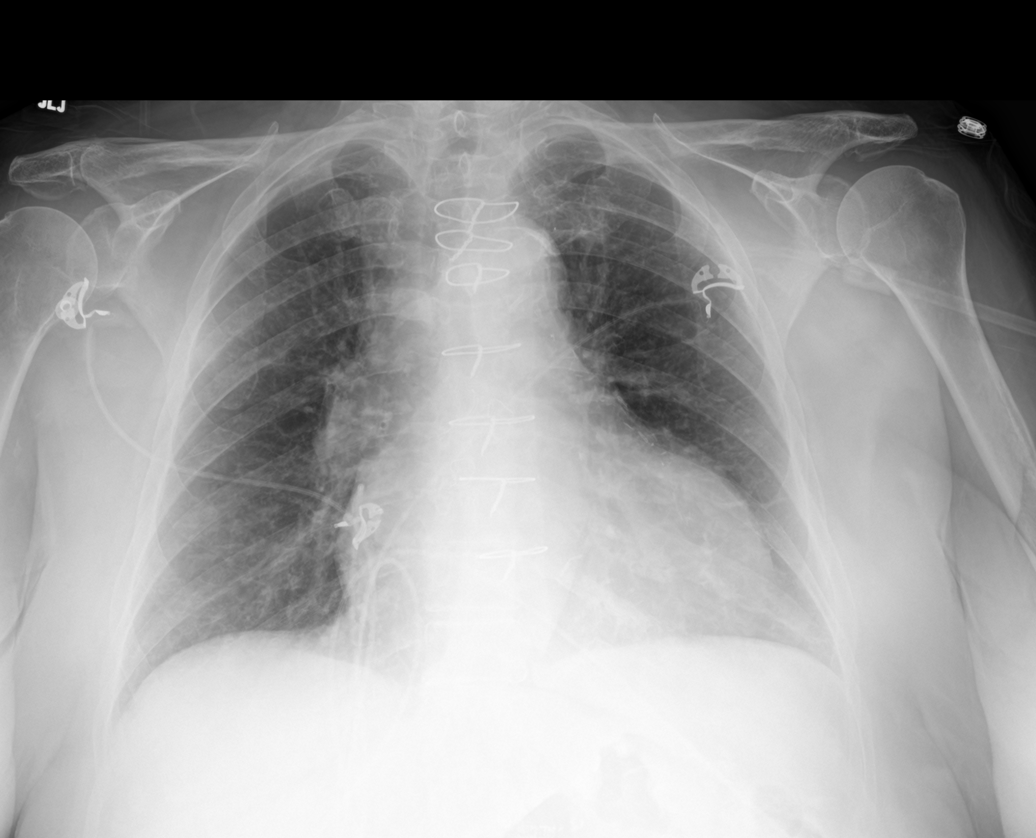

[abdomen decu (2 of 2)]
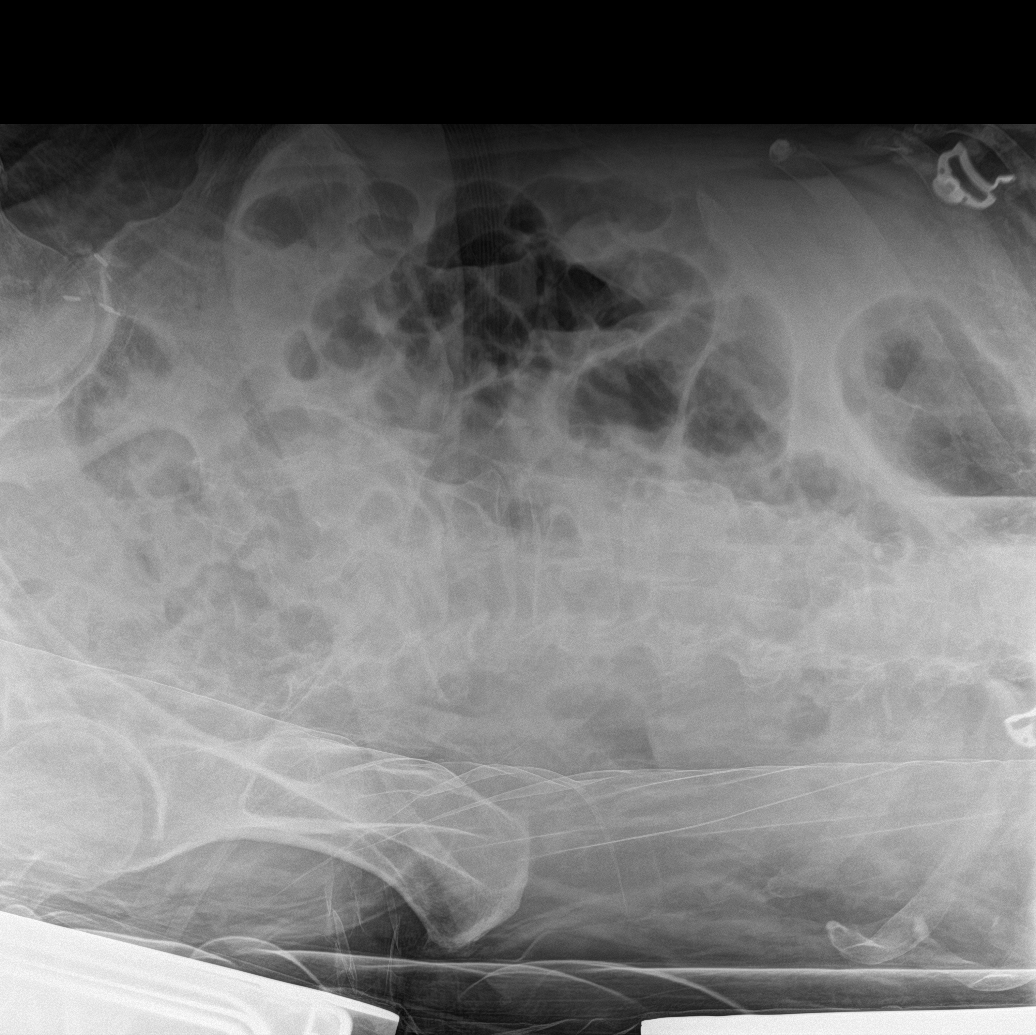

[4 of 4 positions shown; findings below may reference images not displayed]

FINDINGS: No active infiltrate or effusion is seen. Cardiomegaly is stable.
Median sternotomy sutures are noted from prior CABG.

Supine and left lateral decubitus films of the abdomen were
obtained. Better seen on the supine film there are somewhat
distended loops of small bowel, and the colon and distal small bowel
appears decompressed. A partial small bowel obstruction is a
definite consideration. On the left lower decubitus films, there is
some lucency just above the right lobe of liver, and a small amount
of free intraperitoneal air cannot be excluded. CT the abdomen
pelvis is recommended to assess for possible free air as well as
evaluate the possibility of partial small bowel obstruction. No
opaque calculi are seen. The bones are osteopenic.
IMPRESSION: 1. Prominent loops of small bowel may indicate a partial small bowel
obstruction.
2. Cannot exclude a small amount of free air on the decubitus film.
In view of these findings, CT of the abdomen pelvis at this time is
recommended.
3. No active lung disease.  Cardiomegaly.
I spoke with Dr. Derf Tanchon concerning the findings of this
